# Patient Record
Sex: Female | Born: 1935 | Race: White | Hispanic: No | Marital: Married | Smoking: Never smoker
Health system: Southern US, Community
[De-identification: ages and names within clinical notes are randomized; demographics above are authoritative.]

## PROBLEM LIST (undated history)

## (undated) DIAGNOSIS — I10 Essential (primary) hypertension: Secondary | ICD-10-CM

## (undated) DIAGNOSIS — Z974 Presence of external hearing-aid: Secondary | ICD-10-CM

## (undated) DIAGNOSIS — K219 Gastro-esophageal reflux disease without esophagitis: Secondary | ICD-10-CM

## (undated) DIAGNOSIS — Z9989 Dependence on other enabling machines and devices: Secondary | ICD-10-CM

## (undated) DIAGNOSIS — Z972 Presence of dental prosthetic device (complete) (partial): Secondary | ICD-10-CM

## (undated) DIAGNOSIS — E7800 Pure hypercholesterolemia, unspecified: Secondary | ICD-10-CM

## (undated) DIAGNOSIS — T84498A Other mechanical complication of other internal orthopedic devices, implants and grafts, initial encounter: Secondary | ICD-10-CM

## (undated) DIAGNOSIS — K209 Esophagitis, unspecified without bleeding: Secondary | ICD-10-CM

## (undated) DIAGNOSIS — R42 Dizziness and giddiness: Secondary | ICD-10-CM

## (undated) DIAGNOSIS — R29898 Other symptoms and signs involving the musculoskeletal system: Secondary | ICD-10-CM

## (undated) HISTORY — PX: TOE AMPUTATION: SHX809

## (undated) HISTORY — PX: TONSILLECTOMY: SUR1361

## (undated) HISTORY — DX: Esophagitis, unspecified without bleeding: K20.90

## (undated) HISTORY — PX: BACK SURGERY: SHX140

## (undated) HISTORY — PX: ABDOMINAL SURGERY: SHX537

## (undated) HISTORY — PX: JOINT REPLACEMENT: SHX530

## (undated) HISTORY — PX: CHOLECYSTECTOMY: SHX55

## (undated) HISTORY — PX: REPLACEMENT TOTAL KNEE: SUR1224

## (undated) SURGERY — ESOPHAGOGASTRODUODENOSCOPY (EGD) WITH PROPOFOL
Anesthesia: Choice

---

## 2002-12-25 ENCOUNTER — Other Ambulatory Visit: Payer: Self-pay

## 2004-03-31 ENCOUNTER — Ambulatory Visit: Payer: Self-pay | Admitting: Internal Medicine

## 2004-04-02 ENCOUNTER — Ambulatory Visit (HOSPITAL_COMMUNITY): Admission: RE | Admit: 2004-04-02 | Discharge: 2004-04-02 | Payer: Self-pay | Admitting: Neurosurgery

## 2004-06-26 ENCOUNTER — Emergency Department: Payer: Self-pay | Admitting: Emergency Medicine

## 2004-07-25 ENCOUNTER — Inpatient Hospital Stay (HOSPITAL_COMMUNITY): Admission: RE | Admit: 2004-07-25 | Discharge: 2004-07-29 | Payer: Self-pay | Admitting: Orthopedic Surgery

## 2004-07-29 ENCOUNTER — Ambulatory Visit: Payer: Self-pay | Admitting: Internal Medicine

## 2004-08-17 ENCOUNTER — Encounter (HOSPITAL_COMMUNITY): Admission: RE | Admit: 2004-08-17 | Discharge: 2004-09-16 | Payer: Self-pay | Admitting: Orthopedic Surgery

## 2004-09-16 ENCOUNTER — Encounter (HOSPITAL_COMMUNITY): Admission: RE | Admit: 2004-09-16 | Discharge: 2004-10-16 | Payer: Self-pay | Admitting: Orthopedic Surgery

## 2005-04-12 ENCOUNTER — Emergency Department: Payer: Self-pay | Admitting: Emergency Medicine

## 2005-04-19 ENCOUNTER — Ambulatory Visit: Payer: Self-pay | Admitting: Internal Medicine

## 2005-04-26 ENCOUNTER — Ambulatory Visit: Payer: Self-pay | Admitting: Internal Medicine

## 2005-05-02 ENCOUNTER — Ambulatory Visit: Payer: Self-pay | Admitting: Internal Medicine

## 2005-05-09 ENCOUNTER — Ambulatory Visit: Payer: Self-pay | Admitting: Gastroenterology

## 2006-05-01 ENCOUNTER — Ambulatory Visit: Payer: Self-pay | Admitting: Internal Medicine

## 2006-05-17 ENCOUNTER — Ambulatory Visit: Payer: Self-pay | Admitting: Urology

## 2006-08-10 ENCOUNTER — Inpatient Hospital Stay (HOSPITAL_COMMUNITY): Admission: RE | Admit: 2006-08-10 | Discharge: 2006-08-16 | Payer: Self-pay | Admitting: Neurosurgery

## 2006-08-15 ENCOUNTER — Ambulatory Visit: Payer: Self-pay | Admitting: Physical Medicine & Rehabilitation

## 2006-08-16 ENCOUNTER — Inpatient Hospital Stay (HOSPITAL_COMMUNITY)
Admission: RE | Admit: 2006-08-16 | Discharge: 2006-08-23 | Payer: Self-pay | Admitting: Physical Medicine & Rehabilitation

## 2007-06-06 ENCOUNTER — Encounter: Payer: Self-pay | Admitting: Internal Medicine

## 2007-06-06 ENCOUNTER — Ambulatory Visit: Payer: Self-pay | Admitting: Internal Medicine

## 2007-06-21 ENCOUNTER — Encounter: Payer: Self-pay | Admitting: Internal Medicine

## 2007-07-22 ENCOUNTER — Encounter: Payer: Self-pay | Admitting: Internal Medicine

## 2007-08-06 ENCOUNTER — Encounter: Admission: RE | Admit: 2007-08-06 | Discharge: 2007-08-06 | Payer: Self-pay | Admitting: Neurosurgery

## 2007-08-06 ENCOUNTER — Ambulatory Visit: Payer: Self-pay | Admitting: Podiatry

## 2007-08-09 ENCOUNTER — Inpatient Hospital Stay: Payer: Self-pay | Admitting: Internal Medicine

## 2007-08-17 ENCOUNTER — Encounter: Payer: Self-pay | Admitting: Internal Medicine

## 2007-08-21 ENCOUNTER — Encounter: Payer: Self-pay | Admitting: Internal Medicine

## 2007-09-21 ENCOUNTER — Encounter: Payer: Self-pay | Admitting: Internal Medicine

## 2007-10-09 ENCOUNTER — Encounter: Admission: RE | Admit: 2007-10-09 | Discharge: 2007-10-09 | Payer: Self-pay | Admitting: Neurosurgery

## 2008-11-17 ENCOUNTER — Ambulatory Visit: Payer: Self-pay | Admitting: Internal Medicine

## 2009-11-08 ENCOUNTER — Ambulatory Visit: Payer: Self-pay | Admitting: Gastroenterology

## 2009-11-09 ENCOUNTER — Ambulatory Visit: Payer: Self-pay | Admitting: Gastroenterology

## 2009-11-09 LAB — PATHOLOGY REPORT

## 2009-11-20 ENCOUNTER — Ambulatory Visit: Payer: Self-pay | Admitting: Oncology

## 2009-12-10 ENCOUNTER — Ambulatory Visit: Payer: Self-pay | Admitting: General Surgery

## 2009-12-15 ENCOUNTER — Inpatient Hospital Stay: Payer: Self-pay | Admitting: General Surgery

## 2010-01-03 ENCOUNTER — Ambulatory Visit: Payer: Self-pay | Admitting: Oncology

## 2010-01-20 ENCOUNTER — Ambulatory Visit: Payer: Self-pay | Admitting: Oncology

## 2010-02-09 ENCOUNTER — Ambulatory Visit: Payer: Self-pay | Admitting: General Surgery

## 2010-03-18 ENCOUNTER — Ambulatory Visit: Payer: Self-pay | Admitting: General Surgery

## 2010-03-29 ENCOUNTER — Ambulatory Visit: Payer: Self-pay | Admitting: General Surgery

## 2010-06-02 ENCOUNTER — Ambulatory Visit: Payer: Self-pay | Admitting: Internal Medicine

## 2010-07-05 NOTE — Op Note (Signed)
[name], [name]              ACCOUNT NO.:  [id number]   MEDICAL RECORD NO.:  [id number]          PATIENT TYPE:  INP   LOCATION:  3172                         FACILITY:  [facility]   PHYSICIAN:  Payton Doughty, M.D.      DATE OF BIRTH:  Jan 04, 1933   DATE OF PROCEDURE:  08/10/2006  DATE OF DISCHARGE:                               OPERATIVE REPORT   PREOPERATIVE DIAGNOSIS:  Lumbar spondylosis   POSTOPERATIVE DIAGNOSIS:  Lumbar spondylosis.   OPERATIVE PROCEDURE:  L2-S1 total laminectomy; left-sided facetectomy at  L2-3, L3-4; segmental pedicle screw fixation from L2-S1; posterolateral  arthrodesis, L2-S1.   ANESTHESIA:  General endotracheal   PREP:  Betadine prep with alcohol wipe.   COMPLICATIONS:  Were dural lacerations which were repaired.   ASSISTANT:  Nurse assistant Basilia Jumbo, doctor assistant Venetia Maxon.   BODY OF TEXT:  This is a 77 year old layer lady with severe lumbar  spondylosis.  The planned procedure was decompression and interbody  fixation from L2-S1 followed by T9-S1 pedicle screw fixation.   The patient was taken to operative room, smoothly anesthetized,  intubated, and placed prone on the operating table.  Following shave,  prep and drape in the usual sterile fashion, skin was infiltrated with  1% lidocaine, and skin was incised from the bottom of L1 to S1, and the  lamina and transverse processes of L2, L3, L4, L5, S1 and the sacral ala  were exposed bilaterally in subperiosteal plane.  Intraoperative x-ray  confirmed correct level.  Starting at L2, the pars interarticularis  lamina and inferior facets of L2 and L3 were removed initially and  dissection was carried out.  It became obvious that adequate  decompression was not going to be obtained via this approach.  Therefore, total laminectomy of L2, L3, L4, and L5 was carried out with  dissection of the redundant ligamentum flavum.  Removal of the large  facet joints that were compressing the nerve roots was  accomplished with  a high-speed drill.  At L3-4 in the midline, there was a dural opening  when removing ligamentum flavum.  This was repaired primarily with 6-0  Prolene.  On the right side at L4-5, there was another dural laceration  that was quite large and quite lateral.  It was reapproximated with 6-0  Prolene, covered with Duragen and as well as Tisseel.  To Valsalva,  there was no leak.  Following complete decompression, it was obvious  that interbody devices could not be placed because of the ankylosis  across the disk spaces and because of inability to access the lateral  portion of the disk, because of the very thin nature of the patient's  dura.  It was therefore elected to complete the decompression and place  pedicle screws from L2 to S1.  The pedicle screws were placed without  difficulty.  Intraoperative x-ray showed good placement of screws.  They  were attached to the rods and capped.  The portion of the case that was  extended up to T9 was not carried out because of the significant  ankylosis and auto fusion of the facette joints  rendered further fusion  superfluous.  The wound was therefore irrigated.  The transverse process  and sacral ala decorticated with a high-  speed drill and packed with BMP on the extender matrix and the patient's  own bone.  Successive layers of 0 Vicryl, 2-0 Vicryl, 3-0 nylon were  used to close, and Betadine Telfa dressing was applied.  The patient  returned to the recovery room in good condition.           ______________________________  Payton Doughty, M.D.     MWR/MEDQ  D:  08/10/2006  T:  08/11/2006  Job:  [id number]

## 2010-07-05 NOTE — H&P (Signed)
NAMEKATLIN, [name]              ACCOUNT NO.:  [id number]   MEDICAL RECORD NO.:  [id number]          PATIENT TYPE:  INP   LOCATION:  3172                         FACILITY:  [facility]   PHYSICIAN:  Payton Doughty, M.D.      DATE OF BIRTH:  [birth year]   DATE OF ADMISSION:  08/10/2006  DATE OF DISCHARGE:                              HISTORY & PHYSICAL   ADMISSION DIAGNOSIS:  Lumbar spondylosis with significant spinal cord  compression.  She has scoliosis at 2-3, 3-4 and 4-5.   HISTORY OF PRESENT ILLNESS:  This is a very nice, 77 year old, right-  handed, white lady I visited with several years ago.  She has severe  spinal stenosis.  She has gotten her knee replaced and reported back  with severe complaints in the lower extremities.  She cannot walk  without a cane.  She cannot get around well with one.  Her symptoms are  those of neurogenic claudication.   PAST MEDICAL HISTORY:  Remarkable for hypertension.   MEDICATIONS:  Cozaar, diclofenac, Lipitor, gemfibrozil, aspirin and  hydrocodone.   ALLERGIES:  TETRACYCLINE.   PHYSICAL EXAMINATION:  HEENT:  Exam within normal limits.  NECK:  She has good range of motion in her neck.  CHEST:  Clear.  CARDIAC:  Exam is regular rate and rhythm.  ABDOMEN:  Slightly large but nontender with no hepatosplenomegaly.  EXTREMITIES:  Without clubbing or cyanosis.  GU:  Exam deferred.  PULSES:  Peripheral pulses are good.  NEUROLOGIC:  She is awake, alert and oriented.  Cranial nerves are  intact.  Motor exam shows 5/5 strength in the lower extremities on  confrontational testing.  Sensory dysesthesias described in an L3, L4  and L5 distribution.  Reflexes are absent in the lower extremities.   She comes in accompanied with an MRI which demonstrates severe spinal  stenosis at L1-2, L2-3, L3-4 and L5-S1, slightly tight at 5-1.  There is  scoliosis at 2-3, 3-4 and 4-5.   CLINICAL IMPRESSION:  Lumbar spondylosis which is severe with neurogenic  claudication.   PLAN:  The plan is to fuse from L2-3 to L5-S1 with pedicle screws and  cages, and then from T9 down to L2 also with pedicle screws.  The risks  and benefits of this approach have been discussed with her and she  wished to proceed.           ______________________________  Payton Doughty, M.D.     MWR/MEDQ  D:  08/10/2006  T:  08/10/2006  Job:  [id number]

## 2010-07-05 NOTE — Discharge Summary (Signed)
NAMEKIMILA, PAPALEO              ACCOUNT NO.:  [id number]   MEDICAL RECORD NO.:  [id number]          PATIENT TYPE:  IPS   LOCATION:  4025                         FACILITY:  [facility]   PHYSICIAN:  Ranelle Oyster, M.D.DATE OF BIRTH:  17-Mar-1932   DATE OF ADMISSION:  08/16/2006  DATE OF DISCHARGE:  08/23/2006                               DISCHARGE SUMMARY   DISCHARGE DIAGNOSES:  1. Lumbar spondylosis status post lumbar L2-S1 laminectomy August 10, 2006, pain management.  2. Non-insulin dependent diabetes mellitus.  3. Postoperative anemia.  4. Hyponatremia.  5. Hyperlipidemia.  6. Hypertension.  7. History of a right total knee replacement 2006.   HISTORY OF PRESENT ILLNESS:  This is a 77 year old right-handed white  female admitted June 20, admitted with progressive low back pain  radiating to the lower extremities.  X-rays and imaging showed advanced  lumbar spondylosis lumbar L2-S1.  Underwent lumbar L2-S1 total  laminectomy left-sided fasciotomy at lumbar L2-3 L3-4, August 10, 2006 per  Dr. Channing Mutters.  Back brace when out of bed.  Postoperative anemia 6.1,  transfused hemoglobin improved to 10.6. Cipro added June 26, for wound  coverage.   PAST MEDICAL HISTORY:  See discharge diagnoses.  No alcohol or tobacco.   ALLERGIES:  TETRACYCLINE.   SOCIAL HISTORY:  Lives with husband in one level home, two steps to  entry. Husband with prostate cancer but can assist.   MEDICATIONS PRIOR TO ADMISSION:  1. Diclofenac 75 mg twice daily.  2. Hydrocodone as needed.  3. Lipitor 80 mg daily.  4. Lopid 600 mg twice daily.  5. Hyoscyamine 0.375 mg twice daily as needed.  6. Actos 45 mg daily.  7. Cozaar 100 mg daily.   REHABILITATION HOSPITAL COURSE:  The patient was admitted to inpatient  rehab services with therapies initiated on a 3-hour daily basis  consisting of physical therapy, occupational therapy and rehabilitation  nursing. The following issues were addressed during the  patient's  rehabilitation stay.  Pertaining to Mrs. Royer lumbar spondylosis with  lumbar L2-S1 laminectomy August 10, 2006, surgical site healing nicely.  She was on Cipro through August 23, 2006, for wound coverage.  She was to  be wearing a back brace when out of bed.  Functionally she was  ambulating household distances with an assistive device.  Pain control  ongoing with the use of oxycodone. She had been on Celebrex but this was  held.  Blood sugars with fair control on Actos 45 mg daily.  Postoperative anemia.  Latest hemoglobin of 10, hematocrit 30.  She  remained on iron supplement.  Mild hyponatremia 128, 132, close  monitoring of intake and output.  She had some mild constipation that  was resolved with laxative assistance.  Blood pressures monitored with  Cozaar 100 mg daily.  She will continue on her Lipitor and Lopid for  hyperlipidemia.   Latest labs showed a sodium 134, potassium 3.4, BUN 9, creatinine 0.7.  Latest hemoglobin of 10, hematocrit 30.4.   DISCHARGE MEDICATIONS:  At time of dictation included  1. Lipitor 80 mg daily.  2.  Lopid 600 mg twice daily.  3. Cozaar 100 mg daily.  4. Actos 45 mg daily.  5. Trinsicon 1 capsule twice daily.  6. MiraLax 17 grams daily with 8 ounces of water.  7. Oxycodone 5 mg one or two tablets every 4 hours as needed pain      dispense of 90 tablets.   DIET:  Was diabetic diet.   WOUND CARE:  Cleanse incision daily with soap and water, monitor for any  increased redness, drainage or fever. Follow up with Dr. Trey Sailors a [phone], neurosurgery, Dr. Judithann Sheen medical management.   SPECIAL INSTRUCTIONS:  Back brace when out of bed.      Mariam Dollar, P.A.      Ranelle Oyster, M.D.  Electronically Signed    DA/MEDQ  D:  08/22/2006  T:  08/23/2006  Job:  [id number]   cc:   Payton Doughty, M.D.  Aram Beecham, M.D.

## 2010-07-05 NOTE — H&P (Signed)
NAMEDOROTEA, HAND              ACCOUNT NO.:  [id number]   MEDICAL RECORD NO.:  [id number]          PATIENT TYPE:  IPS   LOCATION:  4025                         FACILITY:  [facility]   PHYSICIAN:  Ellwood Dense, M.D.   DATE OF BIRTH:  06-Sep-1932   DATE OF ADMISSION:  08/16/2006  DATE OF DISCHARGE:                              HISTORY & PHYSICAL   HISTORY OF PRESENT ILLNESS:  The patient is a 77 year old, right-handed,  Caucasian female admitted 08/10/2006, with history of right total knee  replacement in 2006. She was admitted for progressive low back pain with  radiating lower-extremity symptoms. X-rays and imaging had shown  advanced lumbar spondylosis at L2-S1. The patient underwent L2-S1 total  laminectomy along with left-sided facetectomy at L2-3 and L3-4 performed  08/10/06, by Dr. Channing Mutters. The patient was placed in a back brace when out of  bed. She was allowed to don and doff the brace at the edge of the bed.  Postoperative anemia developed with hemoglobin of 6.1. She was  transfused 2 units of packed red blood cells with improvement to 10.6.  Ciprofloxacin was added 08/16/06, for wound coverage. She continues to  have a Foley in place.   The patient was evaluated by the rehabilitation physicians and felt to  be an appropriate candidate for inpatient rehabilitation.   REVIEW OF SYSTEMS:  Positive for anxiety and lumbago.   PAST MEDICAL HISTORY:  1. Hypertension.  2. Noninsulin dependent diabetes mellitus/  3. Dyslipidemia.  4. Anxiety.  5. Right total knee replacement, 2006.  6. Prior cholecystectomy.   FAMILY HISTORY:  Positive for coronary artery disease.   SOCIAL HISTORY:  The patient lives with her husband who can assist as  needed. Her husband has been treated for prostate cancer but is  functionally able to help her. They lives in a 1-level home with 2 steps  to enter. The patient does not use alcohol or tobacco.   FUNCTIONAL HISTORY PRIOR TO ADMISSION:   Independent.   ALLERGIES:  TETRACYCLINE.   MEDICATIONS:  Prior to admission  1. Diclofenac 75 mg b.i.d.  2. Hydrocodone p.r.n.  3. Lipitor 40 mg 2 tablets daily.  4. Lopid 600 mg b.i.d.  5. Hyoscine 0.375 mg b.i.d. p.r.n.  6. Actos 45 mg.  7. Cozaar 100 mg daily.   LABORATORY DATA:  Recent hemoglobin was 10.6 with hematocrit of 31.6,  platelet count of 96,000 and white count of 8.6. Recent sodium was 136,  potassium 3.8, chloride 108, CO2 24, BUN 11 and creatinine 0.8.   PHYSICAL EXAMINATION:  GENERAL APPEARANCE: Reasonably well-appearing,  elderly, large adult female lying in bed in mild to no acute discomfort.  VITAL SIGNS: Blood pressure 120/70 with a pulse of  95, respiratory rate  18 and temperature 99.0.  HEENT: Normocephalic, atraumatic.  CARDIOVASCULAR: Regular rate and rhythm. S1 and S2 without murmurs.  ABDOMEN: The abdomen was soft, obese, nontender with positive bowel  sounds.  LUNGS: The lungs were clear to auscultation bilaterally.  NEUROLOGIC: Alert and oriented x3. Cranial nerves II through XII are  intact.  EXTREMITIES: Bilateral upper-extremity exam showed 4+/5  strength  throughout. Bulk and tone were normal and reflexes were 2+ and  symmetrical. Sensation was intact to light touch to the bilateral upper  extremities. Lower-extremity exam showed hip flexion, extension, ankle  dorsiflexion at 3-/5. Bulk and tone were normal. Reflexes were decreased  in the bilateral lower extremities. Sensation was intact.  BACK: Examination of her lumbar area showed well-healing wound with dry  dressing.   IMPRESSION:  1. Status post lumbar laminectomy/facetectomy for lumbar spondylosis      with myelopathy L2-S1 performed 08/10/06, by Dr. Channing Mutters.  2. Postop pain control using oxycodone immediate release and Celebrex      200 mg b.i.d.  3. Noninsulin dependent diabetes mellitus on Actos 45 mg daily and      high-carbohydrate modified diet.  4. Dyslipidemia on Lipitor and  Lopid daily.  5. Hypertension on Cozaar 100 mg orally daily.   Presently, the patient has deficits in ADLs, transfers and ambulation  related to the above-noted lumbar laminectomy and facetectomy.   PLAN:  1. Admit to the rehabilitation unit for daily physical therapy for      range of motion, strengthening, bed mobility, transfers, pre-gait      training, gait training and equipment eval.  2. Occupational therapy for range of motion, strengthening, ADLs,      cognitive/perceptual training, splinting and equipment eval.  3. Rehab nursing for skin care, wound care and bowel and bladder      training as necessary.  4. Case management to assess home environment, assist with discharge      planning, arrange for appropriate follow-up care.  5. Social worker to assess family and social support and assist in      discharge planning.  6. Continue high-carbohydrate modified diet with CPGs a.c. and at      bedtime, Actos 45 mg orally daily.  7. Lipitor 80 mg orally daily with Lopid 600 mg orally b.i.d.  8. Check admission lab including CBC and __________  Friday, August 17, 2006.  9. Binder and Hypafix tape b.i.d. to the back incision, change twice a      day.  10.DC Foley in a.m. 08/17/06.  11.Continue ciprofloxacin 500 mg orally q. 12 h x 7 days.  12.Routine turning to prevent skin breakdown.  13.Levsinex 0.375 mg orally b.i.d. p.r.n.  14.Senokot S 2 tablets orally at bedtime.  15.Monitor hypertension on Cozaar 100 mg orally daily.  16.DC IV fluids if not already done.  17.The patient can don and doff back brace at the edge of the bed and      the patient to have the back brace on when out of bed.  18.Hand-held nebulizer with albuterol 2.5 mg q.i.d. p.r.n.  19.Oxycodone 5 mg 1 to 2 tablets orally q. 4 h p.r.n. for pain in      addition to above-noted Celebrex.   PROGNOSIS:  Good.   ESTIMATED LENGTH OF STAY:  Seven to twelve days.  GOALS:  Modified independent, ADLs, transfers and  ambulation household  distances.           ______________________________  Ellwood Dense, M.D.     DC/MEDQ  D:  08/16/2006  T:  08/16/2006  Job:  [id number]

## 2010-07-08 NOTE — Op Note (Signed)
NAMEROSINA, [name]              ACCOUNT NO.:  [id number]   MEDICAL RECORD NO.:  [id number]          PATIENT TYPE:  INP   LOCATION:  1517                         FACILITY:  [facility]   PHYSICIAN:  [name], M.D.    DATE OF BIRTH:  08-21-1932   DATE OF PROCEDURE:  07/25/2004  DATE OF DISCHARGE:                                 OPERATIVE REPORT   PREOPERATIVE DIAGNOSIS:  Osteoarthritis, right knee.   POSTOPERATIVE DIAGNOSIS:  Osteoarthritis, right knee.   PROCEDURE:  Right total knee arthroplasty.   SURGEON:  [name], M.D.   ASSISTANT:  Alexzandrew L. Julien Girt, P.A.   ANESTHESIA:  General with postop Marcaine pain pump.   ESTIMATED BLOOD LOSS:  Minimal.   DRAINS:  Hemovac x1.   TOURNIQUET TIME:  44 minutes at 300 mmHg.   COMPLICATIONS:  None.   CONDITION:  Stable to recovery.   CLINICAL NOTE:  [name] is a 77 year old female with end-stage  osteoarthritis of both knees, right more symptomatic than the left. She has  failed nonoperative management and presents now for total knee arthroplasty.   PROCEDURE IN DETAIL:  After successful administration of general anesthetic,  a tourniquet was placed high on the right thigh, right lower extremity  prepped and draped in the usual sterile fashion. Extremities wrapped in  Esmarch, knee flexed and tourniquet inflated at 300 mmHg. Standard midline  incision made with a 10 blade through subcutaneous tissue to the level of  the extensor mechanism. Fresh blade is used to make a medial parapatellar  arthrotomy and the soft tissue of the proximal medial tibia is  subperiosteally elevated to the joint line with the knife and into the  semimembranosus bursa with a Cobb elevator. Soft tissue of the proximal  lateral tibia is also elevated with attention being paid to avoid the  patellar tendon on tibial tubercle. Patella is everted, knee flexed 90  degrees. ACL and PCL removed. Drill was used to create a starting hole in  the  distal femur and the canal was irrigated. Five degrees right valgus  alignment guide was placed and, referencing off the posterior condyles,  rotations marked and the block pinned to remove 10 mm of the distal femur.  Distal femoral resection is made with an oscillating saw. Sizing blocks  placed and a size 3 is most appropriate. Rotations marked off the  epicondylar axis. Size 3 cutting block is placed and then the anterior,  posterior and chamfer cuts were made.   Tibia was then subluxed forward and the menisci removed. Extramedullary  tibial alignment guides placed referencing proximally at the medial aspect  of the tibial tubercle and distally along the second metatarsal axis and  tibial crest. The block is pinned to remove 10 mm off the proximal tibia. We  used the lateral side as our reference. The tibial resection is made with an  oscillating saw. A 2.5 is the most appropriate size for the tibial component  and the proximal tibia was prepared with the modular drill and keel punch  for size 2.5. Femoral preparation was completed with the intercondylar cut  for the size 3.   The size 2.5 mobile bearing tibial trial, size 3 posterior stabilized  femoral trial, and a  10 mm posterior stabilized rotating platform insert  trial are placed. With the 10, full extension is achieved with excellent  varus and valgus balance throughout full range of motion. The patella was  then everted and thickness measured to be 22 mm. Freehand resection is taken  to 13 mm, 35 template is placed, lug holes were drilled, trial patella was  placed and it tracks normally. The osteophytes were then removed off the  posterior femur with the trial in place. All trials were removed and the cut  bone surfaces are prepared with pulsatile lavage. Cement is mixed and once  ready for implantation, a size 2.5 mobile bearing tibial tray, size 3  posterior stabilized femur, and 35 patella are cemented into place.  Patella  was held the clamp. Trial 10 mm inserts placed and knee held in full  extension and all extruded cement removed. Once the cement fully hardened,  then a permanent 10 mm posterior stabilized rotating platform insert is  placed with the tibial tray. The wound was copiously irrigated with saline  solution. The extensor mechanism closed over Hemovac drain with interrupted  #1 PDS. Flexion against gravity is 130 degrees. Tourniquets is released  after a total time of 44 minutes. Subcu is then closed with interrupted 2-0  Vicryl. Catheter for Marcaine pain pump was placed and the pump initiated.  Subcuticular was closed with running 4-0 Monocryl and the incisions cleaned  and dried and Steri-Strips and bulky sterile dressing applied. Drains hooked  to suction. She is placed into a knee immobilizer, awakened and transported  to recovery in stable condition.       FA/MEDQ  D:  07/25/2004  T:  07/25/2004  Job:  [id number]

## 2010-07-08 NOTE — H&P (Signed)
[name], [name]              ACCOUNT NO.:  [id number]   MEDICAL RECORD NO.:  [id number]          PATIENT TYPE:  INP   LOCATION:  NA                           FACILITY:  [facility]   PHYSICIAN:  Ollen Gross, M.D.    DATE OF BIRTH:  Dec 05, 1932   DATE OF ADMISSION:  07/25/2004  DATE OF DISCHARGE:                                HISTORY & PHYSICAL   CHIEF COMPLAINT:  Right knee pain.   HISTORY OF PRESENT ILLNESS:  The patient is a 77 year old female referred  over by Dr. Trey Sailors for bilateral knee pain. Ms. Hooper is a 77 year old  female who has had bilateral knee pain, right more symptomatic than left.  She has been followed by Dr. Channing Mutters for a lumbar stenosis but has also had knee  problems for some time now. She feels like her knees are the main thing  preventing her from doing what she would like to do. She has lost some  mobility and unable to walk distances, both because of the spinal stenosis  and her knee problems. The right is more symptomatic. She is seen in the  office by Dr. Lequita Halt and found to have severe end stage arthritic changes  with tri-compartmental changes in the right knee with bone-on-bone. It has  been progressive in nature and it is felt that she could benefit from  undergoing knee replacement. Risks and benefits have been discussed. The  patient is subsequently admitted to the hospital.   ALLERGIES:  TETRACYCLINE.   CURRENT MEDICATIONS:  Aceon 8 mg 1 and 1/2 tablet daily, Lipitor 40 mg 2  tablets, Diclofenac twice a day and stop prior to surgery. Gemfibrozil 600  mg twice a day.   PAST MEDICAL HISTORY:  Episodic anxiety, hypertension, skin cancers, spinal  stenosis, post menopausal and recent left ankle sprain. Borderline diabetes.   PAST SURGICAL HISTORY:  Sinus surgery and gallbladder surgery.   FAMILY HISTORY:  Heart disease and diabetes.   SOCIAL HISTORY:  Married, retired, non-smoker. No alcohol. One child.  Husband will be assisting with  care.   REVIEW OF SYSTEMS:  GENERAL:  No fever, chills, night sweats. NEUROLOGIC:  No seizures, syncope, paralysis. RESPIRATORY:  No shortness of breath,  productive cough, or hemoptysis. CARDIOVASCULAR:  No chest pain, angina,  orthopnea. GASTROINTESTINAL:  No nausea and vomiting. No diarrhea or  constipation. GENITOURINARY:  No dysuria, hematuria, or discharge.  MUSCULOSKELETAL:  Right knee.   PHYSICAL EXAMINATION:  VITAL SIGNS:  Pulse 76, respiratory rate 14, blood  pressure 158/78.  GENERAL:  A 77 year old white female, well developed, well nourished, short  stature, and in no acute distress. She is alert, oriented and cooperative.  HEENT:  Normocephalic and atraumatic. Pupils are equal, round, and reactive.  Oropharynx clear. Extraocular muscles intact.  NECK:  Supple.  CHEST:  Clear anterior posterior chest wall.  LUNGS:  No rhonchi, rales, or wheezes.  HEART:  Regular rate and rhythm. No murmurs.  ABDOMEN:  Soft, nontender. Bowel sounds present. Slightly round.  RECTAL/BREAST/GENITALIA:  Not done. Not pertinent to present illness.  EXTREMITIES:  Right knee shows range of  motion  only of about 10 to about 95  degrees. There is a varus deformity, malalignment, with marked crepitus on  passive range of motion, greater tender more so medially than laterally.   IMPRESSION:  1.  Osteoarthritis, right knee.  2.  Episodic anxiety.  3.  Hypertension.  4.  Borderline diabetes.  5.  History of skin cancers.  6.  Spinal stenosis.  7.  Post menopausal.  8.  Recent left ankle sprain.   PLAN:  The patient admitted to [facility] to undergo  right total knee arthroplasty. The patient has been seen preoperatively by  Dr. Judithann Sheen, her medical physician, and is felt stable and clear for further.  The patient is subsequently admitted to the hospital.      ALP/MEDQ  D:  07/24/2004  T:  07/24/2004  Job:  [id number]   cc:   Ollen Gross, M.D.  [facility]  [address]  [address]  [place]  [place] [address]  Fax: [phone]   Aram Beecham, M.D.

## 2010-07-08 NOTE — Discharge Summary (Signed)
[name], [name]              ACCOUNT NO.:  [id number]   MEDICAL RECORD NO.:  [id number]          PATIENT TYPE:  INP   LOCATION:  1517                         FACILITY:  [facility]   PHYSICIAN:  Ollen Gross, M.D.    DATE OF BIRTH:  22-Apr-1932   DATE OF ADMISSION:  07/25/2004  DATE OF DISCHARGE:                                 DISCHARGE SUMMARY   ADMITTING DIAGNOSES:  1.  Osteoarthritis right knee.  2.  Episodic anxiety.  3.  Hypertension.  4.  Borderline diabetes.  5.  History of skin cancers.  6.  Spinal stenosis.  7.  Postmenopausal.  8.  Recent left ankle sprain.   DISCHARGE DIAGNOSES:  1.  Osteoarthritis right knee status post right total knee arthroplasty.  2.  Postoperative blood loss anemia, did not require transfusion.  3.  Postoperative hyponatremia, improved.  4.  Episodic anxiety.  5.  Hypertension.  6.  Borderline diabetes.  7.  History of skin cancers.  8.  Spinal stenosis.  9.  Postmenopausal.  10. Recent left ankle sprain.   PROCEDURE:  July 25, 2004 - right total knee arthroplasty. Surgeon:  Dr.  Homero Fellers Aluisio. Assistant:  Avel Peace, P.A.-C. Anesthesia:  General,  postoperative Marcaine. Minimal blood loss. Hemovac drain x1. Tourniquet  time 44 minutes at 300 mmHg.   CONSULTS:  None.   BRIEF HISTORY:  [name] is a 77 year old female with end-stage arthritis  of both knees, right more symptomatic than left. Failed nonoperative  management and now presents for total knee arthroplasty.   LABORATORY DATA:  CBC on admission:  Hemoglobin of 13.6, hematocrit of 40.3,  white cell count 4.2, differential all within normal limits. Postoperative  hemoglobin 9.4. She was 8.4 and then rechecked and it was last noted at 8.1.  She was asymptomatic. PT/PTT on admission 12.6 and 42 respectively with INR  of 1.0. Serial protimes followed. Last noted PT/INR 23.4 and 2.9. Chem panel  on admission:  Elevated glucose of 183, remaining Chem panel within normal  limits. Serial BMETs were followed. Sodium did drop from 140 to 127 back up  to 136. Glucose dropped from 183 to 142. Calcium dropped from 9.3 to 8.1  back up to 8.4. Urinalysis:  Moderate hemoglobin, moderate leukocyte  esterase, many epithelials, white cells 11-20, red cells 3-6 (treated  preoperatively). Blood group/type A negative.   Chest x-ray on Jul 15, 2004:  No abnormalities, mild bronchitic changes. EKG  dated Jul 14, 2004:  Normal sinus rhythm, possible left atrial enlargement,  unconfirmed.   HOSPITAL COURSE:  Admitted to [facility], taken to the OR,  underwent above-stated procedure without complication. The patient tolerated  the procedure well, later to the recovery room and then the orthopedic  floor. The patient started on PCA and p.o. analgesics. By day #1 had weaned  her to p.o. medications, was doing fairly well, had a little bit of  hyponatremia, fluids were made KVO and changed to normal saline. Hemovac  drain pulled day #2, she was doing a little bit better. Sodium was back up  to 133. Dressing was changed,  incision looked good, pain pump was removed.  She started ambulating with physical therapy. She did have a little bit of  hypokalemia mild lower-normal region. She was placed on some K-Dur and it  came back up to a higher level. The patient wanted to look into [place]  for her therapy. Discharge planning was consulted to assist with placement.  By day #3 she was doing a little bit better, had a little bit of  lightheadedness on the afternoon before but she was doing well by day #3.  Dressing looked good. On day #4 she was doing well and seen in rounds by Dr.  Lequita Halt. Arrangements were trying to be made for continued rehab at [facility] facility. Disposition is pending at this time. Arrangements were being  made and discharge summary dictated.   DISCHARGE PLAN:  1.  The patient will be tentatively transferred to [facility] facility for       continued care on July 29, 2004.  2.  Discharge diagnoses:  Please see above.  3.  Discharge medications:  Current medications at time of transfer include:      1.  Lopid 600 mg p.o. b.i.d.      2.  Lipitor 80 mg p.o. daily.      3.  Mavik 6 mg p.o. daily.      4.  Colace 100 mg p.o. b.i.d.      5.  Coumadin protocol. She needs to be on Coumadin for a total 3 weeks,          initiated on July 26, 2004. Titrate the INR between 2.0 and 3.0.      6.  Trinsicon one p.o. b.i.d.      7.  Senokot-S p.o. p.r.n.      8.  Percocet one or two q.4-6h. as needed for pain.          1.  Tylenol one or two q.4-6h. as needed for mild pain, temperature,              or headache.      9.  Robaxin 500 mg p.o. q.6h. p.r.n. spasm.      10. Benadryl 25-50 mg p.o. q.h.s. p.r.n. sleep.      11. Reglan 10 mg p.o. q.8h. p.r.n. nausea.  4.  Diet:  Low sodium diet, modified carbohydrate diet, diabetic diet.  5.  Activity:  She is full weightbearing to the right lower extremity.      Continue gait training ambulation and ADLs as per PT and OT at [facility]      facility. She needs to be out of bed minimum b.i.d., ambulating b.i.d.      Daily dressing changes. May start showering by total knee protocol.  6.  Follow up 2 weeks from surgery. Call the office for an appointment at      [phone] to arrange and appointment time and transfer the patient.   DISPOSITION:  Pending. Tentative plan is go to [facility] facility.   CONDITION UPON DISCHARGE:  Improving.       ALP/MEDQ  D:  07/29/2004  T:  07/29/2004  Job:  [id number]   cc:   To [facility] with the patient   Aram Beecham, M.D.

## 2010-07-08 NOTE — Discharge Summary (Signed)
[name], [name]              ACCOUNT NO.:  [id number]   MEDICAL RECORD NO.:  [id number]          PATIENT TYPE:  INP   LOCATION:  3111                         FACILITY:  [facility]   PHYSICIAN:  Payton Doughty, M.D.      DATE OF BIRTH:  1933/01/15   DATE OF ADMISSION:  08/10/2006  DATE OF DISCHARGE:  08/16/2006                               DISCHARGE SUMMARY   Transferred to the rehab service August 16, 2006.   ADMITTING DIAGNOSIS:  Spondylosis L2-S1.   DISCHARGE DIET:  Spondylosis L2-S1.   OPERATIVE PROCEDURES:  L2-S1 segmental pedicle screw fixation and L2-S1  posterolateral arthrodesis and L2-S1 laminectomy.   SERVICE:  Neurosurgery.   COMPLICATIONS:  CSF leak.   DISCHARGE STATUS:  Alive and well.   This is a 77 year old lady whose History and Physical is recounted on  the chart.  She basically had severe spondylosis and neurogenic  claudication that was limiting her ability to ambulate for more than a  few feet, and she could not walk at all without a cane.   PAST MEDICAL HISTORY:  Remarkable for hypertension.   MEDICATIONS:  Cozaar, diclofenac, Lipitor, gemfibrozil, aspirin,  hydrocodone.   ALLERGIES:  TETRACYCLINE.   PHYSICAL EXAMINATION:  GENERAL:  Intact.  NEUROLOGIC:  Exam was intact while she was stationary.  Once she stood  up, she developed weakness in both lower extremities.   She was admitted after ascertaining normal laboratory values and taken  to operating room and underwent L2-S1 decompression.  There were several  CSF leaks encountered. These were repaired primarily.  She also had a L2-  S1 fusion and posterolateral arthrodesis.   Postoperatively, she had dorsiflexion weakness on the left side.  She  was kept flat in bed for 3 days and when mobilized had no CSF leak, no  headache.  June 24, she was sitting up, doing well in physical therapy.  On June 25,  PT recommended she pursue inpatient rehab.  She was visited  by the rehab service who felt she was  appropriate candidate and was  transferred to the rehab service August 16, 2006.   Her followup will be in [facility] offices for suture removal.    .           ______________________________  Payton Doughty, M.D.     MWR/MEDQ  D:  10/09/2006  T:  10/09/2006  Job:  [id number]

## 2010-12-06 LAB — BASIC METABOLIC PANEL
BUN: 6
BUN: 9
CO2: 27
CO2: 27
Calcium: 8.4
Calcium: 8.5
Chloride: 101
Chloride: 101
Creatinine, Ser: 0.69
Creatinine, Ser: 0.77
GFR calc Af Amer: >60
GFR calc Af Amer: >60
GFR calc non Af Amer: >60
GFR calc non Af Amer: >60
Glucose, Bld: 124 — ABNORMAL HIGH
Glucose, Bld: 128 — ABNORMAL HIGH
Potassium: 3.4 — ABNORMAL LOW
Potassium: 3.7
Sodium: 134 — ABNORMAL LOW
Sodium: 134 — ABNORMAL LOW

## 2010-12-07 LAB — BASIC METABOLIC PANEL
BUN: 11
BUN: 12
BUN: 16
CO2: 24
CO2: 27
CO2: 28
Calcium: 7.7 — ABNORMAL LOW
Calcium: 8.3 — ABNORMAL LOW
Calcium: 8.4
Chloride: 108
Chloride: 95 — ABNORMAL LOW
Chloride: 98
Creatinine, Ser: 0.76
Creatinine, Ser: 0.78
Creatinine, Ser: 0.82
GFR calc Af Amer: >60
GFR calc Af Amer: >60
GFR calc Af Amer: >60
GFR calc non Af Amer: >60
GFR calc non Af Amer: >60
GFR calc non Af Amer: >60
Glucose, Bld: 121 — ABNORMAL HIGH
Glucose, Bld: 124 — ABNORMAL HIGH
Glucose, Bld: 179 — ABNORMAL HIGH
Potassium: 3.2 — ABNORMAL LOW
Potassium: 3.2 — ABNORMAL LOW
Potassium: 3.8
Sodium: 128 — ABNORMAL LOW
Sodium: 132 — ABNORMAL LOW
Sodium: 136

## 2010-12-07 LAB — POCT I-STAT GLUCOSE
Glucose, Bld: 115 — ABNORMAL HIGH
Operator id: 146111

## 2010-12-07 LAB — COMPREHENSIVE METABOLIC PANEL
ALT: 14
AST: 16
Albumin: 1.7 — ABNORMAL LOW
Alkaline Phosphatase: 69
BUN: 17
CO2: 27
Calcium: 8.5
Chloride: 96
Creatinine, Ser: 0.85
GFR calc Af Amer: >60
GFR calc non Af Amer: >60
Glucose, Bld: 137 — ABNORMAL HIGH
Potassium: 4.6
Sodium: 128 — ABNORMAL LOW
Total Bilirubin: 0.6
Total Protein: 4.3 — ABNORMAL LOW

## 2010-12-07 LAB — POCT I-STAT 4, (NA,K, GLUC, HGB,HCT)
Glucose, Bld: 112 — ABNORMAL HIGH
Glucose, Bld: 125 — ABNORMAL HIGH
Glucose, Bld: 131 — ABNORMAL HIGH
Glucose, Bld: 139 — ABNORMAL HIGH
HCT: 18 — ABNORMAL LOW
HCT: 25 — ABNORMAL LOW
HCT: 27 — ABNORMAL LOW
HCT: 29 — ABNORMAL LOW
Hemoglobin: 6.1 — CL
Hemoglobin: 8.5 — ABNORMAL LOW
Hemoglobin: 9.2 — ABNORMAL LOW
Hemoglobin: 9.9 — ABNORMAL LOW
Operator id: 146111
Operator id: 146111
Operator id: 146111
Operator id: 151301
Potassium: 4
Potassium: 4
Potassium: 4.3
Potassium: 4.4
Sodium: 138
Sodium: 138
Sodium: 138
Sodium: 139

## 2010-12-07 LAB — DIFFERENTIAL
Basophils Absolute: 0
Basophils Relative: 0
Eosinophils Absolute: 0.2
Eosinophils Relative: 5
Lymphocytes Relative: 14
Lymphs Abs: 0.6 — ABNORMAL LOW
Monocytes Absolute: 0.7
Monocytes Relative: 15 — ABNORMAL HIGH
Neutro Abs: 2.9
Neutrophils Relative %: 65

## 2010-12-07 LAB — CBC
HCT: 27.1 — ABNORMAL LOW
HCT: 30 — ABNORMAL LOW
HCT: 30 — ABNORMAL LOW
HCT: 31.6 — ABNORMAL LOW
Hemoglobin: 10 — ABNORMAL LOW
Hemoglobin: 10.1 — ABNORMAL LOW
Hemoglobin: 10.6 — ABNORMAL LOW
Hemoglobin: 9.1 — ABNORMAL LOW
MCHC: 33.4
MCHC: 33.5
MCHC: 33.6
MCHC: 33.7
MCV: 85.6
MCV: 86.2
MCV: 87.5
MCV: 87.6
Platelets: 176
Platelets: 273
Platelets: 96 — ABNORMAL LOW
Platelets: 98 — ABNORMAL LOW
RBC: 3.14 — ABNORMAL LOW
RBC: 3.42 — ABNORMAL LOW
RBC: 3.51 — ABNORMAL LOW
RBC: 3.61 — ABNORMAL LOW
RDW: 15.6 — ABNORMAL HIGH
RDW: 15.6 — ABNORMAL HIGH
RDW: 15.8 — ABNORMAL HIGH
RDW: 15.8 — ABNORMAL HIGH
WBC: 4.4
WBC: 7.5
WBC: 8
WBC: 8.6

## 2010-12-07 LAB — POCT I-STAT 7, (LYTES, BLD GAS, ICA,H+H)
Acid-Base Excess: 1
Acid-base deficit: 2
Bicarbonate: 22.9
Bicarbonate: 26.2 — ABNORMAL HIGH
Calcium, Ion: 1.23
Calcium, Ion: 1.24
HCT: 18 — ABNORMAL LOW
HCT: 27 — ABNORMAL LOW
Hemoglobin: 6.1 — CL
Hemoglobin: 9.2 — ABNORMAL LOW
O2 Saturation: 100
O2 Saturation: 100
Operator id: 146111
Operator id: 146111
Patient temperature: 36.1
Patient temperature: 36.1
Potassium: 4.1
Potassium: 4.2
Sodium: 138
Sodium: 139
TCO2: 24
TCO2: 27
pCO2 arterial: 39.5
pCO2 arterial: 41.9
pH, Arterial: 7.367
pH, Arterial: 7.4
pO2, Arterial: 165 — ABNORMAL HIGH
pO2, Arterial: 252 — ABNORMAL HIGH

## 2010-12-07 LAB — HEPATIC FUNCTION PANEL
ALT: 15
AST: 24
Albumin: 2.2 — ABNORMAL LOW
Alkaline Phosphatase: 81
Bilirubin, Direct: 0.1
Indirect Bilirubin: 0.5
Total Bilirubin: 0.6
Total Protein: 5.4 — ABNORMAL LOW

## 2010-12-08 LAB — COMPREHENSIVE METABOLIC PANEL
ALT: 20
AST: 25
Albumin: 3.7
Alkaline Phosphatase: 120 — ABNORMAL HIGH
BUN: 13
CO2: 24
Calcium: 9.7
Chloride: 104
Creatinine, Ser: 0.94
GFR calc Af Amer: >60
GFR calc non Af Amer: 59 — ABNORMAL LOW
Glucose, Bld: 135 — ABNORMAL HIGH
Potassium: 3.9
Sodium: 139
Total Bilirubin: 0.5
Total Protein: 6.9

## 2010-12-08 LAB — TYPE AND SCREEN
ABO/RH(D): A NEG
Antibody Screen: NEGATIVE

## 2010-12-08 LAB — DIFFERENTIAL
Basophils Absolute: 0
Basophils Relative: 0
Eosinophils Absolute: 0.2
Eosinophils Relative: 3
Lymphocytes Relative: 25
Lymphs Abs: 1.6
Monocytes Absolute: 0.6
Monocytes Relative: 9
Neutro Abs: 3.9
Neutrophils Relative %: 63

## 2010-12-08 LAB — CBC
HCT: 39.7
Hemoglobin: 13.2
MCHC: 33.3
MCV: 86.1
Platelets: 195
RBC: 4.61
RDW: 16.1 — ABNORMAL HIGH
WBC: 6.3

## 2010-12-08 LAB — URINE MICROSCOPIC-ADD ON

## 2010-12-08 LAB — URINALYSIS, ROUTINE W REFLEX MICROSCOPIC
Bilirubin Urine: NEGATIVE
Glucose, UA: NEGATIVE
Ketones, ur: NEGATIVE
Nitrite: NEGATIVE
Protein, ur: NEGATIVE
Specific Gravity, Urine: 1.012 (ref 1.005–1.035)
Urobilinogen, UA: 0.2
pH: 6

## 2010-12-08 LAB — PROTIME-INR
INR: 1
Prothrombin Time: 13.8

## 2010-12-08 LAB — ABO/RH: ABO/RH(D): A NEG

## 2010-12-08 LAB — APTT: aPTT: 41 — ABNORMAL HIGH

## 2011-09-12 ENCOUNTER — Ambulatory Visit: Payer: Self-pay | Admitting: Ophthalmology

## 2013-07-21 DIAGNOSIS — G8929 Other chronic pain: Secondary | ICD-10-CM | POA: Insufficient documentation

## 2017-08-22 DIAGNOSIS — Z79891 Long term (current) use of opiate analgesic: Secondary | ICD-10-CM | POA: Insufficient documentation

## 2017-08-22 DIAGNOSIS — M4716 Other spondylosis with myelopathy, lumbar region: Secondary | ICD-10-CM | POA: Insufficient documentation

## 2017-08-22 HISTORY — DX: Long term (current) use of opiate analgesic: Z79.891

## 2018-05-29 ENCOUNTER — Other Ambulatory Visit: Payer: Self-pay

## 2018-05-29 ENCOUNTER — Other Ambulatory Visit: Payer: Self-pay | Admitting: Internal Medicine

## 2018-05-29 ENCOUNTER — Ambulatory Visit
Admission: RE | Admit: 2018-05-29 | Discharge: 2018-05-29 | Disposition: A | Discharge status: Home / Self Care | Payer: Medicare Other | Source: Ambulatory Visit | Attending: Internal Medicine | Admitting: Internal Medicine

## 2018-05-29 DIAGNOSIS — K56699 Other intestinal obstruction unspecified as to partial versus complete obstruction: Secondary | ICD-10-CM

## 2018-05-29 HISTORY — DX: Essential (primary) hypertension: I10

## 2018-05-29 MED ORDER — IOHEXOL 300 MG/ML  SOLN
75.0000 mL | Freq: Once | INTRAMUSCULAR | Status: AC | PRN
  Administered 2018-05-29: 75 mL via INTRAVENOUS

## 2018-06-02 ENCOUNTER — Emergency Department: Payer: Medicare Other

## 2018-06-02 ENCOUNTER — Encounter: Payer: Self-pay | Admitting: Emergency Medicine

## 2018-06-02 ENCOUNTER — Inpatient Hospital Stay
Admission: EM | Admit: 2018-06-02 | Discharge: 2018-06-08 | DRG: 381 | Disposition: A | Discharge status: Home Health | Payer: Medicare Other | Attending: Internal Medicine | Admitting: Internal Medicine

## 2018-06-02 ENCOUNTER — Other Ambulatory Visit: Payer: Self-pay

## 2018-06-02 DIAGNOSIS — K567 Ileus, unspecified: Secondary | ICD-10-CM | POA: Diagnosis not present

## 2018-06-02 DIAGNOSIS — N390 Urinary tract infection, site not specified: Secondary | ICD-10-CM | POA: Diagnosis present

## 2018-06-02 DIAGNOSIS — Z89412 Acquired absence of left great toe: Secondary | ICD-10-CM | POA: Diagnosis not present

## 2018-06-02 DIAGNOSIS — I10 Essential (primary) hypertension: Secondary | ICD-10-CM | POA: Diagnosis present

## 2018-06-02 DIAGNOSIS — Z96651 Presence of right artificial knee joint: Secondary | ICD-10-CM | POA: Diagnosis present

## 2018-06-02 DIAGNOSIS — K56699 Other intestinal obstruction unspecified as to partial versus complete obstruction: Secondary | ICD-10-CM | POA: Diagnosis not present

## 2018-06-02 DIAGNOSIS — K311 Adult hypertrophic pyloric stenosis: Secondary | ICD-10-CM | POA: Diagnosis present

## 2018-06-02 DIAGNOSIS — Z79891 Long term (current) use of opiate analgesic: Secondary | ICD-10-CM | POA: Diagnosis not present

## 2018-06-02 DIAGNOSIS — K9419 Other complications of enterostomy: Secondary | ICD-10-CM | POA: Diagnosis present

## 2018-06-02 DIAGNOSIS — Z888 Allergy status to other drugs, medicaments and biological substances status: Secondary | ICD-10-CM | POA: Diagnosis not present

## 2018-06-02 DIAGNOSIS — R112 Nausea with vomiting, unspecified: Secondary | ICD-10-CM

## 2018-06-02 DIAGNOSIS — Z980 Intestinal bypass and anastomosis status: Secondary | ICD-10-CM

## 2018-06-02 DIAGNOSIS — E878 Other disorders of electrolyte and fluid balance, not elsewhere classified: Secondary | ICD-10-CM | POA: Diagnosis present

## 2018-06-02 DIAGNOSIS — R739 Hyperglycemia, unspecified: Secondary | ICD-10-CM | POA: Diagnosis present

## 2018-06-02 DIAGNOSIS — K219 Gastro-esophageal reflux disease without esophagitis: Secondary | ICD-10-CM | POA: Diagnosis present

## 2018-06-02 DIAGNOSIS — E7800 Pure hypercholesterolemia, unspecified: Secondary | ICD-10-CM | POA: Diagnosis present

## 2018-06-02 DIAGNOSIS — Z9889 Other specified postprocedural states: Secondary | ICD-10-CM | POA: Diagnosis not present

## 2018-06-02 DIAGNOSIS — K209 Esophagitis, unspecified: Secondary | ICD-10-CM | POA: Diagnosis present

## 2018-06-02 DIAGNOSIS — K317 Polyp of stomach and duodenum: Secondary | ICD-10-CM | POA: Diagnosis present

## 2018-06-02 DIAGNOSIS — Z66 Do not resuscitate: Secondary | ICD-10-CM | POA: Diagnosis present

## 2018-06-02 DIAGNOSIS — K913 Postprocedural intestinal obstruction, unspecified as to partial versus complete: Secondary | ICD-10-CM | POA: Diagnosis present

## 2018-06-02 DIAGNOSIS — Z79899 Other long term (current) drug therapy: Secondary | ICD-10-CM

## 2018-06-02 DIAGNOSIS — K3189 Other diseases of stomach and duodenum: Secondary | ICD-10-CM

## 2018-06-02 DIAGNOSIS — G959 Disease of spinal cord, unspecified: Secondary | ICD-10-CM | POA: Diagnosis present

## 2018-06-02 DIAGNOSIS — F0390 Unspecified dementia without behavioral disturbance: Secondary | ICD-10-CM | POA: Diagnosis present

## 2018-06-02 DIAGNOSIS — E876 Hypokalemia: Secondary | ICD-10-CM | POA: Diagnosis present

## 2018-06-02 DIAGNOSIS — R933 Abnormal findings on diagnostic imaging of other parts of digestive tract: Secondary | ICD-10-CM | POA: Diagnosis not present

## 2018-06-02 HISTORY — DX: Ileus, unspecified: K56.7

## 2018-06-02 HISTORY — DX: Pure hypercholesterolemia, unspecified: E78.00

## 2018-06-02 HISTORY — DX: Gastro-esophageal reflux disease without esophagitis: K21.9

## 2018-06-02 LAB — LIPASE, BLOOD: Lipase: 25 U/L (ref 11–51)

## 2018-06-02 LAB — URINALYSIS, COMPLETE (UACMP) WITH MICROSCOPIC
Bacteria, UA: NONE SEEN
Bilirubin Urine: NEGATIVE
Glucose, UA: NEGATIVE mg/dL
Ketones, ur: 5 mg/dL — AB
Leukocytes,Ua: NEGATIVE
Nitrite: NEGATIVE
Protein, ur: 30 mg/dL — AB
RBC / HPF: >50 RBC/hpf — ABNORMAL HIGH (ref 0–5)
Specific Gravity, Urine: 1.042 — ABNORMAL HIGH (ref 1.005–1.030)
pH: 7 (ref 5.0–8.0)

## 2018-06-02 LAB — COMPREHENSIVE METABOLIC PANEL
ALT: 18 U/L (ref 0–44)
AST: 20 U/L (ref 15–41)
Albumin: 3.9 g/dL (ref 3.5–5.0)
Alkaline Phosphatase: 65 U/L (ref 38–126)
Anion gap: 11 (ref 5–15)
BUN: 11 mg/dL (ref 8–23)
CO2: 36 mmol/L — ABNORMAL HIGH (ref 22–32)
Calcium: 9.6 mg/dL (ref 8.9–10.3)
Chloride: 89 mmol/L — ABNORMAL LOW (ref 98–111)
Creatinine, Ser: 0.75 mg/dL (ref 0.44–1.00)
GFR calc Af Amer: >60 mL/min (ref >60)
GFR calc non Af Amer: >60 mL/min (ref >60)
Glucose, Bld: 219 mg/dL — ABNORMAL HIGH (ref 70–99)
Potassium: 3 mmol/L — ABNORMAL LOW (ref 3.5–5.1)
Sodium: 136 mmol/L (ref 135–145)
Total Bilirubin: 0.9 mg/dL (ref 0.3–1.2)
Total Protein: 7.1 g/dL (ref 6.5–8.1)

## 2018-06-02 LAB — CBC
HCT: 48.2 % — ABNORMAL HIGH (ref 36.0–46.0)
Hemoglobin: 16.2 g/dL — ABNORMAL HIGH (ref 12.0–15.0)
MCH: 29.9 pg (ref 26.0–34.0)
MCHC: 33.6 g/dL (ref 30.0–36.0)
MCV: 89.1 fL (ref 80.0–100.0)
Platelets: 176 10*3/uL (ref 150–400)
RBC: 5.41 MIL/uL — ABNORMAL HIGH (ref 3.87–5.11)
RDW: 12.9 % (ref 11.5–15.5)
WBC: 16.2 10*3/uL — ABNORMAL HIGH (ref 4.0–10.5)
nRBC: 0 % (ref 0.0–0.2)

## 2018-06-02 MED ORDER — IOHEXOL 300 MG/ML  SOLN
60.0000 mL | Freq: Once | INTRAMUSCULAR | Status: AC | PRN
  Administered 2018-06-02: 23:00:00 60 mL via INTRAVENOUS

## 2018-06-02 MED ORDER — SODIUM CHLORIDE 0.9% FLUSH: 3.0000 mL | Freq: Once | INTRAVENOUS | Status: DC

## 2018-06-02 MED ORDER — SODIUM CHLORIDE 0.9 % IV BOLUS
250.0000 mL | Freq: Once | INTRAVENOUS | Status: AC
  Administered 2018-06-02: 250 mL via INTRAVENOUS

## 2018-06-02 MED ORDER — ONDANSETRON HCL 4 MG/2ML IJ SOLN
4.0000 mg | Freq: Once | INTRAMUSCULAR | Status: AC | PRN
  Administered 2018-06-02: 4 mg via INTRAVENOUS
  Filled 2018-06-02: qty 2

## 2018-06-02 NOTE — ED Triage Notes (Addendum)
Pt here for c/o generalized aching abd pain x 2 weeks; pt has seen Dr Judithann Sheen 3 times since this started; pt currently taking Cipro for UTI; pt on Fentanyl patch and oxycodone for back pain related to surgery years ago; pt says she had a good bowel movement this evening but does have intermittent constipation; denies urinary s/s; sister reports pt did vomit a lot of liquid brown on the way to the ED; sister says pt has been weak and that she is probably dehydrated; decreased oral intake;

## 2018-06-02 NOTE — ED Notes (Signed)
Patient was found sitting on end of the stretcher. Patient states she thought no one was coming back. Patient was redirected to place after being repositioned on stretcher. Dr. Roxan Hockey aware.

## 2018-06-02 NOTE — ED Provider Notes (Signed)
[facility] Emergency Department Provider Note    First MD Initiated Contact with Patient 06/02/18 2134     (approximate)  I have reviewed the triage vital signs and the nursing notes.   HISTORY  Chief Complaint Abdominal Pain    HPI [name] is a 85 y.o. female presents the ER for evaluation of 2 weeks of persistent nausea vomiting and inability to keep anything down.  Patient has been evaluated by PCP with reassuring work-up did have antibiotic prescribed for possible UTI and subsequently had CT abdomen showing evidence of probable ileus.  Over the past 24 hours patient states that she is been unable to keep anything down.  Having worsening epigastric discomfort.  Did have a normal bowel movement.    Past Medical History:  Diagnosis Date  . GERD (gastroesophageal reflux disease)   . High cholesterol   . Hypertension    History reviewed. No pertinent family history. Past Surgical History:  Procedure Laterality Date  . ABDOMINAL SURGERY    . BACK SURGERY    . CHOLECYSTECTOMY    . REPLACEMENT TOTAL KNEE Right   . TOE AMPUTATION Left    great toe  . TONSILLECTOMY     There are no active problems to display for this patient.     Prior to Admission medications   Not on File    Allergies Colesevelam; Lovastatin; and Tetracyclines & related    Social History Social History   Tobacco Use  . Smoking status: Never Smoker  . Smokeless tobacco: Never Used  Substance Use Topics  . Alcohol use: Never    Frequency: Never  . Drug use: Never    Review of Systems Patient denies headaches, rhinorrhea, blurry vision, numbness, shortness of breath, chest pain, edema, cough, abdominal pain, nausea, vomiting, diarrhea, dysuria, fevers, rashes or hallucinations unless otherwise stated above in HPI. ____________________________________________   PHYSICAL EXAM:  VITAL SIGNS: Vitals:   06/02/18 2125  BP: (!) 134/55  Pulse: 92  Resp:  16  Temp: 98.6 F (37 C)  SpO2: 95%    Constitutional: Alert and oriented. Frail appearing Eyes: Conjunctivae are normal.  Head: Atraumatic. Nose: No congestion/rhinnorhea. Mouth/Throat: Mucous membranes are dry  Neck: No stridor. Painless ROM.  Cardiovascular: Normal rate, regular rhythm. Grossly normal heart sounds.  Good peripheral circulation. Respiratory: Normal respiratory effort.  No retractions. Lungs CTAB. Gastrointestinal: Soft and nontender. No distention. No abdominal bruits. No CVA tenderness. Genitourinary: deferred Musculoskeletal: No lower extremity tenderness nor edema.  No joint effusions. Neurologic:  Normal speech and language. No gross focal neurologic deficits are appreciated. No facial droop Skin:  Skin is warm, dry and intact. No rash noted. Psychiatric: Mood and affect are normal. Speech and behavior are normal.  ____________________________________________   LABS (all labs ordered are listed, but only abnormal results are displayed)  Results for orders placed or performed during the hospital encounter of 06/02/18 (from the past 24 hour(s))  Lipase, blood     Status: None   Collection Time: 06/02/18  9:46 PM  Result Value Ref Range   Lipase 25 11 - 51 U/L  Comprehensive metabolic panel     Status: Abnormal   Collection Time: 06/02/18  9:46 PM  Result Value Ref Range   Sodium 136 135 - 145 mmol/L   Potassium 3.0 (L) 3.5 - 5.1 mmol/L   Chloride 89 (L) 98 - 111 mmol/L   CO2 36 (H) 22 - 32 mmol/L   Glucose, Bld 219 (H)  70 - 99 mg/dL   BUN 11 8 - 23 mg/dL   Creatinine, Ser 1.610.75 0.44 - 1.00 mg/dL   Calcium 9.6 8.9 - 09.610.3 mg/dL   Total Protein 7.1 6.5 - 8.1 g/dL   Albumin 3.9 3.5 - 5.0 g/dL   AST 20 15 - 41 U/L   ALT 18 0 - 44 U/L   Alkaline Phosphatase 65 38 - 126 U/L   Total Bilirubin 0.9 0.3 - 1.2 mg/dL   GFR calc non Af Amer >60 >60 mL/min   GFR calc Af Amer >60 >60 mL/min   Anion gap 11 5 - 15  CBC     Status: Abnormal   Collection Time:  06/02/18  9:46 PM  Result Value Ref Range   WBC 16.2 (H) 4.0 - 10.5 K/uL   RBC 5.41 (H) 3.87 - 5.11 MIL/uL   Hemoglobin 16.2 (H) 12.0 - 15.0 g/dL   HCT 04.548.2 (H) 40.936.0 - 81.146.0 %   MCV 89.1 80.0 - 100.0 fL   MCH 29.9 26.0 - 34.0 pg   MCHC 33.6 30.0 - 36.0 g/dL   RDW 91.412.9 78.211.5 - 95.615.5 %   Platelets 176 150 - 400 K/uL   nRBC 0.0 0.0 - 0.2 %   ____________________________________________  EKG My review and personal interpretation at Time: 22:17   Indication: N/V  Rate: 70  Rhythm: sinus Axis: normal Other: normal intervals, no stemi ____________________________________________  RADIOLOGY  I personally reviewed all radiographic images ordered to evaluate for the above acute complaints and reviewed radiology reports and findings.  These findings were personally discussed with the patient.  Please see medical record for radiology report.  ____________________________________________   PROCEDURES  Procedure(s) performed:  Procedures    Critical Care performed: no ____________________________________________   INITIAL IMPRESSION / ASSESSMENT AND PLAN / ED COURSE  Pertinent labs & imaging results that were available during my care of the patient were reviewed by me and considered in my medical decision making (see chart for details).   DDX: SBO, ileus, colitis, pancreatitis, enteritis, UTI, dehydration, electrolyte abnormality  Brittany Rexene EdisonH Sharol HarnessSimmons is a 85 y.o. who presents to the ED with several days of progressively worsening nausea and vomiting.  Did have recent CT imaging which showed ileus the patient has progressively worsened.  Appears dehydrated.  Will give IV fluids as well as IV antibiotics.  Blood work checked which shows hemoconcentration with worsening leukocytosis as well as hypokalemia.  Will order CT imaging to reevaluate given concern for the by differential.  Clinical Course as of Jun 02 2330  Sun Jun 02, 2018  2332 CT imaging results discussed with family.  Will  place NG tube due to gastric distention nausea and vomiting.  Discussed case with hospitalist for further medical management.   [PR]    Clinical Course User Index [PR] [name], Brittany Moncus, MD    The patient was evaluated in Emergency Department today for the symptoms described in the history of present illness. He/she was evaluated in the context of the global COVID-19 pandemic, which necessitated consideration that the patient might be at risk for infection with the SARS-CoV-2 virus that causes COVID-19. Institutional protocols and algorithms that pertain to the evaluation of patients at risk for COVID-19 are in a state of rapid change based on information released by regulatory bodies including the CDC and federal and state organizations. These policies and algorithms were followed during the patient's care in the ED.  As part of my medical decision making, I reviewed the  following data within the electronic MEDICAL RECORD NUMBER Nursing notes reviewed and incorporated, Labs reviewed, notes from prior ED visits.   ____________________________________________   FINAL CLINICAL IMPRESSION(S) / ED DIAGNOSES  Final diagnoses:  Intractable vomiting with nausea, unspecified vomiting type  Gastric distention      NEW MEDICATIONS STARTED DURING THIS VISIT:  New Prescriptions   No medications on file     Note:  This document was prepared using Dragon voice recognition software and may include unintentional dictation errors.    Brittany Eddy, MD 06/02/18 [phone]

## 2018-06-02 NOTE — ED Notes (Signed)
Report given to [facility]. Patient's family member is coming to sit with the patient to redirect her stay on the stretcher.

## 2018-06-02 NOTE — ED Notes (Signed)
Patient taken to CT scan. Attempted to contact patient's daughter, but was unable to get through.

## 2018-06-02 NOTE — ED Notes (Signed)
Took pt to room 24 and hooked pt up to monitor.

## 2018-06-03 ENCOUNTER — Encounter: Payer: Self-pay | Admitting: *Deleted

## 2018-06-03 ENCOUNTER — Inpatient Hospital Stay: Payer: Medicare Other

## 2018-06-03 DIAGNOSIS — K567 Ileus, unspecified: Secondary | ICD-10-CM

## 2018-06-03 LAB — CBC
HCT: 42.6 % (ref 36.0–46.0)
Hemoglobin: 14.4 g/dL (ref 12.0–15.0)
MCH: 30.6 pg (ref 26.0–34.0)
MCHC: 33.8 g/dL (ref 30.0–36.0)
MCV: 90.4 fL (ref 80.0–100.0)
Platelets: 145 10*3/uL — ABNORMAL LOW (ref 150–400)
RBC: 4.71 MIL/uL (ref 3.87–5.11)
RDW: 13.1 % (ref 11.5–15.5)
WBC: 9.1 10*3/uL (ref 4.0–10.5)
nRBC: 0 % (ref 0.0–0.2)

## 2018-06-03 LAB — BASIC METABOLIC PANEL
Anion gap: 8 (ref 5–15)
BUN: 10 mg/dL (ref 8–23)
CO2: 35 mmol/L — ABNORMAL HIGH (ref 22–32)
Calcium: 8.8 mg/dL — ABNORMAL LOW (ref 8.9–10.3)
Chloride: 95 mmol/L — ABNORMAL LOW (ref 98–111)
Creatinine, Ser: 0.7 mg/dL (ref 0.44–1.00)
GFR calc Af Amer: >60 mL/min (ref >60)
GFR calc non Af Amer: >60 mL/min (ref >60)
Glucose, Bld: 128 mg/dL — ABNORMAL HIGH (ref 70–99)
Potassium: 3.1 mmol/L — ABNORMAL LOW (ref 3.5–5.1)
Sodium: 138 mmol/L (ref 135–145)

## 2018-06-03 LAB — MAGNESIUM: Magnesium: 1.8 mg/dL (ref 1.7–2.4)

## 2018-06-03 LAB — PHOSPHORUS: Phosphorus: 3.4 mg/dL (ref 2.5–4.6)

## 2018-06-03 MED ORDER — POTASSIUM CHLORIDE IN NACL 40-0.9 MEQ/L-% IV SOLN
INTRAVENOUS | Status: DC
  Administered 2018-06-03 (×3): 100 mL/h via INTRAVENOUS
  Filled 2018-06-03 (×5): qty 1000

## 2018-06-03 MED ORDER — ACETAMINOPHEN 650 MG RE SUPP: 650.0000 mg | Freq: Four times a day (QID) | RECTAL | Status: DC | PRN

## 2018-06-03 MED ORDER — POTASSIUM CHLORIDE 10 MEQ/100ML IV SOLN
10.0000 meq | INTRAVENOUS | Status: AC
  Administered 2018-06-03 (×4): 10 meq via INTRAVENOUS
  Filled 2018-06-03 (×3): qty 100

## 2018-06-03 MED ORDER — ACETAMINOPHEN 325 MG PO TABS
650.0000 mg | ORAL_TABLET | Freq: Four times a day (QID) | ORAL | Status: DC | PRN
  Administered 2018-06-06 – 2018-06-08 (×5): 650 mg via ORAL
  Filled 2018-06-03 (×5): qty 2

## 2018-06-03 MED ORDER — PANTOPRAZOLE SODIUM 40 MG IV SOLR
40.0000 mg | INTRAVENOUS | Status: DC
  Administered 2018-06-03: 40 mg via INTRAVENOUS
  Filled 2018-06-03: qty 40

## 2018-06-03 MED ORDER — MAGNESIUM SULFATE 2 GM/50ML IV SOLN
2.0000 g | Freq: Once | INTRAVENOUS | Status: AC
  Administered 2018-06-03: 11:00:00 2 g via INTRAVENOUS
  Filled 2018-06-03: qty 50

## 2018-06-03 MED ORDER — FENTANYL 100 MCG/HR TD PT72
1.0000 | MEDICATED_PATCH | TRANSDERMAL | Status: DC
  Administered 2018-06-03: 1 via TRANSDERMAL
  Filled 2018-06-03: qty 1

## 2018-06-03 MED ORDER — ONDANSETRON HCL 4 MG/2ML IJ SOLN: 4.0000 mg | Freq: Four times a day (QID) | INTRAMUSCULAR | Status: DC | PRN

## 2018-06-03 MED ORDER — ONDANSETRON HCL 4 MG PO TABS: 4.0000 mg | ORAL_TABLET | Freq: Four times a day (QID) | ORAL | Status: DC | PRN

## 2018-06-03 MED ORDER — ENOXAPARIN SODIUM 40 MG/0.4ML ~~LOC~~ SOLN
40.0000 mg | SUBCUTANEOUS | Status: DC
  Administered 2018-06-03 – 2018-06-08 (×6): 40 mg via SUBCUTANEOUS
  Filled 2018-06-03 (×6): qty 0.4

## 2018-06-03 NOTE — Progress Notes (Signed)
Family Meeting Note  Advance Directive:pt not sure Today a meeting took place with the pt and her Sister was present in ER patient is being admitted with abdominal distention nausea and intractable vomiting. She likely has gastric outlet obstruction/ileus. She is clinically dehydrated with electrolyte abnormality. Discuss code status with the patient and she said she does not want to be resuscitated. She is a DNR. Sister was present in the emergency room. Time spent during discussion:16 mins Enedina Finner, MD

## 2018-06-03 NOTE — TOC Initial Note (Signed)
Transition of Care [facility]) - Initial/Assessment Note    Patient Details  Name: [name] MRN: [id number] Date of Birth: 18-Oct-1932  Transition of Care [facility]) CM/SW Contact:    Chapman Fitch, RN Phone Number: 06/03/2018, 2:40 PM  Clinical Narrative:                 Patient admitted for nausea and vomiting. Patient states that she lives at home with her husband.  Her husband has mestatic cancer.  Their daughter is currently staying to take care of her husband.  Daughter lives locally for support.   PCP Dr Judithann Sheen.  Pharmacy [facility].  Patient denies issues with obtaining medications.  Patient states that her daughter and sister provide transportation when needed.  Patient states that she has a WC, rollator, and 4 prong cane in the home.  Patient states that she uses the rollator in the home and cane when she goes out to placed like church.   No current home health services.  States that she has had [facility] in the past.   RNCM following for discharge planning.   PT eval pending.   Expected Discharge Plan: Home w Home Health Services Barriers to Discharge: Continued Medical Work up   Patient Goals and CMS Choice Patient states their goals for this hospitalization and ongoing recovery are:: "im ready to get this tube out"      Expected Discharge Plan and Services Expected Discharge Plan: Home w Home Health Services       Living arrangements for the past 2 months: Single Family Home                          Prior Living Arrangements/Services Living arrangements for the past 2 months: Single Family Home Lives with:: Spouse   Do you feel safe going back to the place where you live?: Yes      Need for Family Participation in Patient Care: Yes (Comment) Care giver support system in place?: Yes (comment)   Criminal Activity/Legal Involvement Pertinent to Current Situation/Hospitalization: No - Comment as needed  Activities of Daily Living Home  Assistive Devices/Equipment: Dan Humphreys (specify type) ADL Screening (condition at time of admission) Patient's cognitive ability adequate to safely complete daily activities?: Yes Is the patient deaf or have difficulty hearing?: Yes Does the patient have difficulty seeing, even when wearing glasses/contacts?: No Does the patient have difficulty concentrating, remembering, or making decisions?: No Patient able to express need for assistance with ADLs?: Yes Does the patient have difficulty dressing or bathing?: No Independently performs ADLs?: Yes (appropriate for developmental age) Does the patient have difficulty walking or climbing stairs?: Yes Weakness of Legs: Both Weakness of Arms/Hands: None  Permission Sought/Granted                  Emotional Assessment Appearance:: Appears stated age Attitude/Demeanor/Rapport: Gracious Affect (typically observed): Accepting Orientation: : Oriented to Self, Oriented to Situation   Psych Involvement: No (comment)  Admission diagnosis:  Gastric distention [K31.89] Intractable vomiting with nausea, unspecified vomiting type [R11.2] Patient Active Problem List   Diagnosis Date Noted  . Ileus (HCC) 06/02/2018   PCP:  Default, Provider, MD Pharmacy:   [facility] [place] - [place], [place] - [address] [place] [address] Phone: [phone] Fax: [phone]     Social Determinants of Health (SDOH) Interventions    Readmission Risk Interventions No flowsheet data found.

## 2018-06-03 NOTE — Progress Notes (Addendum)
Initial Nutrition Assessment  DOCUMENTATION CODES:   Not applicable  INTERVENTION:   RD will monitor for diet advancement vs the need for nutrition support  Pt at high refeeding risk  NUTRITION DIAGNOSIS:   Inadequate oral intake related to acute illness as evidenced by NPO status  GOAL:   Patient will meet greater than or equal to 90% of their needs  MONITOR:   Diet advancement, Labs, Weight trends, I & O's, Skin  REASON FOR ASSESSMENT:   Malnutrition Screening Tool    ASSESSMENT:   85 y.o. female with a known history of mild dementia, Gerd, hypertension, high cholesterol, GOO s/p gastrojejunostomy in 2011 admitted with 2 week h/o nausea and vomiting. Pt noted to have adynamic ileus   RD working remotely.  Per chart review, pt with poor appetite and oral intake for two weeks pta r/t nausea and vomiting. Pt remains NPO today with NGT in place and output. Per chart, pt with 15lb(12%) weight loss in <2 months; RD unsure how much of this weight loss is attributable to dehydration but likely it is still significant. RD will monitor for the need for nutrition support. Pt likely at high refeed risk.    Pt likely at high risk for malnutrition but unable to diagnose at this time  Medications reviewed and include: lovenox, fentanyl, protonix, NaCl @100ml /hr, Mg sulfate, KCl  Labs reviewed: K 3.1(L), Cl 95(L), Mg 1.8 wnl  Unable to complete Nutrition-Focused physical exam at this time.   Diet Order:   Diet Order            Diet NPO time specified  Diet effective now             EDUCATION NEEDS:   Not appropriate for education at this time  Skin:  Skin Assessment: Reviewed RN Assessment  Last BM:  4/11  Height:   Ht Readings from Last 1 Encounters:  06/03/18 5\' 2"  (1.575 m)    Weight:   Wt Readings from Last 1 Encounters:  06/03/18 48.4 kg    Ideal Body Weight:  50 kg  BMI:  Body mass index is 19.52 kg/m.  Estimated Nutritional Needs:    Kcal:  1200-1400kcal/day   Protein:  63-72g/day   Fluid:  >1.2L/day   Betsey Holiday MS, RD, LDN Pager #- [phone] Office#- [phone] After Hours Pager: [phone]

## 2018-06-03 NOTE — Progress Notes (Signed)
[facility] [place] [facility]   PATIENT NAME: [name]    MR#:  [id number]  DATE OF BIRTH:  1932-05-30  SUBJECTIVE:  CHIEF COMPLAINT: Patient is feeling better with NG tube upper GI series done  REVIEW OF SYSTEMS:  CONSTITUTIONAL: No fever, fatigue or weakness.  EYES: No blurred or double vision.  EARS, NOSE, AND THROAT: No tinnitus or ear pain.  RESPIRATORY: No cough, shortness of breath, wheezing or hemoptysis.  CARDIOVASCULAR: No chest pain, orthopnea, edema.  GASTROINTESTINAL: No nausea, vomiting, diarrhea or abdominal pain.  NG tube intact GENITOURINARY: No dysuria, hematuria.  ENDOCRINE: No polyuria, nocturia,  HEMATOLOGY: No anemia, easy bruising or bleeding SKIN: No rash or lesion. MUSCULOSKELETAL: No joint pain or arthritis.   NEUROLOGIC: No tingling, numbness, weakness.  PSYCHIATRY: No anxiety or depression.   DRUG ALLERGIES:   Allergies  Allergen Reactions  . Colesevelam Other (See Comments)  . Lovastatin Other (See Comments)  . Tetracyclines & Related Rash    VITALS:  Blood pressure (!) 135/57, pulse 74, temperature 98.1 F (36.7 C), temperature source Oral, resp. rate 17, height 5\' 2"  (1.575 m), weight 48.4 kg, SpO2 98 %.  PHYSICAL EXAMINATION:  GENERAL:  85 y.o.-year-old patient lying in the bed with no acute distress.  EYES: Pupils equal, round, reactive to light and accommodation. No scleral icterus. Extraocular muscles intact.  HEENT: Head atraumatic, normocephalic. Oropharynx and nasopharynx clear.  NECK:  Supple, no jugular venous distention. No thyroid enlargement, no tenderness.  LUNGS: Normal breath sounds bilaterally, no wheezing, rales,rhonchi or crepitation. No use of accessory muscles of respiration.  CARDIOVASCULAR: S1, S2 normal. No murmurs, rubs, or gallops.  ABDOMEN: Soft, nontender, nondistended.  No bowel sounds positive NG tube eXTREMITIES: No pedal edema, cyanosis, or clubbing.  NEUROLOGIC: Awake,  alert and oriented x3 sensation intact. Gait not checked.  PSYCHIATRIC: The patient is alert and oriented x 3.  SKIN: No obvious rash, lesion, or ulcer.    LABORATORY PANEL:   CBC Recent Labs  Lab 06/03/18 0841  WBC 9.1  HGB 14.4  HCT 42.6  PLT 145*   ------------------------------------------------------------------------------------------------------------------  Chemistries  Recent Labs  Lab 06/02/18 2146 06/03/18 0841  NA 136 138  K 3.0* 3.1*  CL 89* 95*  CO2 36* 35*  GLUCOSE 219* 128*  BUN 11 10  CREATININE 0.75 0.70  CALCIUM 9.6 8.8*  MG  --  1.8  AST 20  --   ALT 18  --   ALKPHOS 65  --   BILITOT 0.9  --    ------------------------------------------------------------------------------------------------------------------  Cardiac Enzymes No results for input(s): TROPONINI in the last 168 hours. ------------------------------------------------------------------------------------------------------------------  RADIOLOGY:  Dg Abdomen 1 View  Result Date: 06/03/2018 CLINICAL DATA:  85 y/o  F; NG tube placement. EXAM: ABDOMEN - 1 VIEW COMPARISON:  06/02/2018 CT abdomen and pelvis. FINDINGS: Enteric tube tip projects over the proximal stomach. Right upper quadrant cholecystectomy clips. Lumbar fusion hardware noted. Bones are demineralized. There is contrast accumulation within the bladder. Nonobstructive bowel gas pattern. IMPRESSION: Enteric tube tip projects over proximal stomach. Electronically Signed   By: Mitzi HansenLance  Furusawa-Stratton M.D.   On: 06/03/2018 00:33   Ct Abdomen Pelvis W Contrast  Result Date: 06/02/2018 CLINICAL DATA:  Acute generalized abdominal pain. EXAM: CT ABDOMEN AND PELVIS WITH CONTRAST TECHNIQUE: Multidetector CT imaging of the abdomen and pelvis was performed using the standard protocol following bolus administration of intravenous contrast. CONTRAST:  60mL OMNIPAQUE IOHEXOL 300 MG/ML  SOLN COMPARISON:  CT  scan of May 29, 2018. FINDINGS:  Lower chest: No acute abnormality. Hepatobiliary: No focal liver abnormality is seen. Status post cholecystectomy. No biliary dilatation. Pancreas: Unremarkable. No pancreatic ductal dilatation or surrounding inflammatory changes. Spleen: Normal in size without focal abnormality. Adrenals/Urinary Tract: Adrenal glands appear normal. Left renal cyst is noted. No hydronephrosis or renal obstruction is noted. No renal or ureteral calculi are noted. Urinary bladder is unremarkable. Stomach/Bowel: Mild gastric distention is noted. No definite evidence of large or small bowel obstruction or inflammation is noted. The appendix is not visualized. Vascular/Lymphatic: Aortic atherosclerosis. No enlarged abdominal or pelvic lymph nodes. Reproductive: Uterus and bilateral adnexa are unremarkable. Other: No abdominal wall hernia or abnormality. No abdominopelvic ascites. Musculoskeletal: No acute or significant osseous findings. IMPRESSION: Mild gastric distention is noted. No definite evidence of large or small bowel obstruction or inflammation. Aortic Atherosclerosis (ICD10-I70.0). Electronically Signed   By: Lupita Raider, M.D.   On: 06/02/2018 23:23   Dg Kayleen Memos W Single Cm (sol Or Thin Ba)  Result Date: 06/03/2018 CLINICAL DATA:  History of gastric outlet obstruction with prior gastrojejunostomy in 2011. EXAM: UPPER GI SERIES WITHOUT KUB TECHNIQUE: Routine upper GI series was performed with thin barium. FLUOROSCOPY TIME:  Fluoroscopy Time:  2 minutes and 12 seconds. COMPARISON:  Abdominal film on 06/03/2018, CT of the abdomen on 06/02/2018 and prior upper GI on 02/09/2010 FINDINGS: Thin barium was administered through a pre-existing nasogastric tube which was positioned in the proximal stomach. Multiple fluoroscopic spot images were obtained in different positions. The stomach was initially decompressed by the pre-existing nasogastric tube. After initial partial distention of the stomach with barium, there was no  visualized outflow into the jejunum. After the patient was rolled on her side, the jejunum was visualized. Different projections do not show obvious anastomotic ulceration, mass or leak. Anastomotic stricture may be present and is not able to be fully evaluated on the current study. However, the presence of stricture may be implicated by the relatively slow visualization of outflow into the jejunum. IMPRESSION: Initial lack of visualization of outflow of barium from the stomach into the jejunum. Eventually, the jejunum was visualized with slow flow of barium into the jejunum. No obvious anastomotic ulcer or mass. No evidence of leak. Although anastomotic stricture is not able to be fully visualized, the presence of stricture may be implicated by the relatively slow visualization of outflow into the jejunum. Electronically Signed   By: Irish Lack M.D.   On: 06/03/2018 12:45    EKG:   Orders placed or performed during the hospital encounter of 06/02/18  . ED EKG  . ED EKG  . EKG 12-Lead  . EKG 12-Lead    ASSESSMENT AND PLAN:   [name]  is a 85 y.o. female with a known history of mild dementia, Gerd, hypertension, high cholesterol comes to the emergency room accompanied by patient's sister with above chief complaint. Patient has been managed as outpatient since April 1 with nausea vomiting and dry heaving by Dr. Judithann Sheen.   1.   Ileus  rule out small bowel obstruction -NG tube placed -to suction once x-ray confirmed -surgical consultation with Dr. Assunta Gambles tube placed for decompression -NPO -IV fluids -according to the sister patient had it seems like gastric outlet obstruction surgery in 2012 by Dr. Lemar Livings. Details not available. -Upper GI series with no ulcer but could not rule out GJ anastomosis stenosis -GI consulted Dr. Wyn Quaker planning to do EGD in a.m.  2.   Leukocytosis-probably  reactive  3. Hypokalemia/hypochloremia poor PO intake -iV fluids to replete  potassium  4. Hyperglycemia without diagnosis of diabetes -since hemoglobin A1c is 6.2 in February 2020 -I will continue to monitor. Since patient is not taking anything orally will avoid sliding scale insulin  5. DVT prophylaxis subcu Lovenox     All the records are reviewed and case discussed with Care Management/Social Workerr. Management plans discussed with the patient, tried to call patient's daughter at [phone] unable to get connected   CODE STATUS:   TOTAL TIME TAKING CARE OF THIS PATIENT: 36 minutes.   POSSIBLE D/C IN 2-3 DAYS, DEPENDING ON CLINICAL CONDITION.  Note: This dictation was prepared with Dragon dictation along with smaller phrase technology. Any transcriptional errors that result from this process are unintentional.   Ramonita Lab M.D on 06/03/2018 at 4:38 PM  Between 7am to 6pm - Pager - [phone] After 6pm go to [email or web address] - password [email or web address] [facility]  [facility]  Office  [phone]  CC: Primary care physician; Default, Provider, MD

## 2018-06-03 NOTE — Consult Note (Signed)
PHARMACY CONSULT NOTE - FOLLOW UP  Pharmacy Consult for Electrolyte Monitoring and Replacement   Recent Labs: Potassium (mmol/L)  Date Value  06/03/2018 3.1 (L)   Magnesium (mg/dL)  Date Value  34/74/2595 1.8   Calcium (mg/dL)  Date Value  63/87/5643 8.8 (L)   Albumin (g/dL)  Date Value  32/95/1884 3.9   Phosphorus (mg/dL)  Date Value  16/60/6301 3.4   Sodium (mmol/L)  Date Value  06/03/2018 138     Assessment: Pharmacy has been consulted to monitor electrolytes for this 85yo female with possible ileus and a 10-12 day hx of n/v  Goal of Therapy:  Electrolytes wnl's  Plan:  K =  3.1, Mg = 1.8  Patient is on continuous NS with KCl @ 159ml/hr.  This will provide 96 meq KCl in 24 hours - patient is also receiving IV KCl x 4 runs.  No further replenishment warranted at this time.  Will recheck potassium and magnesium with am labs  Albina Billet, PharmD, BCPS Clinical Pharmacist 06/03/2018 2:54 PM

## 2018-06-03 NOTE — Procedures (Addendum)
Radiology Procedure Note  Procedure: UGI via NG tube  Complications: None  Findings: Initial lack of visualization of outflow of barium from the stomach into the jejunum. Eventually, the jejunum was visualized with slow flow of barium into the jejunum. No obvious anastomotic ulcer or mass. No evidence of leak. Although anastomotic stricture is not able to be fully visualized, the presence of stricture may be implicated by the relatively slow visualization of outflow into the jejunum.   Jodi Marble. Fredia Sorrow, M.D Pager:  [phone]

## 2018-06-03 NOTE — Progress Notes (Signed)
   06/03/18 1300  Clinical Encounter Type  Visited With Family;Other (Comment)  Visit Type Follow-up  Referral From Nurse  Consult/Referral To Chaplain  Spiritual Encounters  Spiritual Needs Prayer;Emotional  Chaplain visit patient and she was lying in bed, nurse tec was near bedside window. Chaplain introduce herself to patient and she ask patient how she was feeling and would see like prayer. Patient said "Yes sure". Chaplain prayed and gave blessing to patient and nurse tec. Nurse tec gave Chaplain blessings as well.

## 2018-06-03 NOTE — Progress Notes (Signed)
Pt takes fentanyl patch 100 mcg/hr and this has not been ordered and is due to be changed today 06-03-18. thanks

## 2018-06-03 NOTE — Progress Notes (Signed)
   06/03/18 0900  Clinical Encounter Type  Visited With Patient not available;Other (Comment)  Visit Type Initial  Referral From Nurse  Consult/Referral To Chaplain  Chaplain received an OR for prayer for patient. Chaplain visit patient and she was asleep. Chaplain told nurse tec that she or another Chaplain will return later.

## 2018-06-03 NOTE — Consult Note (Addendum)
[name] SURGICAL CONSULTATION NOTE (initial) - cpt: 36144   HISTORY OF PRESENT ILLNESS (HPI):  85 y.o. female presented to [facility] ED on 04/12 for evaluation of nausea/vomiting. Patient reports about a 10 day history of intermittent nausea and emesis for which she was seeing her PCP. She did have a CT on 4/08 which showed moderate distension of the stomach, normal caliber small bowel, and distension of the right/transverse colon with air fluid levels. She denied any fever, chills, cough, CP, SOB, abdominal pain, or bladder changes. She was passing flatus yesterday before coming to the ED but that has ceased. She tried to manage these symptoms at home with antiemetics but was unsuccessful. No history of similar. Abdominal surgeries history is positive for cholecystectomy and gastrojejunostomy with Dr Lemar Livings in 2011 for gastric outlet obstruction. Work up in the ED was concerning for intractable nausea and emesis likely secondary to ileus.     This morning, she is feeling a little better since NGT placement. NGT with 950 ccs out since placement.   Surgery is consulted by hospitalist physician Dr. Enedina Finner, MD in this context for evaluation and management of nausea/emesis in the setting of possible ileus.   PAST MEDICAL HISTORY (PMH):  Past Medical History:  Diagnosis Date  . GERD (gastroesophageal reflux disease)   . High cholesterol   . Hypertension      PAST SURGICAL HISTORY (PSH):  Past Surgical History:  Procedure Laterality Date  . ABDOMINAL SURGERY    . BACK SURGERY    . CHOLECYSTECTOMY    . REPLACEMENT TOTAL KNEE Right   . TOE AMPUTATION Left    great toe  . TONSILLECTOMY       MEDICATIONS:  Prior to Admission medications   Medication Sig Start Date End Date Taking? Authorizing Provider  amLODipine (NORVASC) 5 MG tablet Take 1 tablet by mouth daily. 01/05/16  Yes [provider]  atorvastatin (LIPITOR) 20 MG tablet Take 1 tablet by mouth at  bedtime. 09/12/16  Yes [provider]  ciprofloxacin (CIPRO) 500 MG tablet Take 1 tablet by mouth 2 (two) times daily. 05/27/18 06/03/18 Yes [provider]  fentaNYL (DURAGESIC) 100 MCG/HR Place 1 patch onto the skin every 3 (three) days. 03/12/18  Yes [provider]  folic acid (FOLVITE) 1 MG tablet Take 1 tablet by mouth daily. 04/10/18  Yes [provider]  omeprazole (PRILOSEC) 20 MG capsule Take by mouth. 07/05/15  Yes [provider]  oxyCODONE-acetaminophen (PERCOCET) 10-325 MG tablet Take 1 tablet by mouth every 8 (eight) hours as needed for pain.  03/12/18  Yes [provider]  vitamin B-12 (CYANOCOBALAMIN) 1000 MCG tablet Take 1 tablet by mouth daily.   Yes [provider]  dicyclomine (BENTYL) 10 MG capsule Take 10 mg by mouth 3 (three) times daily before meals.  05/22/18 06/01/18  [provider]     ALLERGIES:  Allergies  Allergen Reactions  . Colesevelam Other (See Comments)  . Lovastatin Other (See Comments)  . Tetracyclines & Related Rash     SOCIAL HISTORY:  Social History   Socioeconomic History  . Marital status: Married    Spouse name: Not on file  . Number of children: Not on file  . Years of education: Not on file  . Highest education level: Not on file  Occupational History  . Not on file  Social Needs  . Financial resource strain: Not on file  . Food insecurity:    Worry: Not on  file    Inability: Not on file  . Transportation needs:    Medical: Not on file    Non-medical: Not on file  Tobacco Use  . Smoking status: Never Smoker  . Smokeless tobacco: Never Used  Substance and Sexual Activity  . Alcohol use: Never    Frequency: Never  . Drug use: Never  . Sexual activity: Not on file  Lifestyle  . Physical activity:    Days per week: Not on file    Minutes per session: Not on file  . Stress: Not on file  Relationships  . Social connections:    Talks on phone: Not on file    Gets  together: Not on file    Attends religious service: Not on file    Active member of club or organization: Not on file    Attends meetings of clubs or organizations: Not on file    Relationship status: Not on file  . Intimate partner violence:    Fear of current or ex partner: Not on file    Emotionally abused: Not on file    Physically abused: Not on file    Forced sexual activity: Not on file  Other Topics Concern  . Not on file  Social History Narrative  . Not on file     FAMILY HISTORY:  History reviewed. No pertinent family history.    REVIEW OF SYSTEMS:  Review of Systems  Constitutional: Negative for chills and fever.  HENT: Negative for congestion and sinus pain.   Respiratory: Negative for cough and shortness of breath.   Cardiovascular: Negative for chest pain and leg swelling.  Gastrointestinal: Positive for nausea and vomiting. Negative for abdominal pain, blood in stool, constipation and diarrhea.  Genitourinary: Negative for dysuria and urgency.  All other systems reviewed and are negative.   VITAL SIGNS:  Temp:  [98.6 F (37 C)-98.7 F (37.1 C)] 98.7 F (37.1 C) (04/13 0056) Pulse Rate:  [72-138] 72 (04/13 0056) Resp:  [13-26] 20 (04/13 0056) BP: (122-142)/(55-76) 142/76 (04/13 0056) SpO2:  [91 %-99 %] 91 % (04/13 0056) Weight:  [48.4 kg-49.9 kg] 48.4 kg (04/13 0035)     Height:  (157.5 cm) Weight: 48.4 kg BMI (Calculated): 19.51   INTAKE/OUTPUT:  This shift: No intake/output data recorded.   PHYSICAL EXAM:  Physical Exam Vitals signs and nursing note reviewed. Exam conducted with a chaperone present.  Constitutional:      General: She is not in acute distress.    Appearance: She is well-developed and normal weight. She is not ill-appearing.  HENT:     Head: Normocephalic and atraumatic.     Comments: NGT in place Eyes:     General: No scleral icterus.    Extraocular Movements: Extraocular movements intact.  Cardiovascular:     Rate and  Rhythm: Normal rate and regular rhythm.     Heart sounds: Normal heart sounds. No murmur. No friction rub. No gallop.   Pulmonary:     Effort: Pulmonary effort is normal. No respiratory distress.     Breath sounds: Normal breath sounds. No wheezing.  Abdominal:     General: Abdomen is flat. A surgical scar is present.     Palpations: Abdomen is soft.     Tenderness: There is no abdominal tenderness. There is no guarding or rebound.     Comments: Well healed upper midline incision  Genitourinary:    Comments: Deferred Skin:    General: Skin is warm and dry.  Coloration: Skin is not jaundiced or pale.  Neurological:     General: No focal deficit present.     Mental Status: She is alert.       Labs:  CBC Latest Ref Rng & Units 06/02/2018 08/19/2006 08/17/2006  WBC 4.0 - 10.5 K/uL 16.2(H) 7.5 4.4  Hemoglobin 12.0 - 15.0 g/dL 16.2(H) 10.0(L) 9.1(L)  Hematocrit 36.0 - 46.0 % 48.2(H) 30.0(L) 27.1(L)  Platelets 150 - 400 K/uL 176 273 176   CMP Latest Ref Rng & Units 06/02/2018 08/23/2006 08/22/2006  Glucose 70 - 99 mg/dL 413(K219(H) 440(N128(H) 027(O124(H)  BUN 8 - 23 mg/dL 11 6 9   Creatinine 0.44 - 1.00 mg/dL 5.360.75 6.440.77 0.340.69  Sodium 135 - 145 mmol/L 136 134(L) 134(L)  Potassium 3.5 - 5.1 mmol/L 3.0(L) 3.7 3.4(L)  Chloride 98 - 111 mmol/L 89(L) 101 101  CO2 22 - 32 mmol/L 36(H) 27 27  Calcium 8.9 - 10.3 mg/dL 9.6 8.5 8.4  Total Protein 6.5 - 8.1 g/dL 7.1 - -  Total Bilirubin 0.3 - 1.2 mg/dL 0.9 - -  Alkaline Phos 38 - 126 U/L 65 - -  AST 15 - 41 U/L 20 - -  ALT 0 - 44 U/L 18 - -     Imaging studies:   Attempted to review further prior imaging through [facility]. Unable to view images or reports.  CT Abdomen/Pelvis (05/29/2018) personally reviewed and radiologist report reviewed:  IMPRESSION: 1) Distended right and transverse colon without wall thickening or inflammation and without convincing bowel obstruction. Small bowel is normal in caliber with no wall thickening or inflammation. Moderate  distention of the stomach with no evidence of stomach inflammation. There are colonic and small bowel air-fluid levels. This suggests an adynamic ileus. 2) There are chronic findings including changes from a cholecystectomy and aortic atherosclerosis and changes from lumbar spine fusion surgery.  CT Abdomen/Pelvis (06/02/2018) personally reviewed and radiologist report reviewed:  IMPRESSION: 1) Mild gastric distention is noted. 2) No definite evidence of large or small bowel obstruction or inflammation. 3) Aortic Atherosclerosis (ICD10-I70.0).  KUB (06/03/2018) personally reviewed and radiologist report reviewed:  IMPRESSION: Enteric tube tip projects over proximal stomach.   Assessment/Plan: (ICD-10's: 21K56.7) 85 y.o. female with 2+ weeks of intractable nausea and emesis that failed outpatient management which has improved with NGT placement most likely attributable to ileus possibly secondary to UTI -vs- possible stenosis of gastrojejunostomy anastomosis -vs- a combination of both, complicated by pertinent comorbidities including HTN, HLD, mild dementia, and advanced age.   - Continue NPO + IVF  - NGT for decompression  - antiemetics prn  - Monitor abdominal examination; NGT output; ongoing bowel function   - Recommend GI consultation for possible EGD to evaluate gastrojejunostomy anastomosis   - No indication for emergent surgical intervention  - medical management per primary team otherwise  All of the above findings and recommendations were discussed with the patient and the medical team, and all of patient's questions were answered to her expressed satisfaction.  Thank you for the opportunity to participate in this patient's care.   -- Lynden OxfordZachary , PA-C [employer] [name] 06/03/2018, 8:41 AM [id number] M-F: 7am - 4pm

## 2018-06-03 NOTE — H&P (Signed)
[facility] [place] at [facility]   PATIENT NAME: [name]    MR#:  [id number]  DATE OF BIRTH:  1932/12/28  DATE OF ADMISSION:  06/02/2018  PRIMARY CARE PHYSICIAN: Dr Judithann Sheen REQUESTING/REFERRING PHYSICIAN: Dr Roxan Hockey  CHIEF COMPLAINT:  dry heaves nausea and vomiting on and off for 10 to 12 days.  HISTORY OF PRESENT ILLNESS:  [name]  is a 85 y.o. female with a known history of mild dementia, [name], hypertension, high cholesterol comes to the emergency room accompanied by patient's sister with above chief complaint. Patient has been managed as outpatient since April 1 with nausea vomiting and dry heaving by Dr. Judithann Sheen. CT scan of the abdomen was done on eighth of April that showed distention in her margin to [place]. Patient was tried to manage at home however symptoms gotten worse came to the emergency room with intractable nausea and unable to keep anything orally. CT scan of the abdomen was done today which shows gastric distention. NG tube is been placed.  Patient is being admitted with nausea vomiting dehydration and possible ileus.  Patient sister tells me Dr. Lemar Livings had done a major surgery in 2012 where she had seems like gastric outlet obstruction. Unable to obtain all records through epic.  Surgical consultation has been placed for Dr. Wonda Horner  PAST MEDICAL HISTORY:   Past Medical History:  Diagnosis Date  . GERD (gastroesophageal reflux disease)   . High cholesterol   . Hypertension     PAST SURGICAL HISTOIRY:   Past Surgical History:  Procedure Laterality Date  . ABDOMINAL SURGERY    . BACK SURGERY    . CHOLECYSTECTOMY    . REPLACEMENT TOTAL KNEE Right   . TOE AMPUTATION Left    great toe  . TONSILLECTOMY      SOCIAL HISTORY:   Social History   Tobacco Use  . Smoking status: Never Smoker  . Smokeless tobacco: Never Used  Substance Use Topics  . Alcohol use: Never    Frequency: Never    FAMILY HISTORY:   History reviewed. No pertinent family history.  DRUG ALLERGIES:   Allergies  Allergen Reactions  . Colesevelam Other (See Comments)  . Lovastatin Other (See Comments)  . Tetracyclines & Related Rash    REVIEW OF SYSTEMS:  Review of Systems  Constitutional: Negative for chills, fever and weight loss.  HENT: Negative for ear discharge, ear pain and nosebleeds.   Eyes: Negative for blurred vision, pain and discharge.  Respiratory: Negative for sputum production, shortness of breath, wheezing and stridor.   Cardiovascular: Negative for chest pain, palpitations, orthopnea and PND.  Gastrointestinal: Positive for abdominal pain, nausea and vomiting. Negative for diarrhea.  Genitourinary: Negative for frequency and urgency.  Musculoskeletal: Negative for back pain and joint pain.  Neurological: Positive for weakness. Negative for sensory change, speech change and focal weakness.  Psychiatric/Behavioral: Negative for depression and hallucinations. The patient is not nervous/anxious.      MEDICATIONS AT HOME:   Prior to Admission medications   Medication Sig Start Date End Date Taking? Authorizing Provider  amLODipine (NORVASC) 5 MG tablet Take 1 tablet by mouth daily. 01/05/16  Yes [provider]  atorvastatin (LIPITOR) 20 MG tablet Take 1 tablet by mouth at bedtime. 09/12/16  Yes [provider]  ciprofloxacin (CIPRO) 500 MG tablet Take 1 tablet by mouth 2 (two) times daily. 05/27/18 06/03/18 Yes [provider]  fentaNYL (DURAGESIC) 100 MCG/HR Place 1 patch onto the skin every 3 (  three) days. 03/12/18  Yes [provider]  folic acid (FOLVITE) 1 MG tablet Take 1 tablet by mouth daily. 04/10/18  Yes [provider]  omeprazole (PRILOSEC) 20 MG capsule Take by mouth. 07/05/15  Yes [provider]  oxyCODONE-acetaminophen (PERCOCET) 10-325 MG tablet Take 1 tablet by mouth every 8 (eight) hours as needed for pain.  03/12/18  Yes [provider]  vitamin B-12 (CYANOCOBALAMIN) 1000 MCG tablet Take 1 tablet by mouth daily.   Yes [provider]  dicyclomine (BENTYL) 10 MG capsule Take 10 mg by mouth 3 (three) times daily before meals.  05/22/18 06/01/18  [provider]      VITAL SIGNS:  Blood pressure 122/60, pulse 79, temperature 98.6 F (37 C), temperature source Oral, resp. rate 13, height  (1.6 m), weight 49.9 kg, SpO2 99 %.  PHYSICAL EXAMINATION:  GENERAL:  85 y.o.-year-old patient lying in the bed with no acute distress. thin EYES: Pupils equal, round, reactive to light and accommodation. No scleral icterus. Extraocular muscles intact.  HEENT: Head atraumatic, normocephalic. Oropharynx and nasopharynx clear. NG+ NECK:  Supple, no jugular venous distention. No thyroid enlargement, no tenderness.  LUNGS: Normal breath sounds bilaterally, no wheezing, rales,rhonchi or crepitation. No use of accessory muscles of respiration.  CARDIOVASCULAR: S1, S2 normal. No murmurs, rubs, or gallops.  ABDOMEN: Soft, nontender, + distended. Very few Bowel sounds present. No organomegaly or mass.  EXTREMITIES: No pedal edema, cyanosis, or clubbing.  NEUROLOGIC: overall nonfocal. Patient is uncomfortable with the NG tube and intractable nausea and not able to do detail exam. She moves all her extremities very well PSYCHIATRIC: The patient is alert and awake SKIN: No obvious rash, lesion, or ulcer.   LABORATORY PANEL:   CBC Recent Labs  Lab 06/02/18 2146  WBC 16.2*  HGB 16.2*  HCT 48.2*  PLT 176   ------------------------------------------------------------------------------------------------------------------  Chemistries  Recent Labs  Lab 06/02/18 2146  NA 136  K 3.0*  CL 89*  CO2 36*  GLUCOSE 219*  BUN 11  CREATININE 0.75  CALCIUM 9.6  AST 20  ALT 18  ALKPHOS 65  BILITOT 0.9    ------------------------------------------------------------------------------------------------------------------  Cardiac Enzymes No results for input(s): TROPONINI in the last 168 hours. ------------------------------------------------------------------------------------------------------------------  RADIOLOGY:  Dg Abdomen 1 View  Result Date: 06/03/2018 CLINICAL DATA:  85 y/o  F; NG tube placement. EXAM: ABDOMEN - 1 VIEW COMPARISON:  06/02/2018 CT abdomen and pelvis. FINDINGS: Enteric tube tip projects over the proximal stomach. Right upper quadrant cholecystectomy clips. Lumbar fusion hardware noted. Bones are demineralized. There is contrast accumulation within the bladder. Nonobstructive bowel gas pattern. IMPRESSION: Enteric tube tip projects over proximal stomach. Electronically Signed   By: Mitzi Hansen M.D.   On: 06/03/2018 00:33   Ct Abdomen Pelvis W Contrast  Result Date: 06/02/2018 CLINICAL DATA:  Acute generalized abdominal pain. EXAM: CT ABDOMEN AND PELVIS WITH CONTRAST TECHNIQUE: Multidetector CT imaging of the abdomen and pelvis was performed using the standard protocol following bolus administration of intravenous contrast. CONTRAST:  60mL OMNIPAQUE IOHEXOL 300 MG/ML  SOLN COMPARISON:  CT scan of May 29, 2018. FINDINGS: Lower chest: No acute abnormality. Hepatobiliary: No focal liver abnormality is seen. Status post cholecystectomy. No biliary dilatation. Pancreas: Unremarkable. No pancreatic ductal dilatation or surrounding inflammatory changes. Spleen: Normal in size without focal abnormality. Adrenals/Urinary Tract: Adrenal glands appear normal. Left renal cyst is noted. No hydronephrosis or renal obstruction is noted. No renal or ureteral calculi are noted. Urinary bladder is  unremarkable. Stomach/Bowel: Mild gastric distention is noted. No definite evidence of large or small bowel obstruction or inflammation is noted. The appendix is not visualized.  Vascular/Lymphatic: Aortic atherosclerosis. No enlarged abdominal or pelvic lymph nodes. Reproductive: Uterus and bilateral adnexa are unremarkable. Other: No abdominal wall hernia or abnormality. No abdominopelvic ascites. Musculoskeletal: No acute or significant osseous findings. IMPRESSION: Mild gastric distention is noted. No definite evidence of large or small bowel obstruction or inflammation. Aortic Atherosclerosis (ICD10-I70.0). Electronically Signed   By: Lupita RaiderJames  Green Jr, M.D.   On: 06/02/2018 23:23    EKG:    IMPRESSION AND PLAN:  [name]  is a 85 y.o. female with a known history of mild dementia, Brittany RifeGerd, hypertension, high cholesterol comes to the emergency room accompanied by patient's sister with above chief complaint. Patient has been managed as outpatient since April 1 with nausea vomiting and dry heaving by Dr. Judithann SheenSparks.   1. 12 nausea vomiting abdominal distention and very poor PO intake with dehydration suspected due to ileus rule out small bowel obstruction -NG tube placed -to suction once x-ray confirmed -surgical consultation with Dr. Elmon ElsePavone/Piscoya--- haiku message sent -NPO -IV fluids -according to the sister patient had it seems like gastric outlet obstruction surgery in 2012 by Dr. Lemar LivingsByrnett. Details not available.  2. Leukocytosis likely reactive  3. Hypokalemia/hypochloremia poor PO intake -iV fluids to replete potassium  4. Hyperglycemia without diagnosis of diabetes -since hemoglobin A1c is 6.2 in February 2020 -I will continue to monitor. Since patient is not taking anything orally will avoid sliding scale insulin  5. DVT prophylaxis subcu Lovenox  Discussed with sister in the ER All the records are reviewed and case discussed with ED provider.   CODE STATUS DNR TOTAL TIME TAKING CARE OF THIS PATIENT: 50 minutes.    Enedina FinnerSona Eowyn Tabone M.D on 06/03/2018 at 12:38 AM  Between 7am to 6pm - Pager - [phone]  After 6pm go to [email or web address] - password [email or web address]  [facility]  [facility]  Office  [phone]  CC: Primary care physician; Default, Provider, MD

## 2018-06-03 NOTE — ED Notes (Addendum)
ED TO INPATIENT HANDOFF REPORT  ED Nurse Name and Phone #: Dewayne Hatch [phone]  S Name/Age/Gender [name] 85 y.o. female Room/Bed: ED24A/ED24A  Code Status   Code Status: Not on file  Home/SNF/Other Home Patient oriented to: self Is this baseline? Yes   Triage Complete: Triage complete  Chief Complaint Abdominal Pain; diarrhea  Triage Note Pt here for c/o generalized aching abd pain x 2 weeks; pt has seen Dr Judithann Sheen 3 times since this started; pt currently taking Cipro for UTI; pt on Fentanyl patch and oxycodone for back pain related to surgery years ago; pt says she had a good bowel movement this evening but does have intermittent constipation; denies urinary s/s; sister reports pt did vomit a lot of liquid brown on the way to the ED; sister says pt has been weak and that she is probably dehydrated; decreased oral intake;    Allergies Allergies  Allergen Reactions  . Colesevelam Other (See Comments)  . Lovastatin Other (See Comments)  . Tetracyclines & Related Rash    Level of Care/Admitting Diagnosis ED Disposition    ED Disposition Condition Comment   Admit  Hospital Area: [facility] [[id number]]  Level of Care: Med-Surg [16]  Diagnosis: Ileus [facility]) [[id number]]  Admitting Physician: Joselyn Glassman  Attending Physician: Joselyn Glassman  Estimated length of stay: past midnight tomorrow  Certification:: I certify this patient will need inpatient services for at least 2 midnights  PT Class (Do Not Modify): Inpatient [101]  PT Acc Code (Do Not Modify): Private [1]       B Medical/Surgery History Past Medical History:  Diagnosis Date  . GERD (gastroesophageal reflux disease)   . High cholesterol   . Hypertension    Past Surgical History:  Procedure Laterality Date  . ABDOMINAL SURGERY    . BACK SURGERY    . CHOLECYSTECTOMY    . REPLACEMENT TOTAL KNEE Right   . TOE AMPUTATION Left    great toe  . TONSILLECTOMY       A IV  Location/Drains/Wounds Patient Lines/Drains/Airways Status   Active Line/Drains/Airways    Name:   Placement date:   Placement time:   Site:   Days:   Peripheral IV 06/02/18 Left Arm   06/02/18    2213    Arm   1   NG/OG Tube Nasogastric 18 Fr. Right nare Aucultation Documented cm marking at nare/ corner of mouth 61 cm   06/03/18    0010    Right nare   less than 1          Intake/Output Last 24 hours  Intake/Output Summary (Last 24 hours) at 06/03/2018 0017 Last data filed at 06/02/2018 2342 Gross per 24 hour  Intake 251.01 ml  Output -  Net 251.01 ml    Labs/Imaging Results for orders placed or performed during the hospital encounter of 06/02/18 (from the past 48 hour(s))  Lipase, blood     Status: None   Collection Time: 06/02/18  9:46 PM  Result Value Ref Range   Lipase 25 11 - 51 U/L    Comment: Performed at [facility], [address], [place], [place] [address]  Comprehensive metabolic panel     Status: Abnormal   Collection Time: 06/02/18  9:46 PM  Result Value Ref Range   Sodium 136 135 - 145 mmol/L   Potassium 3.0 (L) 3.5 - 5.1 mmol/L   Chloride 89 (L) 98 - 111 mmol/L   CO2 36 (  H) 22 - 32 mmol/L   Glucose, Bld 219 (H) 70 - 99 mg/dL   BUN 11 8 - 23 mg/dL   Creatinine, Ser 2.950.75 0.44 - 1.00 mg/dL   Calcium 9.6 8.9 - 62.110.3 mg/dL   Total Protein 7.1 6.5 - 8.1 g/dL   Albumin 3.9 3.5 - 5.0 g/dL   AST 20 15 - 41 U/L   ALT 18 0 - 44 U/L   Alkaline Phosphatase 65 38 - 126 U/L   Total Bilirubin 0.9 0.3 - 1.2 mg/dL   GFR calc non Af Amer >60 >60 mL/min   GFR calc Af Amer >60 >60 mL/min   Anion gap 11 5 - 15    Comment: Performed at [facility], [address] [place], [place] [address]  CBC     Status: Abnormal   Collection Time: 06/02/18  9:46 PM  Result Value Ref Range   WBC 16.2 (H) 4.0 - 10.5 K/uL   RBC 5.41 (H) 3.87 - 5.11 MIL/uL   Hemoglobin 16.2 (H) 12.0 - 15.0 g/dL   HCT 78.448.2 (H) 69.636.0 - 29.546.0 %   MCV 89.1 80.0 - 100.0 fL   MCH 29.9  26.0 - 34.0 pg   MCHC 33.6 30.0 - 36.0 g/dL   RDW 28.412.9 13.211.5 - 44.015.5 %   Platelets 176 150 - 400 K/uL   nRBC 0.0 0.0 - 0.2 %    Comment: Performed at [facility], [address] [place], [place] [address]  Urinalysis, Complete w Microscopic     Status: Abnormal   Collection Time: 06/02/18 11:44 PM  Result Value Ref Range   Color, Urine YELLOW (A) YELLOW   APPearance HAZY (A) CLEAR   Specific Gravity, Urine 1.042 (H) 1.005 - 1.030   pH 7.0 5.0 - 8.0   Glucose, UA NEGATIVE NEGATIVE mg/dL   Hgb urine dipstick MODERATE (A) NEGATIVE   Bilirubin Urine NEGATIVE NEGATIVE   Ketones, ur 5 (A) NEGATIVE mg/dL   Protein, ur 30 (A) NEGATIVE mg/dL   Nitrite NEGATIVE NEGATIVE   Leukocytes,Ua NEGATIVE NEGATIVE   RBC / HPF >50 (H) 0 - 5 RBC/hpf   WBC, UA 0-5 0 - 5 WBC/hpf   Bacteria, UA NONE SEEN NONE SEEN   Squamous Epithelial / LPF 0-5 0 - 5   Mucus PRESENT    Hyaline Casts, UA PRESENT     Comment: Performed at [facility], [address] [place], [place] [address]   Ct Abdomen Pelvis W Contrast  Result Date: 06/02/2018 CLINICAL DATA:  Acute generalized abdominal pain. EXAM: CT ABDOMEN AND PELVIS WITH CONTRAST TECHNIQUE: Multidetector CT imaging of the abdomen and pelvis was performed using the standard protocol following bolus administration of intravenous contrast. CONTRAST:  60mL OMNIPAQUE IOHEXOL 300 MG/ML  SOLN COMPARISON:  CT scan of May 29, 2018. FINDINGS: Lower chest: No acute abnormality. Hepatobiliary: No focal liver abnormality is seen. Status post cholecystectomy. No biliary dilatation. Pancreas: Unremarkable. No pancreatic ductal dilatation or surrounding inflammatory changes. Spleen: Normal in size without focal abnormality. Adrenals/Urinary Tract: Adrenal glands appear normal. Left renal cyst is noted. No hydronephrosis or renal obstruction is noted. No renal or ureteral calculi are noted. Urinary bladder is unremarkable. Stomach/Bowel: Mild gastric distention is  noted. No definite evidence of large or small bowel obstruction or inflammation is noted. The appendix is not visualized. Vascular/Lymphatic: Aortic atherosclerosis. No enlarged abdominal or pelvic lymph nodes. Reproductive: Uterus and bilateral adnexa are unremarkable. Other: No abdominal wall hernia or abnormality. No  abdominopelvic ascites. Musculoskeletal: No acute or significant osseous findings. IMPRESSION: Mild gastric distention is noted. No definite evidence of large or small bowel obstruction or inflammation. Aortic Atherosclerosis (ICD10-I70.0). Electronically Signed   By: Lupita Raider, M.D.   On: 06/02/2018 23:23    Pending Labs Unresulted Labs (From admission, onward)    Start     Ordered   Signed and Held  CBC  (enoxaparin (LOVENOX)    CrCl >/= 30 ml/min)  Once,   R    Comments:  Baseline for enoxaparin therapy IF NOT ALREADY DRAWN.  Notify MD if PLT < 100 K.    Signed and Held   Signed and Held  Creatinine, serum  (enoxaparin (LOVENOX)    CrCl >/= 30 ml/min)  Once,   R    Comments:  Baseline for enoxaparin therapy IF NOT ALREADY DRAWN.    Signed and Held   Signed and Held  Creatinine, serum  (enoxaparin (LOVENOX)    CrCl >/= 30 ml/min)  Weekly,   R    Comments:  while on enoxaparin therapy    Signed and Held          Vitals/Pain Today's Vitals   06/02/18 2125 06/02/18 2126 06/02/18 2249  BP: (!) 134/55    Pulse: 92    Resp: 16    Temp: 98.6 F (37 C)    TempSrc: Oral    SpO2: 95%    Weight:  49.9 kg   Height:  5\' 3"  (1.6 m)   PainSc: 6   7     Isolation Precautions No active isolations  Medications Medications  sodium chloride flush (NS) 0.9 % injection 3 mL (3 mLs Intravenous Not Given 06/02/18 2155)  ondansetron (ZOFRAN) injection 4 mg (4 mg Intravenous Given 06/02/18 2248)  sodium chloride 0.9 % bolus 250 mL (0 mLs Intravenous Stopped 06/02/18 2342)  iohexol (OMNIPAQUE) 300 MG/ML solution 60 mL (60 mLs Intravenous Contrast Given 06/02/18 2257)     Mobility non-ambulatory High fall risk   Focused Assessments na   R Recommendations: See Admitting Provider Note  Report given to: Mindi Junker, RN  Additional Notes: na

## 2018-06-03 NOTE — Progress Notes (Signed)
   06/03/18 1100  Clinical Encounter Type  Visited With Patient not available  Visit Type Follow-up  Referral From Nurse  Consult/Referral To Chaplain  Chaplain attempted to complete OR, however, patient was on the way to X-Ray.

## 2018-06-03 NOTE — Consult Note (Signed)
Brittany Bouillon, MD [address], [address], [place], [place], [address] [address], [place], [place], [address] Phone: [phone]  Fax: [phone]  Consultation  Referring Provider:     Dr. Amado Coe Primary Care Physician:  Default, Provider, MD Reason for Consultation:     Ileus, nausea vomiting  Date of Admission:  06/02/2018 Date of Consultation:  06/03/2018         HPI:   [name] is a 85 y.o. female with 3-week history of nausea vomiting, with GI consult requested by Dr. Aleen Campi of surgery due to concern for possible anastomotic stricture.  Patient recently had a UTI and was started on Cipro as an outpatient.  She has had abdominal imaging since then that reports ileus.  Patient presented to the ED due to inability to tolerate p.o. intake.  An NG tube has been placed and patient feels better but no further nausea vomiting since NG tube placement.  Patient has history of partial gastric outlet obstruction, status post gastrojejunostomy with Dr. Lemar Livings in 2011.  CT abdomen reported mild gastric distention, with no definite evidence of large small bowel obstruction.  This was done yesterday.  Follow-up upper GI study today, reports initial lack of visualization of output of barium from the stomach into the jejunum.  Eventually the jejunum was visualized with slow flow of barium into the jejunum.  No obvious anastomotic ulcer or mass was seen, "although anastomotic stricture is not able to be fully visualized, the presence of stricture may be implicated by the relatively slow visualization of outflow into the jejunum."  Past Medical History:  Diagnosis Date  . GERD (gastroesophageal reflux disease)   . High cholesterol   . Hypertension     Past Surgical History:  Procedure Laterality Date  . ABDOMINAL SURGERY    . BACK SURGERY    . CHOLECYSTECTOMY    . REPLACEMENT TOTAL KNEE Right   . TOE AMPUTATION Left    great toe  . TONSILLECTOMY      Prior to  Admission medications   Medication Sig Start Date End Date Taking? Authorizing Provider  amLODipine (NORVASC) 5 MG tablet Take 1 tablet by mouth daily. 01/05/16  Yes [provider]  atorvastatin (LIPITOR) 20 MG tablet Take 1 tablet by mouth at bedtime. 09/12/16  Yes [provider]  ciprofloxacin (CIPRO) 500 MG tablet Take 1 tablet by mouth 2 (two) times daily. 05/27/18 06/03/18 Yes [provider]  fentaNYL (DURAGESIC) 100 MCG/HR Place 1 patch onto the skin every 3 (three) days. 03/12/18  Yes [provider]  folic acid (FOLVITE) 1 MG tablet Take 1 tablet by mouth daily. 04/10/18  Yes [provider]  omeprazole (PRILOSEC) 20 MG capsule Take by mouth. 07/05/15  Yes [provider]  oxyCODONE-acetaminophen (PERCOCET) 10-325 MG tablet Take 1 tablet by mouth every 8 (eight) hours as needed for pain.  03/12/18  Yes [provider]  vitamin B-12 (CYANOCOBALAMIN) 1000 MCG tablet Take 1 tablet by mouth daily.   Yes [provider]  dicyclomine (BENTYL) 10 MG capsule Take 10 mg by mouth 3 (three) times daily before meals.  05/22/18 06/01/18  [provider]    History reviewed. No pertinent family history.   Social History   Tobacco Use  . Smoking status: Never Smoker  . Smokeless tobacco: Never Used  Substance Use Topics  . Alcohol use: Never    Frequency: Never  . Drug use: Never    Allergies as  of 06/02/2018 - Review Complete 06/02/2018  Allergen Reaction Noted  . Colesevelam Other (See Comments) 06/02/2018  . Lovastatin Other (See Comments) 06/02/2018  . Tetracyclines & related Rash 06/02/2018    Review of Systems:    All systems reviewed and negative except where noted in HPI.   Physical Exam:  Vital signs in last 24 hours: Vitals:   06/03/18 0030 06/03/18 0035 06/03/18 0056 06/03/18 1221  BP: 122/60  (!) 142/76 (!) 135/57  Pulse:   72 74  Resp: 13  20 17   Temp:   98.7 F (37.1 C) 98.1 F (36.7 C)   TempSrc:   Oral Oral  SpO2:  99% 91% 98%  Weight:  48.4 kg    Height:  5\' 2"  (1.575 m)     Last BM Date: 06/01/18 General:   Pleasant, cooperative in NAD Head:  Normocephalic and atraumatic. Eyes:   No icterus.   Conjunctiva pink. PERRLA. Ears:  Normal auditory acuity. Neck:  Supple; no masses or thyroidomegaly Lungs: Respirations even and unlabored. Lungs clear to auscultation bilaterally.   No wheezes, crackles, or rhonchi.  Abdomen:  Soft, nondistended, nontender. Normal bowel sounds. No appreciable masses or hepatomegaly.  No rebound or guarding.  Neurologic:  Alert and oriented x3;  grossly normal neurologically. Skin:  Intact without significant lesions or rashes. Cervical Nodes:  No significant cervical adenopathy. Psych:  Alert and cooperative. Normal afAmadLyn RecordsAlmas: Recent Labs    04[facility] No acute abnormality. Hepatobiliary: No focal liver abnormality is seen. Status post cholecystectomy. No biliary dilatation. Pancreas: Unremarkable. No pancreatic ductal dilatation or surrounding inflammatory changes. Spleen: Normal in size without focal abnormality. Adrenals/Urinary Tract: Adrenal glands appear normal. Left renal cyst is noted. No hydronephrosis or renal obstruction is noted. No renal or ureteral calculi are noted. Urinary bladder is unremarkable. Stomach/Bowel: Mild gastric distention is noted. No definite evidence of large or small bowel obstruction or inflammation is noted. The appendix is not visualized. Vascular/Lymphatic: Aortic atherosclerosis. No enlarged  abdominal or pelvic lymph nodes. Reproductive: Uterus and bilateral adnexa are unremarkable. Other: No abdominal wall hernia or abnormality. No abdominopelvic ascites. Musculoskeletal: No acute or significant osseous findings. IMPRESSION: Mild gastric distention is noted. No definite evidence of large or small bowel obstruction or inflammation. Aortic Atherosclerosis (ICD10-I70.0). Electronically Signed   By: Lupita Raider, M.D.   On: 06/02/2018 23:23   Dg [name] W Single Cm (sol Or Thin Ba)  Result Date: 06/03/2018 CLINICAL DATA:  History of gastric outlet obstruction with prior gastrojejunostomy in 2011. EXAM: UPPER GI SERIES WITHOUT KUB TECHNIQUE: Routine upper GI series was performed with thin barium. FLUOROSCOPY TIME:  Fluoroscopy Time:  2 minutes and 12 seconds. COMPARISON:  Abdominal film on 06/03/2018, CT of the abdomen on 06/02/2018 and prior upper GI on 02/09/2010 FINDINGS: Thin barium was administered  through a pre-existing nasogastric tube which was positioned in the proximal stomach. Multiple fluoroscopic spot images were obtained in different positions. The stomach was initially decompressed by the pre-existing nasogastric tube. After initial partial distention of the stomach with barium, there was no visualized outflow into the jejunum. After the patient was rolled on her side, the jejunum was visualized. Different projections do not show obvious anastomotic ulceration, mass or leak. Anastomotic stricture may be present and is not able to be fully evaluated on the current study. However, the presence of stricture may be implicated by the relatively slow visualization of outflow into the jejunum. IMPRESSION: Initial lack of visualization of outflow of barium from the stomach into the jejunum. Eventually, the jejunum was visualized with slow flow of barium into the jejunum. No obvious anastomotic ulcer or mass. No evidence of leak. Although anastomotic stricture is not able to be fully visualized, the presence of stricture may be implicated by the relatively slow visualization of outflow into the jejunum. Electronically Signed   By: Irish Lack M.D.   On: 06/03/2018 12:45      Impression / Plan:   [name] is a 85 y.o. y/o female with history of partial gastric outlet obstruction, and surgery by Dr. Doristine Counter in 2011, gastrojejunostomy, with recent UTI, admitted with ileus and nausea vomiting for 3 weeks, with GI consulted for evaluation of EGD to rule out anastomotic stricture or ulcer  The slow transit of barium on the upper GI series is concerning for possible underlying narrowing/stricture/ulceration of the anastomosis area  Therefore, endoscopic visualization is indicated, as this would help with further management  Replace electrolytes as needed  I have discussed alternative options, risks & benefits,  which include, but are not limited to, bleeding, infection,  perforation,respiratory complication & drug reaction.  The patient agrees with this plan & written consent will be obtained.     Continue n.p.o. past midnight    Thank you for involving me in the care of this patient.      LOS: 1 day   Pasty Spillers, MD  06/03/2018, 1:08 PM

## 2018-06-04 ENCOUNTER — Inpatient Hospital Stay
Admit: 2018-06-04 | Discharge: 2018-06-04 | Disposition: A | Discharge status: Home / Self Care | Payer: Medicare Other | Attending: Internal Medicine | Admitting: Internal Medicine

## 2018-06-04 ENCOUNTER — Encounter
Admission: EM | Disposition: A | Discharge status: Home Health | Payer: Self-pay | Source: Home / Self Care | Attending: Internal Medicine

## 2018-06-04 ENCOUNTER — Inpatient Hospital Stay: Payer: Medicare Other | Admitting: Anesthesiology

## 2018-06-04 ENCOUNTER — Encounter: Payer: Self-pay | Admitting: Anesthesiology

## 2018-06-04 ENCOUNTER — Inpatient Hospital Stay: Payer: Self-pay

## 2018-06-04 DIAGNOSIS — K311 Adult hypertrophic pyloric stenosis: Principal | ICD-10-CM

## 2018-06-04 DIAGNOSIS — K317 Polyp of stomach and duodenum: Secondary | ICD-10-CM

## 2018-06-04 DIAGNOSIS — R112 Nausea with vomiting, unspecified: Secondary | ICD-10-CM

## 2018-06-04 DIAGNOSIS — K3189 Other diseases of stomach and duodenum: Secondary | ICD-10-CM

## 2018-06-04 DIAGNOSIS — R933 Abnormal findings on diagnostic imaging of other parts of digestive tract: Secondary | ICD-10-CM

## 2018-06-04 DIAGNOSIS — Z9889 Other specified postprocedural states: Secondary | ICD-10-CM

## 2018-06-04 HISTORY — PX: ESOPHAGOGASTRODUODENOSCOPY: SHX5428

## 2018-06-04 LAB — CBC
HCT: 39.2 % (ref 36.0–46.0)
Hemoglobin: 12.8 g/dL (ref 12.0–15.0)
MCH: 30.6 pg (ref 26.0–34.0)
MCHC: 32.7 g/dL (ref 30.0–36.0)
MCV: 93.8 fL (ref 80.0–100.0)
Platelets: 121 10*3/uL — ABNORMAL LOW (ref 150–400)
RBC: 4.18 MIL/uL (ref 3.87–5.11)
RDW: 13.2 % (ref 11.5–15.5)
WBC: 7 10*3/uL (ref 4.0–10.5)
nRBC: 0 % (ref 0.0–0.2)

## 2018-06-04 LAB — GLUCOSE, CAPILLARY: Glucose-Capillary: 92 mg/dL (ref 70–99)

## 2018-06-04 LAB — ECHOCARDIOGRAM COMPLETE
Height: 62 in
Weight: 1707.24 oz

## 2018-06-04 LAB — BASIC METABOLIC PANEL
Anion gap: 7 (ref 5–15)
BUN: 9 mg/dL (ref 8–23)
CO2: 28 mmol/L (ref 22–32)
Calcium: 8.5 mg/dL — ABNORMAL LOW (ref 8.9–10.3)
Chloride: 104 mmol/L (ref 98–111)
Creatinine, Ser: 0.68 mg/dL (ref 0.44–1.00)
GFR calc Af Amer: >60 mL/min (ref >60)
GFR calc non Af Amer: >60 mL/min (ref >60)
Glucose, Bld: 94 mg/dL (ref 70–99)
Potassium: 4.6 mmol/L (ref 3.5–5.1)
Sodium: 139 mmol/L (ref 135–145)

## 2018-06-04 LAB — MAGNESIUM: Magnesium: 2.3 mg/dL (ref 1.7–2.4)

## 2018-06-04 SURGERY — EGD (ESOPHAGOGASTRODUODENOSCOPY)
Anesthesia: General

## 2018-06-04 MED ORDER — SODIUM CHLORIDE 0.9 % IV SOLN: INTRAVENOUS | Status: DC

## 2018-06-04 MED ORDER — PROPOFOL 500 MG/50ML IV EMUL
INTRAVENOUS | Status: AC
  Filled 2018-06-04: qty 50

## 2018-06-04 MED ORDER — SODIUM CHLORIDE 0.9 % IV SOLN
INTRAVENOUS | Status: AC
  Administered 2018-06-04: 17:00:00 via INTRAVENOUS

## 2018-06-04 MED ORDER — FAT EMULSION PLANT BASED 20 % IV EMUL
250.0000 mL | INTRAVENOUS | Status: AC
  Administered 2018-06-04: 17:00:00 250 mL via INTRAVENOUS
  Filled 2018-06-04: qty 250

## 2018-06-04 MED ORDER — PANTOPRAZOLE SODIUM 40 MG IV SOLR
40.0000 mg | Freq: Two times a day (BID) | INTRAVENOUS | Status: DC
  Administered 2018-06-04 – 2018-06-06 (×4): 40 mg via INTRAVENOUS
  Filled 2018-06-04 (×4): qty 40

## 2018-06-04 MED ORDER — SODIUM CHLORIDE 0.9% FLUSH: 10.0000 mL | INTRAVENOUS | Status: DC | PRN

## 2018-06-04 MED ORDER — PROPOFOL 10 MG/ML IV BOLUS
INTRAVENOUS | Status: DC | PRN
  Administered 2018-06-04: 50 mg via INTRAVENOUS
  Administered 2018-06-04: 20 mg via INTRAVENOUS

## 2018-06-04 MED ORDER — PROPOFOL 500 MG/50ML IV EMUL
INTRAVENOUS | Status: DC | PRN
  Administered 2018-06-04: 100 ug/kg/min via INTRAVENOUS

## 2018-06-04 MED ORDER — LIDOCAINE HCL (PF) 2 % IJ SOLN
INTRAMUSCULAR | Status: DC | PRN
  Administered 2018-06-04: 40 mg via INTRADERMAL

## 2018-06-04 MED ORDER — TRACE MINERALS CR-CU-MN-SE-ZN 10-1000-500-60 MCG/ML IV SOLN
INTRAVENOUS | Status: AC
  Administered 2018-06-04: 17:00:00 via INTRAVENOUS
  Filled 2018-06-04: qty 960

## 2018-06-04 MED ORDER — SODIUM CHLORIDE 0.9 % IV SOLN
INTRAVENOUS | Status: AC
  Administered 2018-06-04 (×2): via INTRAVENOUS

## 2018-06-04 MED ORDER — SODIUM CHLORIDE 0.9% FLUSH
10.0000 mL | Freq: Two times a day (BID) | INTRAVENOUS | Status: DC
  Administered 2018-06-04: 10 mL
  Administered 2018-06-04: 20 mL
  Administered 2018-06-05 – 2018-06-07 (×4): 10 mL

## 2018-06-04 NOTE — Op Note (Signed)
[facility] Gastroenterology Patient Name: [name] Procedure Date: 06/04/2018 9:26 AM MRN: [id number] Account #: [id number] Date of Birth: Oct 28, 1932 Admit Type: Inpatient Age: 85 Room: [facility] ENDO ROOM 4 Gender: Female Note Status: Finalized Procedure:            Upper GI endoscopy Indications:          Abnormal UGI series, Nausea with vomiting Providers:            Milanya Sunderland B. Maximino Greenland MD, MD Referring MD:         Duane Lope. Judithann Sheen, MD (Referring MD) Medicines:            Monitored Anesthesia Care Complications:        No immediate complications. Procedure:            Pre-Anesthesia Assessment:                       - The risks and benefits of the procedure and the                        sedation options and risks were discussed with the                        patient. All questions were answered and informed                        consent was obtained.                       - Patient identification and proposed procedure were                        verified prior to the procedure.                       - ASA Grade Assessment: III - A patient with severe                        systemic disease.                       After obtaining informed consent, the endoscope was                        passed under direct vision. Throughout the procedure,                        the patient's blood pressure, pulse, and oxygen                        saturations were monitored continuously. The Endoscope                        was introduced through the mouth, and advanced to the                        antrum of the stomach. The upper GI endoscopy was                        accomplished with ease. The patient tolerated the  procedure well. Findings:      LA Grade D (one or more mucosal breaks involving at least 75% of       esophageal circumference) esophagitis with no bleeding was found in the       distal esophagus.      A few 3 to 4 mm sessile  polyps with no bleeding and no stigmata of       recent bleeding were found in the stomach.      A deformity was found in the gastric antrum.      A severe stenosis was found in the stomach. This was non-traversed. The       area where the stomach connects to the small bowel appeared to have a       narrow opening that could not be traversed.      The NG tube was seen and left in place with the end in the stomach. Impression:           - LA Grade D reflux esophagitis.                       - A few gastric polyps.                       - Deformity in the gastric antrum.                       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                       - No specimens collected. Recommendation:       - Dr. Aleen Campi informed of findings. Follow up with                        surgery                       - Continue present medications.                       - The findings and recommendations were discussed with                        the patient.                       - Return patient to hospital ward for ongoing care.                       - Follow an antireflux regimen.                       - Use Protonix (pantoprazole) 40 mg IV daily. Procedure Code(s):    --- Professional ---                       [id number], 52, Esophagogastroduodenoscopy, flexible,                        transoral; diagnostic, including collection of                        specimen(s) by brushing or washing, when performed                        (  separate procedure) Diagnosis Code(s):    --- Professional ---                       K21.0, Gastro-esophageal reflux disease with esophagitis                       K31.7, Polyp of stomach and duodenum                       K31.89, Other diseases of stomach and duodenum                       R11.2, Nausea with vomiting, unspecified                       R93.3, Abnormal findings on diagnostic imaging of other                        parts of digestive tract CPT copyright 2019 American  Medical Association. All rights reserved. The codes documented in this report are preliminary and upon coder review may  be revised to meet current compliance requirements.  Melodie BouillonVarnita Ceferino Lang, MD Michel BickersVarnita B. Maximino Greenlandahiliani MD, MD 06/04/2018 10:09:07 AM This report has been signed electronically. Number of Addenda: 0 Note Initiated On: 06/04/2018 9:26 AM Estimated Blood Loss: Estimated blood loss: none.      [facility]

## 2018-06-04 NOTE — Progress Notes (Signed)
Advanced care plan. Purpose of the Encounter: CODE STATUS Parties in Attendance: Patient Patient's Decision Capacity: Good Subjective/Patient's story:  [name]  is a 85 y.o. female with a known history of hyperlipidemia, hypertension, GERD who recently had a endoscopy which showed a gastric stenosis.  Objective/Medical story Patient has nausea and vomiting.  Needs NG tube with suction.  Probably will need gastroenterology evaluation and dilatation of the stricture versus surgical exploration and a feeding tube. Goals of care determination:  Advance care directives goals of care treatment plan discussed.  Currently patient wants everything done which includes CPR, intubation and ventilator if the need arises CODE STATUS: Full code Time spent discussing advanced care planning: 16 minutes

## 2018-06-04 NOTE — Progress Notes (Signed)
Peripherally Inserted Central Catheter/Midline Placement  The IV Nurse has discussed with the patient and/or persons authorized to consent for the patient, the purpose of this procedure and the potential benefits and risks involved with this procedure.  The benefits include less needle sticks, lab draws from the catheter, and the patient may be discharged home with the catheter. Risks include, but not limited to, infection, bleeding, blood clot (thrombus formation), and puncture of an artery; nerve damage and irregular heartbeat and possibility to perform a PICC exchange if needed/ordered by physician.  Alternatives to this procedure were also discussed.  Bard Power PICC patient education guide, fact sheet on infection prevention and patient information card has been provided to patient /or left at bedside.    PICC/Midline Placement Documentation  PICC Double Lumen 06/04/18 PICC Right Brachial 32 cm 0 cm (Active)  Indication for Insertion or Continuance of Line Administration of hyperosmolar/irritating solutions (i.e. TPN, Vancomycin, etc.) 06/04/2018  2:59 PM  Exposed Catheter (cm) 0 cm 06/04/2018  2:59 PM  Site Assessment Clean;Dry;Intact 06/04/2018  2:59 PM  Lumen #1 Status Flushed;Saline locked;Blood return noted 06/04/2018  2:59 PM  Lumen #2 Status Flushed;Saline locked;Blood return noted 06/04/2018  2:59 PM  Dressing Type Transparent 06/04/2018  2:59 PM  Dressing Status Clean;Dry;Intact;Antimicrobial disc in place 06/04/2018  2:59 PM  Dressing Change Due 06/11/18 06/04/2018  2:59 PM       Ethelda Chick 06/04/2018, 3:00 PM

## 2018-06-04 NOTE — Transfer of Care (Signed)
Immediate Anesthesia Transfer of Care Note  Patient: [name]  Procedure(s) Performed: Procedure(s): ESOPHAGOGASTRODUODENOSCOPY (EGD) (N/A)  Patient Location: PACU and Endoscopy Unit  Anesthesia Type:General  Level of Consciousness: sedated  Airway & Oxygen Therapy: Patient Spontanous Breathing and Patient connected to nasal cannula oxygen  Post-op Assessment: Report given to RN and Post -op Vital signs reviewed and stable  Post vital signs: Reviewed and stable  Last Vitals:  Vitals:   06/04/18 0837 06/04/18 1003  BP: (!) 150/59 (!) 154/60  Pulse: (!) 58 (!) 58  Resp: 18   Temp: (!) 36.2 C (!) 36.2 C  SpO2: 96% 96%    Complications: No apparent anesthesia complications

## 2018-06-04 NOTE — Anesthesia Preprocedure Evaluation (Addendum)
Anesthesia Evaluation  Patient identified by MRN, date of birth, ID band Patient awake    Reviewed: Allergy & Precautions, H&P , NPO status , Patient's Chart, lab work & pertinent test results  History of Anesthesia Complications Negative for: history of anesthetic complications  Airway Mallampati: III  TM Distance: <3 FB Neck ROM: limited    Dental  (+) Poor Dentition, Missing, Edentulous Upper, Edentulous Lower   Pulmonary neg pulmonary ROS, neg shortness of breath,           Cardiovascular Exercise Tolerance: Good hypertension, (-) angina(-) Past MI and (-) DOE      Neuro/Psych negative neurological ROS  negative psych ROS   GI/Hepatic Neg liver ROS, GERD  Medicated and Controlled,  Endo/Other  negative endocrine ROS  Renal/GU negative Renal ROS  negative genitourinary   Musculoskeletal   Abdominal   Peds  Hematology negative hematology ROS (+)   Anesthesia Other Findings Patient is NPO appropriate and reports no nausea or vomiting today.  Past Medical History: No date: GERD (gastroesophageal reflux disease) No date: High cholesterol No date: Hypertension  Past Surgical History: No date: ABDOMINAL SURGERY No date: BACK SURGERY No date: CHOLECYSTECTOMY No date: REPLACEMENT TOTAL KNEE; Right No date: TOE AMPUTATION; Left     Comment:  great toe No date: TONSILLECTOMY  BMI    Body Mass Index:  19.52 kg/m      Reproductive/Obstetrics negative OB ROS                           Anesthesia Physical Anesthesia Plan  ASA: III  Anesthesia Plan: General   Post-op Pain Management:    Induction: Intravenous  PONV Risk Score and Plan: Propofol infusion and TIVA  Airway Management Planned: Natural Airway and Nasal Cannula  Additional Equipment:   Intra-op Plan:   Post-operative Plan:   Informed Consent: I have reviewed the patients History and Physical, chart, labs and  discussed the procedure including the risks, benefits and alternatives for the proposed anesthesia with the patient or authorized representative who has indicated his/her understanding and acceptance.     Dental Advisory Given  Plan Discussed with: Anesthesiologist, CRNA and Surgeon  Anesthesia Plan Comments: (Phone consent from daughter Lucila Maine at [phone], she said that the patients husband, her father is very hard of hearing and would have trouble comunicating over the phone      Daughter consented for risks of anesthesia including but not limited to:  - adverse reactions to medications - risk of intubation if required - damage to teeth, lips or other oral mucosa - sore throat or hoarseness - Damage to heart, brain, lungs or loss of life  She voiced understanding.)       Anesthesia Quick Evaluation

## 2018-06-04 NOTE — Consult Note (Signed)
PHARMACY CONSULT NOTE - FOLLOW UP  Pharmacy Consult for Electrolyte Monitoring and Replacement   Recent Labs: Potassium (mmol/L)  Date Value  06/04/2018 4.6   Magnesium (mg/dL)  Date Value  35/57/3220 2.3   Calcium (mg/dL)  Date Value  25/42/7062 8.5 (L)   Albumin (g/dL)  Date Value  37/62/8315 3.9   Phosphorus (mg/dL)  Date Value  17/61/6073 3.4   Sodium (mmol/L)  Date Value  06/04/2018 139     Assessment: Pharmacy has been consulted to monitor electrolytes for this 85yo female with possible ileus and a 10-12 day hx of n/v  Goal of Therapy:  Electrolytes wnl's  Plan:  K =  4.8, Mg = 2.3  No further replenishment warranted at this time.  Changed NS with KCl to straight NS.  Will recheck potassium with am labs and then sign off if value holds.  Albina Billet, PharmD, BCPS Clinical Pharmacist 06/04/2018 7:08 AM

## 2018-06-04 NOTE — Anesthesia Postprocedure Evaluation (Signed)
Anesthesia Post Note  Patient: [name]  Procedure(s) Performed: ESOPHAGOGASTRODUODENOSCOPY (EGD) (N/A )  Patient location during evaluation: Endoscopy Anesthesia Type: General Level of consciousness: awake and alert Pain management: pain level controlled Vital Signs Assessment: post-procedure vital signs reviewed and stable Respiratory status: spontaneous breathing, nonlabored ventilation, respiratory function stable and patient connected to nasal cannula oxygen Cardiovascular status: blood pressure returned to baseline and stable Postop Assessment: no apparent nausea or vomiting Anesthetic complications: no     Last Vitals:  Vitals:   06/04/18 1014 06/04/18 1024  BP: (!) 166/50 (!) 157/57  Pulse: (!) 50 (!) 43  Resp: 18 15  Temp:    SpO2: 99% 94%    Last Pain:  Vitals:   06/04/18 1024  TempSrc:   PainSc: 0-No pain                 Cleda Mccreedy Banesa Tristan

## 2018-06-04 NOTE — Progress Notes (Signed)
Patient's previous surgery report is not available.  Dr. Aleen Campi has been informed that the lumen past her stomach could not be traversed.  I discussed the above with Dr. Servando Snare as Dr. Aleen Campi has asked about dilation at the site of the anastomosis.  However, since I was not able to find the luminal opening itself, difficult to say if dilation would be successful under fluoroscopy for example.  Dr. Servando Snare will reattempt EGD tomorrow.  N.p.o. past midnight.  Above discussed with Dr. Juliene Pina and Dr. Aleen Campi.

## 2018-06-04 NOTE — Consult Note (Signed)
[facility] Medical Consultation  [name] MWN:[id number] DOB: 1932-07-06 DOA: 06/02/2018 PCP: Default, Provider, MD   Requesting physician: Denna Haggard MD Date of consultation: 06/04/2018 Reason for consultation: Preopclearance  CHIEF COMPLAINT:   Chief Complaint  Patient presents with  . Abdominal Pain    HISTORY OF PRESENT ILLNESS: [name]  is a 85 y.o. female with a known history of hyperlipidemia, hypertension, GERD who recently had a endoscopy which showed a gastric stenosis.  Patient currently under surgical service for nausea and vomiting.  Unable to eat and keep food down the stomach.  Patient currently has NG tube with suction.  Surgical service has consulted hospitalist service for evaluation.  Gastroenterology is on board.  Plan for endoscopy and dilatation of the stenosis tomorrow.  If that is not successful then other options for decompression and a feeding tube or open laparotomy with resection and anastomosis as per surgery.  Patient is elderly and frail.  No complaints of abdominal pain.  PAST MEDICAL HISTORY:   Past Medical History:  Diagnosis Date  . GERD (gastroesophageal reflux disease)   . High cholesterol   . Hypertension     PAST SURGICAL HISTORY:  Past Surgical History:  Procedure Laterality Date  . ABDOMINAL SURGERY    . BACK SURGERY    . CHOLECYSTECTOMY    . REPLACEMENT TOTAL KNEE Right   . TOE AMPUTATION Left    great toe  . TONSILLECTOMY      SOCIAL HISTORY:  Social History   Tobacco Use  . Smoking status: Never Smoker  . Smokeless tobacco: Never Used  Substance Use Topics  . Alcohol use: Never    Frequency: Never    FAMILY HISTORY: Mother and father deceased   DRUG ALLERGIES:  Allergies  Allergen Reactions  . Colesevelam Other (See Comments)  . Lovastatin Other (See Comments)  . Tetracyclines & Related Rash    REVIEW OF SYSTEMS:   CONSTITUTIONAL: No fever, fatigue or weakness.  EYES: No blurred or double  vision.  EARS, NOSE, AND THROAT: No tinnitus or ear pain.  RESPIRATORY: No cough, shortness of breath, wheezing or hemoptysis.  CARDIOVASCULAR: No chest pain, orthopnea, edema.  GASTROINTESTINAL: Has nausea, vomiting,  No diarrhea or abdominal pain.  GENITOURINARY: No dysuria, hematuria.  ENDOCRINE: No polyuria, nocturia,  HEMATOLOGY: No anemia, easy bruising or bleeding SKIN: No rash or lesion. MUSCULOSKELETAL: No joint pain or arthritis.   NEUROLOGIC: No tingling, numbness, weakness.  PSYCHIATRY: No anxiety or depression.   MEDICATIONS AT HOME:  Prior to Admission medications   Medication Sig Start Date End Date Taking? Authorizing Provider  amLODipine (NORVASC) 5 MG tablet Take 1 tablet by mouth daily. 01/05/16  Yes [provider]  atorvastatin (LIPITOR) 20 MG tablet Take 1 tablet by mouth at bedtime. 09/12/16  Yes [provider]  fentaNYL (DURAGESIC) 100 MCG/HR Place 1 patch onto the skin every 3 (three) days. 03/12/18  Yes [provider]  folic acid (FOLVITE) 1 MG tablet Take 1 tablet by mouth daily. 04/10/18  Yes [provider]  omeprazole (PRILOSEC) 20 MG capsule Take by mouth. 07/05/15  Yes [provider]  oxyCODONE-acetaminophen (PERCOCET) 10-325 MG tablet Take 1 tablet by mouth every 8 (eight) hours as needed for pain.  03/12/18  Yes [provider]  vitamin B-12 (CYANOCOBALAMIN) 1000 MCG tablet Take 1 tablet by mouth daily.   Yes [provider]  dicyclomine (BENTYL) 10 MG capsule Take 10 mg by mouth 3 (three) times daily before  meals.  05/22/18 06/01/18  [provider]      PHYSICAL EXAMINATION:   VITAL SIGNS: Blood pressure 136/69, pulse 61, temperature 98.4 F (36.9 C), temperature source Oral, resp. rate 15, height  (1.575 m), weight 48.4 kg, SpO2 100 %.  GENERAL:  85 y.o.-year-old patient lying in the bed with no acute distress.  EYES: Pupils equal, round, reactive to light and accommodation.  No scleral icterus. Extraocular muscles intact.  HEENT: Head atraumatic, normocephalic. Oropharynx and nasopharynx clear.  Has NGT tube to suction NECK:  Supple, no jugular venous distention. No thyroid enlargement, no tenderness.  LUNGS: Normal breath sounds bilaterally, no wheezing, rales,rhonchi or crepitation. No use of accessory muscles of respiration.  CARDIOVASCULAR: S1, S2 normal. No murmurs, rubs, or gallops.  ABDOMEN: Soft, nontender, nondistended. Bowel sounds present. No organomegaly or mass.  EXTREMITIES: No pedal edema, cyanosis, or clubbing.  NEUROLOGIC: Cranial nerves II through XII are intact. Muscle strength 5/5 in all extremities. Sensation intact. Gait not checked.  PSYCHIATRIC: The patient is alert and oriented x 3.  SKIN: No obvious rash, lesion, or ulcer.   LABORATORY PANEL:   CBC Recent Labs  Lab 06/02/18 2146 06/03/18 0841 06/04/18 0244  WBC 16.2* 9.1 7.0  HGB 16.2* 14.4 12.8  HCT 48.2* 42.6 39.2  PLT 176 145* 121*  MCV 89.1 90.4 93.8  MCH 29.9 30.6 30.6  MCHC 33.6 33.8 32.7  RDW 12.9 13.1 13.2   ------------------------------------------------------------------------------------------------------------------  Chemistries  Recent Labs  Lab 06/02/18 2146 06/03/18 0841 06/04/18 0244  NA 136 138 139  K 3.0* 3.1* 4.6  CL 89* 95* 104  CO2 36* 35* 28  GLUCOSE 219* 128* 94  BUN CREATININE 0.75 0.70 0.68  CALCIUM 9.6 8.8* 8.5*  MG  --  1.8 2.3  AST 20  --   --   ALT 18  --   --   ALKPHOS 65  --   --   BILITOT 0.9  --   --    ------------------------------------------------------------------------------------------------------------------ estimated creatinine clearance is 40.7 mL/min (by C-G formula based on SCr of 0.68 mg/dL). ------------------------------------------------------------------------------------------------------------------ No results for input(s): TSH, T4TOTAL, T3FREE, THYROIDAB in the last 72 hours.  Invalid input(s):  FREET3   Coagulation profile No results for input(s): INR, PROTIME in the last 168 hours. ------------------------------------------------------------------------------------------------------------------- No results for input(s): DDIMER in the last 72 hours. -------------------------------------------------------------------------------------------------------------------  Cardiac Enzymes No results for input(s): CKMB, TROPONINI, MYOGLOBIN in the last 168 hours.  Invalid input(s): CK ------------------------------------------------------------------------------------------------------------------ Invalid input(s): POCBNP  ---------------------------------------------------------------------------------------------------------------  Urinalysis    Component Value Date/Time   COLORURINE YELLOW (A) 06/02/2018 2344   APPEARANCEUR HAZY (A) 06/02/2018 2344   LABSPEC 1.042 (H) 06/02/2018 2344   PHURINE 7.0 06/02/2018 2344   GLUCOSEU NEGATIVE 06/02/2018 2344   HGBUR MODERATE (A) 06/02/2018 2344   BILIRUBINUR NEGATIVE 06/02/2018 2344   KETONESUR 5 (A) 06/02/2018 2344   PROTEINUR 30 (A) 06/02/2018 2344   UROBILINOGEN 0.2 08/03/2006 1158   NITRITE NEGATIVE 06/02/2018 2344   LEUKOCYTESUR NEGATIVE 06/02/2018 2344     RADIOLOGY: Dg Abdomen 1 View  Result Date: 06/03/2018 CLINICAL DATA:  85 y/o  F; NG tube placement. EXAM: ABDOMEN - 1 VIEW COMPARISON:  06/02/2018 CT abdomen and pelvis. FINDINGS: Enteric tube tip projects over the proximal stomach. Right upper quadrant cholecystectomy clips. Lumbar fusion hardware noted. Bones are demineralized. There is contrast accumulation within the bladder. Nonobstructive bowel gas pattern. IMPRESSION: Enteric tube tip projects over proximal stomach. Electronically Signed  By: Mitzi HansenLance  Furusawa-Stratton M.D.   On: 06/03/2018 00:33   Ct Abdomen Pelvis W Contrast  Result Date: 06/02/2018 CLINICAL DATA:  Acute generalized abdominal pain. EXAM: CT ABDOMEN  AND PELVIS WITH CONTRAST TECHNIQUE: Multidetector CT imaging of the abdomen and pelvis was performed using the standard protocol following bolus administration of intravenous contrast. CONTRAST:  60mL OMNIPAQUE IOHEXOL 300 MG/ML  SOLN COMPARISON:  CT scan of May 29, 2018. FINDINGS: Lower chest: No acute abnormality. Hepatobiliary: No focal liver abnormality is seen. Status post cholecystectomy. No biliary dilatation. Pancreas: Unremarkable. No pancreatic ductal dilatation or surrounding inflammatory changes. Spleen: Normal in size without focal abnormality. Adrenals/Urinary Tract: Adrenal glands appear normal. Left renal cyst is noted. No hydronephrosis or renal obstruction is noted. No renal or ureteral calculi are noted. Urinary bladder is unremarkable. Stomach/Bowel: Mild gastric distention is noted. No definite evidence of large or small bowel obstruction or inflammation is noted. The appendix is not visualized. Vascular/Lymphatic: Aortic atherosclerosis. No enlarged abdominal or pelvic lymph nodes. Reproductive: Uterus and bilateral adnexa are unremarkable. Other: No abdominal wall hernia or abnormality. No abdominopelvic ascites. Musculoskeletal: No acute or significant osseous findings. IMPRESSION: Mild gastric distention is noted. No definite evidence of large or small bowel obstruction or inflammation. Aortic Atherosclerosis (ICD10-I70.0). Electronically Signed   By: Lupita RaiderJames  Green Jr, M.D.   On: 06/02/2018 23:23   Dg Kayleen MemosUgi W Single Cm (sol Or Thin Ba)  Result Date: 06/03/2018 CLINICAL DATA:  History of gastric outlet obstruction with prior gastrojejunostomy in 2011. EXAM: UPPER GI SERIES WITHOUT KUB TECHNIQUE: Routine upper GI series was performed with thin barium. FLUOROSCOPY TIME:  Fluoroscopy Time:  2 minutes and 12 seconds. COMPARISON:  Abdominal film on 06/03/2018, CT of the abdomen on 06/02/2018 and prior upper GI on 02/09/2010 FINDINGS: Thin barium was administered through a pre-existing  nasogastric tube which was positioned in the proximal stomach. Multiple fluoroscopic spot images were obtained in different positions. The stomach was initially decompressed by the pre-existing nasogastric tube. After initial partial distention of the stomach with barium, there was no visualized outflow into the jejunum. After the patient was rolled on her side, the jejunum was visualized. Different projections do not show obvious anastomotic ulceration, mass or leak. Anastomotic stricture may be present and is not able to be fully evaluated on the current study. However, the presence of stricture may be implicated by the relatively slow visualization of outflow into the jejunum. IMPRESSION: Initial lack of visualization of outflow of barium from the stomach into the jejunum. Eventually, the jejunum was visualized with slow flow of barium into the jejunum. No obvious anastomotic ulcer or mass. No evidence of leak. Although anastomotic stricture is not able to be fully visualized, the presence of stricture may be implicated by the relatively slow visualization of outflow into the jejunum. Electronically Signed   By: Irish LackGlenn  Yamagata M.D.   On: 06/03/2018 12:45   [place] Ekg Site Rite  Result Date: 06/04/2018 If Site Rite image not attached, placement could not be confirmed due to current cardiac rhythm.   EKG: Orders placed or performed during the hospital encounter of 06/02/18  . ED EKG  . ED EKG  . EKG 12-Lead  . EKG 12-Lead    IMPRESSION AND PLAN: 85 yr old female patient with a known history of hyperlipidemia, hypertension, GERD who recently had a endoscopy which showed a gastric stenosis.   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 and NG  tube Might be a candidate for decompression and feeding tube if she is not successful with dilatation by gastroenterology Continue IV fluids  -GERD Continue proton pump inhibitor  -DVT prophylaxis with subcu Lovenox daily  -Intractable nausea and vomiting IV antiemetics  All the records are reviewed and case discussed with ED provider. Management plans discussed with the patient, family and they are in agreement.  CODE STATUS:Full code    Code Status Orders  (From admission, onward)         Start     Ordered   06/03/18 0049  Full code  Continuous     06/03/18 0048        Code Status History    This patient has a current code status but no historical code status.    Advance Directive Documentation     Most Recent Value  Type of Advance Directive  Healthcare Power of Attorney, Living will  Pre-existing out of facility DNR order (yellow form or pink MOST form)  -  "MOST" Form in Place?  -       TOTAL TIME TAKING CARE OF THIS PATIENT: 53 minutes.    Ihor Austin M.D on 06/04/2018 at 12:45 PM  Between 7am to 6pm - Pager - [phone]  After 6pm go to [email or web address] - password [email or web address]  [facility] Office  [phone]  CC: Primary care physician; Default, Provider, MD

## 2018-06-04 NOTE — Progress Notes (Signed)
*  PRELIMINARY RESULTS* Echocardiogram 2D Echocardiogram has been performed.  [name] 06/04/2018, 3:57 PM

## 2018-06-04 NOTE — Progress Notes (Addendum)
ADDENDUM 11:09 AM 06/04/18  - Reviewed patient's EGD with Dr Despina Hick, which was concerning for stenosis of previous gastrojejunostomy - Will discuss with GI about the possibility of dilation of the anastomosis - Will attempt to get operative reports from original gastrojejunostomy in 2011 and determine if the patient is a surgical candidate; consult medicine - In the interim will remain NPO, NGT decompression, consult for PICC and TPN for nutritional optimization, and PPI.     [facility] SURGICAL PROGRESS NOTE (cpt [phone])  Hospital Day(s): 2.   Post op day(s): Day of Surgery.   Interval History: Patient seen and examined, no acute events or new complaints overnight. Patient reports that she is feeling "fine" this morning. She denied any fever, chills, abdominal pain, nausea, or emesis. She has remained NPO. NGT output has slowed to only 350 ccs over last 24 hours. No reports of flatus but she did have a large BM yesterday. She is scheduled for EGD today with gastroenterology.    Review of Systems:  Constitutional: denies fever, chills  Respiratory: denies any shortness of breath  Cardiovascular: denies chest pain or palpitations  Gastrointestinal: denies abdominal pain, N/V, or diarrhea/and bowel function as per interval history Genitourinary: denies burning with urination or urinary frequency   Vital signs in last 24 hours: [min-max] current  Temp:  [98.1 F (36.7 C)-98.3 F (36.8 C)] 98.3 F (36.8 C) (04/14 0412) Pulse Rate:  [73-77] 77 (04/14 0412) Resp:  [17-19] 19 (04/14 0412) BP: (103-135)/(57-77) 105/77 (04/14 0412) SpO2:  [94 %-99 %] 99 % (04/14 0412)     Height: 5\' 2"  (157.5 cm) Weight: 48.4 kg BMI (Calculated): 19.51   Intake/Output this shift:  No intake/output data recorded.    Physical Exam:  Constitutional: alert, cooperative and no distress  HENT: normocephalic without obvious abnormality, NGT in place  Eyes: PERRL, EOM's grossly  intact and symmetric  Respiratory: breathing non-labored at rest  Gastrointestinal: soft, non-tender, and non-distended, no rebound/guarding.  Musculoskeletal: no edema or wounds, motor and sensation grossly intact, NT    Labs:  CBC Latest Ref Rng & Units 06/04/2018 06/03/2018 06/02/2018  WBC 4.0 - 10.5 K/uL 7.0 9.1 16.2(H)  Hemoglobin 12.0 - 15.0 g/dL 37.8 58.8 16.2(H)  Hematocrit 36.0 - 46.0 % 39.2 42.6 48.2(H)  Platelets 150 - 400 K/uL 121(L) 145(L) 176   CMP Latest Ref Rng & Units 06/04/2018 06/03/2018 06/02/2018  Glucose 70 - 99 mg/dL 94 502(D) 741(O)  BUN 8 - 23 mg/dL 9 10 11   Creatinine 0.44 - 1.00 mg/dL 8.78 6.76 7.20  Sodium 135 - 145 mmol/L 139 138 136  Potassium 3.5 - 5.1 mmol/L 4.6 3.1(L) 3.0(L)  Chloride 98 - 111 mmol/L 104 95(L) 89(L)  CO2 22 - 32 mmol/L 28 35(H) 36(H)  Calcium 8.9 - 10.3 mg/dL 9.4(B) 0.9(G) 9.6  Total Protein 6.5 - 8.1 g/dL - - 7.1  Total Bilirubin 0.3 - 1.2 mg/dL - - 0.9  Alkaline Phos 38 - 126 U/L - - 65  AST 15 - 41 U/L - - 20  ALT 0 - 44 U/L - - 18     Imaging studies:   UGI Series (06/03/2018) personally reviewed and radiologist report reviewed:  IMPRESSION: Initial lack of visualization of outflow of barium from the stomach into the jejunum. Eventually, the jejunum was visualized with slow flow of barium into the jejunum. No obvious anastomotic ulcer or mass. No evidence of leak. Although anastomotic stricture is not able to be fully visualized, the presence of  stricture may be implicated by the relatively slow visualization of outflow into the Jejunum.   Assessment/Plan: (ICD-10's: 33K56.7) 85 y.o. female with resolved hypokalemia and clinically appears improved with NGT placement for possible ileus vs possible stenosis of gastrojejunostomy anastomosis vs a combination of both, complicated by pertinent comorbidities including HTN, HLD, mild dementia, and advanced age.   - Continue NPO + IVF             - NGT for decompression              - antiemetics prn             - Monitor abdominal examination; NGT output; ongoing bowel function    - Appreciate GI consultation; plan for EGD today   - No indication for emergent surgical intervention             - medical management per primary team otherwise  All of the above findings and recommendations were discussed with the patient, and the medical team, and all of patient's questions were answered to her expressed satisfaction.   -- Lynden OxfordZachary Kiren Mcisaac, PA-C [facility] 06/04/2018, 8:18 AM [phone] M-F: 7am - 4pm

## 2018-06-04 NOTE — Anesthesia Procedure Notes (Signed)
Date/Time: 06/04/2018 9:33 AM Performed by: Stormy Fabian, CRNA Pre-anesthesia Checklist: Patient identified, Emergency Drugs available, Suction available and Patient being monitored Patient Re-evaluated:Patient Re-evaluated prior to induction Oxygen Delivery Method: Nasal cannula Induction Type: IV induction Dental Injury: Teeth and Oropharynx as per pre-operative assessment  Comments: Nasal cannula with etCO2 monitoring

## 2018-06-04 NOTE — Progress Notes (Addendum)
Nutrition Follow Up Note   DOCUMENTATION CODES:   Not applicable  INTERVENTION:    Initiate Clinimix 5/15 with electrolytes at 76ml/hr (Goal rate 60ml/hr once labs stable)  Initiate 20% lipids at 85ml/hr x 12 hours per day    Regimen @ goal rate will provide 1382kcal/day, 72g/day protein, volume    Add MVI daily and trace elements daily    Add IV thiamine 100mg  daily x 3 days   Pt at high refeeding risk; recommend check P, K, and Mg daily for 3 days after TPN iniatition.    Daily weights   Keep IVF + TPN rate @100ml /hr per MD  NUTRITION DIAGNOSIS:   Inadequate oral intake related to acute illness as evidenced by NPO status  GOAL:   Patient will meet greater than or equal to 90% of their needs  MONITOR:   Diet advancement, Labs, Weight trends, I & O's, Skin  REASON FOR ASSESSMENT:   Consult New TPN/TNA  ASSESSMENT:   85 y.o. female with a known history of mild dementia, Gerd, hypertension, high cholesterol, GOO s/p gastrojejunostomy in 2011 admitted with 2 week h/o nausea and vomiting. Pt noted to have adynamic ileus   RD working remotely.  Pt s/p EGD with concern for stenosis at previous gastrojejunostomy site. Plan for PICC line and TPN today. NGT in place with output. Plan for repeat EGD tomorrow. Daily weights.   Pt likely at high risk for malnutrition but unable to diagnose at this time  Medications reviewed and include: lovenox, fentanyl, protonix, NaCl @100ml /hr  Labs reviewed: K 4.6 wnl, P 3.4 wnl, Mg 2.3 wnl  Diet Order:   Diet Order            Diet NPO time specified Except for: Sips with Meds  Diet effective midnight        Diet NPO time specified  Diet effective now             EDUCATION NEEDS:   Not appropriate for education at this time  Skin:  Skin Assessment: Reviewed RN Assessment  Last BM:  4/11  Height:   Ht Readings from Last 1 Encounters:  06/03/18 5\' 2"  (1.575 m)    Weight:   Wt Readings from Last 1  Encounters:  06/03/18 48.4 kg    Ideal Body Weight:  50 kg  BMI:  Body mass index is 19.52 kg/m.  Estimated Nutritional Needs:   Kcal:  1200-1400kcal/day   Protein:  63-72g/day   Fluid:  >1.2L/day   Betsey Holiday MS, RD, LDN Pager #- [phone] Office#- [phone] After Hours Pager: [phone]

## 2018-06-04 NOTE — Progress Notes (Signed)
[employer] - [place] at [facility]   PATIENT NAME: [name]    MR#:  [id number]  DATE OF BIRTH:  [birth year]  SUBJECTIVE:   Patient underwent EGD this morning EGD concerning for stenosis of previous gastrojejunostomy NGT output decreased. Patient reports she feels fine.  No nausea or vomiting.  No abdominal pain.  REVIEW OF SYSTEMS:    Review of Systems  Constitutional: Negative for fever, chills weight loss HENT: Negative for ear pain, nosebleeds, congestion, facial swelling, rhinorrhea, neck pain, neck stiffness and ear discharge.   Respiratory: Negative for cough, shortness of breath, wheezing  Cardiovascular: Negative for chest pain, palpitations and leg swelling.  Gastrointestinal: Negative for heartburn, abdominal pain, vomiting, diarrhea or consitpation Genitourinary: Negative for dysuria, urgency, frequency, hematuria Musculoskeletal: Negative for back pain or joint pain Neurological: Negative for dizziness, seizures, syncope, focal weakness,  numbness and headaches.  Hematological: Does not bruise/bleed easily.  Psychiatric/Behavioral: Negative for hallucinations, confusion, dysphoric mood    Tolerating Diet:NPO      DRUG ALLERGIES:   Allergies  Allergen Reactions  . Colesevelam Other (See Comments)  . Lovastatin Other (See Comments)  . Tetracyclines & Related Rash    VITALS:  Blood pressure 136/69, pulse 61, temperature 98.4 F (36.9 C), temperature source Oral, resp. rate 15, height 5\' 2"  (1.575 m), weight 48.4 kg, SpO2 100 %.  PHYSICAL EXAMINATION:  Constitutional: Appears well-developed and well-nourished. No distress. HENT: Normocephalic. Marland Kitchen NGT Eyes: Conjunctivae and EOM are normal. PERRLA, no scleral icterus.  Neck: Normal ROM. Neck supple. No JVD. No tracheal deviation. CVS: RRR, S1/S2 +, no murmurs, no gallops, no carotid bruit.  Pulmonary: Effort and breath sounds normal, no stridor, rhonchi, wheezes, rales.  Abdominal:  Soft. BS +,  no distension, tenderness, rebound or guarding.  Musculoskeletal: Normal range of motion. No edema and no tenderness.  Neuro: Alert. CN 2-12 grossly intact. No focal deficits. Skin: Skin is warm and dry. No rash noted. Psychiatric: Normal mood and affect.      LABORATORY PANEL:   CBC Recent Labs  Lab 06/04/18 0244  WBC 7.0  HGB 12.8  HCT 39.2  PLT 121*   ------------------------------------------------------------------------------------------------------------------  Chemistries  Recent Labs  Lab 06/02/18 2146  06/04/18 0244  NA 136   < > 139  K 3.0*   < > 4.6  CL 89*   < > 104  CO2 36*   < > 28  GLUCOSE 219*   < > 94  BUN 11   < > 9  CREATININE 0.75   < > 0.68  CALCIUM 9.6   < > 8.5*  MG  --    < > 2.3  AST 20  --   --   ALT 18  --   --   ALKPHOS 65  --   --   BILITOT 0.9  --   --    < > = values in this interval not displayed.   ------------------------------------------------------------------------------------------------------------------  Cardiac Enzymes No results for input(s): TROPONINI in the last 168 hours. ------------------------------------------------------------------------------------------------------------------  RADIOLOGY:  Dg Abdomen 1 View  Result Date: 06/03/2018 CLINICAL DATA:  85 y/o  F; NG tube placement. EXAM: ABDOMEN - 1 VIEW COMPARISON:  06/02/2018 CT abdomen and pelvis. FINDINGS: Enteric tube tip projects over the proximal stomach. Right upper quadrant cholecystectomy clips. Lumbar fusion hardware noted. Bones are demineralized. There is contrast accumulation within the bladder. Nonobstructive bowel gas pattern. IMPRESSION: Enteric tube tip projects over proximal stomach. Electronically Signed   By:  Lance Mitzi HansenD.   On: 06/03/2018 00:33   Ct Abdomen Pelvis W Contrast  Result Date: 06/02/2018 CLINICAL DATA:  Acute generalized abdominal pain. EXAM: CT ABDOMEN AND PELVIS WITH CONTRAST TECHNIQUE:  Multidetector CT imaging of the abdomen and pelvis was performed using the standard protocol following bolus administration of intravenous contrast. CONTRAST:  60mL OMNIPAQUE IOHEXOL 300 MG/ML  SOLN COMPARISON:  CT scan of May 29, 2018. FINDINGS: Lower chest: No acute abnormality. Hepatobiliary: No focal liver abnormality is seen. Status post cholecystectomy. No biliary dilatation. Pancreas: Unremarkable. No pancreatic ductal dilatation or surrounding inflammatory changes. Spleen: Normal in size without focal abnormality. Adrenals/Urinary Tract: Adrenal glands appear normal. Left renal cyst is noted. No hydronephrosis or renal obstruction is noted. No renal or ureteral calculi are noted. Urinary bladder is unremarkable. Stomach/Bowel: Mild gastric distention is noted. No definite evidence of large or small bowel obstruction or inflammation is noted. The appendix is not visualized. Vascular/Lymphatic: Aortic atherosclerosis. No enlarged abdominal or pelvic lymph nodes. Reproductive: Uterus and bilateral adnexa are unremarkable. Other: No abdominal wall hernia or abnormality. No abdominopelvic ascites. Musculoskeletal: No acute or significant osseous findings. IMPRESSION: Mild gastric distention is noted. No definite evidence of large or small bowel obstruction or inflammation. Aortic Atherosclerosis (ICD10-I70.0). Electronically Signed   By: Lupita Raider, M.D.   On: 06/02/2018 23:23   Dg Kayleen Memos W Single Cm (sol Or Thin Ba)  Result Date: 06/03/2018 CLINICAL DATA:  History of gastric outlet obstruction with prior gastrojejunostomy in 2011. EXAM: UPPER GI SERIES WITHOUT KUB TECHNIQUE: Routine upper GI series was performed with thin barium. FLUOROSCOPY TIME:  Fluoroscopy Time:  2 minutes and 12 seconds. COMPARISON:  Abdominal film on 06/03/2018, CT of the abdomen on 06/02/2018 and prior upper GI on 02/09/2010 FINDINGS: Thin barium was administered through a pre-existing nasogastric tube which was positioned in the  proximal stomach. Multiple fluoroscopic spot images were obtained in different positions. The stomach was initially decompressed by the pre-existing nasogastric tube. After initial partial distention of the stomach with barium, there was no visualized outflow into the jejunum. After the patient was rolled on her side, the jejunum was visualized. Different projections do not show obvious anastomotic ulceration, mass or leak. Anastomotic stricture may be present and is not able to be fully evaluated on the current study. However, the presence of stricture may be implicated by the relatively slow visualization of outflow into the jejunum. IMPRESSION: Initial lack of visualization of outflow of barium from the stomach into the jejunum. Eventually, the jejunum was visualized with slow flow of barium into the jejunum. No obvious anastomotic ulcer or mass. No evidence of leak. Although anastomotic stricture is not able to be fully visualized, the presence of stricture may be implicated by the relatively slow visualization of outflow into the jejunum. Electronically Signed   By: Irish Lack M.D.   On: 06/03/2018 12:45   [place] Ekg Site Rite  Result Date: 06/04/2018 If Site Rite image not attached, placement could not be confirmed due to current cardiac rhythm.    ASSESSMENT AND PLAN:    85 year old female with history of dementia and hypertension who presented to the ER due to nausea and vomiting.  1.  Nausea and vomiting with abdominal distention: This is due to stenosis of previous gastrojejunostomy versus possible ulcer.  Ulcer was not seen in the stomach however on EGD today due to stricture unable to see lumen. Plan to repeat EGD tomorrow. Surgery will attempt to get operative reports from  original gastrojejunostomy. Surgery has recommended PICC line placement and dietary consultation which has been placed for TPN for nutritional optimization. Continue n.p.o. status with NG tube for decompression  2   Hypokalemia: This is resolved 3.  Leukocytosis which is reactive in nature  4.  Hyperglycemia with A1c in February of 6.2  Management plans discussed with the patient and she is in agreement.  CODE STATUS: full  TOTAL TIME TAKING CARE OF THIS PATIENT: 22 minutes.     POSSIBLE D/C ??, DEPENDING ON CLINICAL CONDITION.   Adrian SaranSital Kay Shippy M.D on 06/04/2018 at 11:55 AM  Between 7am to 6pm - Pager - [phone] After 6pm go to [email or web address] - password [email or web address]  [facility] [place] [facility]  Office  [phone]  CC: Primary care physician; Default, Provider, MD  Note: This dictation was prepared with Dragon dictation along with smaller phrase technology. Any transcriptional errors that result from this process are unintentional.

## 2018-06-04 NOTE — Anesthesia Post-op Follow-up Note (Signed)
Anesthesia QCDR form completed.        

## 2018-06-04 NOTE — Consult Note (Signed)
PHARMACY - ADULT TOTAL PARENTERAL NUTRITION CONSULT NOTE   Pharmacy Consult for TPN Indication: SBO  Patient Measurements: Height: 5\' 2"  (157.5 cm) Weight: 106 lb 11.2 oz (48.4 kg) IBW/kg (Calculated) : 50.1 TPN AdjBW (KG): 48.4 Body mass index is 19.52 kg/m. Usual Weight: ~55kg  Assessment:  85 y.o. female with a known history of mild dementia, Gerd, hypertension, high cholesterol, GOO s/p gastrojejunostomy in 2011 admitted with 2 week h/o nausea and vomiting. Pt noted to have adynamic ileus   Pharmacy has been consulted to start TPN by surgery per dietician consult.    TPN Access: PICC line (being placed today 4/14)  TPN start date: 4/14  Nutritional Goals (per RD recommendation on 4/14):  KCal: 540-0867 kcal/day Protein: 48-72 g/day Fluid: 960-1426ml/day (1140-1670ml/day with Fat emulsion)   Starting rate 10ml/hr (provides 993ml/day) Goal TPN rate is 60 ml/hr (provides 1454ml/day)  Current Nutrition: NPO   Today's Plan:  Clinimix 5/15 with electrolytes TPN at 40 mL/hr x 24 hours starting 4/14 @ 1800  Fat Emulsion 20% at 21ml/hr x 12 hours per day  Baseline Electrolytes  K=4.6, Phos=3.4, Mg=2.3  Electrolytes in TPN: None at this time  Add MVI, trace elements, and thiamine 100mg  (1st 3 days only) to TPN   NS IVMF (D5, NS, etc.) at 45 ml/hr (to reduce to 34ml/hr when TPN reaches target rate of 44ml/hour  Monitor TPN labs - K, Phos, Mg daily for 3 days (high re-feed risk), then per protocol   F/U with am labs  Albina Billet, PharmD, BCPS Clinical Pharmacist 06/04/2018 12:21 PM

## 2018-06-05 ENCOUNTER — Inpatient Hospital Stay: Payer: Medicare Other | Admitting: Anesthesiology

## 2018-06-05 ENCOUNTER — Encounter
Admission: EM | Disposition: A | Discharge status: Home Health | Payer: Self-pay | Source: Home / Self Care | Attending: Internal Medicine

## 2018-06-05 DIAGNOSIS — K311 Adult hypertrophic pyloric stenosis: Secondary | ICD-10-CM

## 2018-06-05 DIAGNOSIS — K56699 Other intestinal obstruction unspecified as to partial versus complete obstruction: Secondary | ICD-10-CM

## 2018-06-05 HISTORY — PX: ESOPHAGOGASTRODUODENOSCOPY: SHX5428

## 2018-06-05 LAB — BASIC METABOLIC PANEL
Anion gap: 7 (ref 5–15)
BUN: 12 mg/dL (ref 8–23)
CO2: 28 mmol/L (ref 22–32)
Calcium: 8.3 mg/dL — ABNORMAL LOW (ref 8.9–10.3)
Chloride: 102 mmol/L (ref 98–111)
Creatinine, Ser: 0.56 mg/dL (ref 0.44–1.00)
GFR calc Af Amer: >60 mL/min (ref >60)
GFR calc non Af Amer: >60 mL/min (ref >60)
Glucose, Bld: 151 mg/dL — ABNORMAL HIGH (ref 70–99)
Potassium: 3.4 mmol/L — ABNORMAL LOW (ref 3.5–5.1)
Sodium: 137 mmol/L (ref 135–145)

## 2018-06-05 LAB — GLUCOSE, CAPILLARY
Glucose-Capillary: 129 mg/dL — ABNORMAL HIGH (ref 70–99)
Glucose-Capillary: 132 mg/dL — ABNORMAL HIGH (ref 70–99)
Glucose-Capillary: 144 mg/dL — ABNORMAL HIGH (ref 70–99)
Glucose-Capillary: 91 mg/dL (ref 70–99)

## 2018-06-05 LAB — MAGNESIUM: Magnesium: 2 mg/dL (ref 1.7–2.4)

## 2018-06-05 LAB — PHOSPHORUS: Phosphorus: 2.8 mg/dL (ref 2.5–4.6)

## 2018-06-05 SURGERY — EGD (ESOPHAGOGASTRODUODENOSCOPY)
Anesthesia: General

## 2018-06-05 MED ORDER — POTASSIUM CHLORIDE 10 MEQ/100ML IV SOLN
10.0000 meq | INTRAVENOUS | Status: AC
  Administered 2018-06-05 (×3): 10 meq via INTRAVENOUS
  Filled 2018-06-05 (×4): qty 100

## 2018-06-05 MED ORDER — OXYCODONE-ACETAMINOPHEN 10-325 MG PO TABS: 1.0000 | ORAL_TABLET | Freq: Three times a day (TID) | ORAL | Status: DC | PRN

## 2018-06-05 MED ORDER — DICYCLOMINE HCL 10 MG PO CAPS
10.0000 mg | ORAL_CAPSULE | Freq: Three times a day (TID) | ORAL | Status: DC
  Administered 2018-06-05 – 2018-06-08 (×9): 10 mg via ORAL
  Filled 2018-06-05 (×10): qty 1

## 2018-06-05 MED ORDER — FOLIC ACID 1 MG PO TABS
1.0000 mg | ORAL_TABLET | Freq: Every day | ORAL | Status: DC
  Administered 2018-06-05 – 2018-06-08 (×4): 1 mg via ORAL
  Filled 2018-06-05 (×4): qty 1

## 2018-06-05 MED ORDER — SODIUM CHLORIDE 0.9 % IV SOLN
INTRAVENOUS | Status: DC
  Administered 2018-06-05: 09:00:00 via INTRAVENOUS

## 2018-06-05 MED ORDER — AMLODIPINE BESYLATE 5 MG PO TABS
5.0000 mg | ORAL_TABLET | Freq: Every day | ORAL | Status: DC
  Administered 2018-06-05 – 2018-06-08 (×4): 5 mg via ORAL
  Filled 2018-06-05 (×4): qty 1

## 2018-06-05 MED ORDER — ATORVASTATIN CALCIUM 20 MG PO TABS
40.0000 mg | ORAL_TABLET | Freq: Every day | ORAL | Status: DC
  Administered 2018-06-05 – 2018-06-07 (×3): 40 mg via ORAL
  Filled 2018-06-05 (×3): qty 2

## 2018-06-05 MED ORDER — PROPOFOL 500 MG/50ML IV EMUL
INTRAVENOUS | Status: DC | PRN
  Administered 2018-06-05: 100 ug/kg/min via INTRAVENOUS

## 2018-06-05 MED ORDER — LIDOCAINE HCL (PF) 2 % IJ SOLN
INTRAMUSCULAR | Status: AC
  Filled 2018-06-05: qty 10

## 2018-06-05 MED ORDER — POTASSIUM CHLORIDE 10 MEQ/100ML IV SOLN: 10.0000 meq | INTRAVENOUS | Status: DC

## 2018-06-05 MED ORDER — FAT EMULSION PLANT BASED 20 % IV EMUL
250.0000 mL | INTRAVENOUS | Status: AC
  Administered 2018-06-05: 17:00:00 250 mL via INTRAVENOUS
  Filled 2018-06-05: qty 250

## 2018-06-05 MED ORDER — SODIUM CHLORIDE 0.9 % IV SOLN
INTRAVENOUS | Status: DC
  Administered 2018-06-05 – 2018-06-07 (×2): via INTRAVENOUS

## 2018-06-05 MED ORDER — FENTANYL 100 MCG/HR TD PT72
1.0000 | MEDICATED_PATCH | TRANSDERMAL | Status: DC
  Administered 2018-06-05 – 2018-06-08 (×2): 1 via TRANSDERMAL
  Filled 2018-06-05 (×2): qty 1

## 2018-06-05 MED ORDER — VITAMIN B-12 1000 MCG PO TABS
1000.0000 ug | ORAL_TABLET | Freq: Every day | ORAL | Status: DC
  Administered 2018-06-05 – 2018-06-08 (×4): 1000 ug via ORAL
  Filled 2018-06-05 (×4): qty 1

## 2018-06-05 MED ORDER — PROPOFOL 10 MG/ML IV BOLUS
INTRAVENOUS | Status: DC | PRN
  Administered 2018-06-05: 40 mg via INTRAVENOUS

## 2018-06-05 MED ORDER — PROPOFOL 500 MG/50ML IV EMUL
INTRAVENOUS | Status: AC
  Filled 2018-06-05: qty 50

## 2018-06-05 MED ORDER — OXYCODONE-ACETAMINOPHEN 5-325 MG PO TABS
1.0000 | ORAL_TABLET | Freq: Three times a day (TID) | ORAL | Status: DC | PRN
  Administered 2018-06-05 (×2): 1 via ORAL
  Filled 2018-06-05 (×2): qty 1

## 2018-06-05 MED ORDER — TRACE MINERALS CR-CU-MN-SE-ZN 10-1000-500-60 MCG/ML IV SOLN
INTRAVENOUS | Status: AC
  Administered 2018-06-05: 17:00:00 via INTRAVENOUS
  Filled 2018-06-05: qty 1440

## 2018-06-05 MED ORDER — OXYCODONE HCL 5 MG PO TABS
5.0000 mg | ORAL_TABLET | Freq: Three times a day (TID) | ORAL | Status: DC | PRN
  Administered 2018-06-05 (×2): 5 mg via ORAL
  Filled 2018-06-05 (×2): qty 1

## 2018-06-05 NOTE — Anesthesia Postprocedure Evaluation (Signed)
Anesthesia Post Note  Patient: [name]  Procedure(s) Performed: ESOPHAGOGASTRODUODENOSCOPY (EGD) (N/A )  Patient location during evaluation: Endoscopy Anesthesia Type: General Level of consciousness: awake and alert Pain management: pain level controlled Vital Signs Assessment: post-procedure vital signs reviewed and stable Respiratory status: spontaneous breathing, nonlabored ventilation, respiratory function stable and patient connected to nasal cannula oxygen Cardiovascular status: blood pressure returned to baseline and stable Postop Assessment: no apparent nausea or vomiting Anesthetic complications: no     Last Vitals:  Vitals:   06/05/18 1102 06/05/18 1112  BP: (!) 189/72 (!) 195/70  Pulse: (!) 51 (!) 50  Resp: 10 (!) 7  Temp:    SpO2: 96% 96%    Last Pain:  Vitals:   06/05/18 1112  TempSrc:   PainSc: 0-No pain                 Cleda Mccreedy Piscitello

## 2018-06-05 NOTE — Anesthesia Post-op Follow-up Note (Signed)
Anesthesia QCDR form completed.        

## 2018-06-05 NOTE — Progress Notes (Signed)
[employer] - [place] at [facility]   PATIENT NAME: [name]    MR#:  [id number]  DATE OF BIRTH:  [birth year]  SUBJECTIVE:   Patient seen in GI suite.  She is status post dilation of stenosed Billroth II gastrojejunostomy.  REVIEW OF SYSTEMS:    Review of Systems  Constitutional: Negative for fever, chills weight loss HENT: Negative for ear pain, nosebleeds, congestion, facial swelling, rhinorrhea, neck pain, neck stiffness and ear discharge.   Respiratory: Negative for cough, shortness of breath, wheezing  Cardiovascular: Negative for chest pain, palpitations and leg swelling.  Gastrointestinal: Negative for heartburn, abdominal pain, vomiting, diarrhea or consitpation Genitourinary: Negative for dysuria, urgency, frequency, hematuria Musculoskeletal: Negative for back pain or joint pain Neurological: Negative for dizziness, seizures, syncope, focal weakness,  numbness and headaches.  Hematological: Does not bruise/bleed easily.  Psychiatric/Behavioral: Negative for hallucinations, confusion, dysphoric mood    Tolerating Diet:NPO      DRUG ALLERGIES:   Allergies  Allergen Reactions  . Colesevelam Other (See Comments)  . Lovastatin Other (See Comments)  . Tetracyclines & Related Rash    VITALS:  Blood pressure (!) 152/114, pulse 65, temperature 98.4 F (36.9 C), temperature source Oral, resp. rate 15, height 5\' 2"  (1.575 m), weight 48.4 kg, SpO2 97 %.  PHYSICAL EXAMINATION:  Constitutional: Appears thin and frail. No distress. HENT: Normocephalic. . Eyes: Conjunctivae and EOM are normal. PERRLA, no scleral icterus.  Neck: Normal ROM. Neck supple. No JVD. No tracheal deviation. CVS: RRR, S1/S2 +, no murmurs, no gallops, no carotid bruit.  Pulmonary: Effort and breath sounds normal, no stridor, rhonchi, wheezes, rales.  Abdominal: Soft. BS +,  no distension, tenderness, rebound or guarding.  Musculoskeletal: Normal range of motion. No edema and  no tenderness.  Neuro: Alert. CN 2-12 grossly intact. No focal deficits. Skin: Skin is warm and dry. No rash noted. Psychiatric: Normal mood and affect.      LABORATORY PANEL:   CBC Recent Labs  Lab 06/04/18 0244  WBC 7.0  HGB 12.8  HCT 39.2  PLT 121*   ------------------------------------------------------------------------------------------------------------------  Chemistries  Recent Labs  Lab 06/02/18 2146  06/05/18 0442  NA 136   < > 137  K 3.0*   < > 3.4*  CL 89*   < > 102  CO2 36*   < > 28  GLUCOSE 219*   < > 151*  BUN 11   < > 12  CREATININE 0.75   < > 0.56  CALCIUM 9.6   < > 8.3*  MG  --    < > 2.0  AST 20  --   --   ALT 18  --   --   ALKPHOS 65  --   --   BILITOT 0.9  --   --    < > = values in this interval not displayed.   ------------------------------------------------------------------------------------------------------------------  Cardiac Enzymes No results for input(s): TROPONINI in the last 168 hours. ------------------------------------------------------------------------------------------------------------------  RADIOLOGY:  [place] Ekg Site Rite  Result Date: 06/04/2018 If Site Rite image not attached, placement could not be confirmed due to current cardiac rhythm.    ASSESSMENT AND PLAN:    85 year old female with history of dementia and hypertension who presented to the ER due to nausea and vomiting.  1.  Nausea and vomiting with abdominal distention: This is due to stenosis of previous gastrojejunostomy. Patient underwent EGD again this morning and stricture was dilated.   Patient may start clear liquid diet  Continue  TPN for now for nutritional optimization as per surgery.   Okay to restart home medications.  2  Hypokalemia: Replete PRN 3.  Leukocytosis:Reactive in nature  4.  Hyperglycemia with A1c in February of 6.2 5.  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 Patient followed by neurosurgery.  Management plans discussed with the patient and she is in agreement.  CODE STATUS: full  TOTAL TIME TAKING CARE OF THIS PATIENT: 22 minutes.     POSSIBLE D/C ??, DEPENDING ON CLINICAL CONDITION.   Adrian SaranSital Cameran Ahmed M.D on 06/05/2018 at 12:08 PM  Between 7am to 6pm - Pager - [phone] After 6pm go to [email or web address] - password [email or web address]  [facility] [place] [facility]  Office  [phone]  CC: Primary care physician; Default, Provider, MD  Note: This dictation was prepared with Dragon dictation along with smaller phrase technology. Any transcriptional errors that result from this process are unintentional.

## 2018-06-05 NOTE — Progress Notes (Signed)
Patients fentanyl patch has not been in place since her return from EGD. It appears the patch has lost adhesion and is just placed on her chest with no security. Next patch change due tomorrow am at 0900. Per pharmacy we will advance this time to now. Patient also has orders for PRN percocet. Patient does not want to have this medication as a PRN option, she is very cautious about her medications and is afraid someone will given it to her by mistake if it is ordered. Per patient request we will give this medication one time until fentanyl patch is in place and then we will dc it. Notified on call MD of situation.

## 2018-06-05 NOTE — Transfer of Care (Signed)
Immediate Anesthesia Transfer of Care Note  Patient: [name]  Procedure(s) Performed: ESOPHAGOGASTRODUODENOSCOPY (EGD) (N/A )  Patient Location: PACU  Anesthesia Type:General  Level of Consciousness: sedated  Airway & Oxygen Therapy: Patient Spontanous Breathing and Patient connected to nasal cannula oxygen  Post-op Assessment: Report given to RN and Post -op Vital signs reviewed and stable  Post vital signs: Reviewed and stable  Last Vitals:  Vitals Value Taken Time  BP 151/76 06/05/2018 10:42 AM  Temp 36.4 C 06/05/2018 10:42 AM  Pulse 62 06/05/2018 10:42 AM  Resp 13 06/05/2018 10:47 AM  SpO2    Vitals shown include unvalidated device data.  Last Pain:  Vitals:   06/05/18 1042  TempSrc: Tympanic  PainSc: Asleep         Complications: No apparent anesthesia complications

## 2018-06-05 NOTE — Op Note (Signed)
[facility] Gastroenterology Patient Name: [name] Procedure Date: 06/05/2018 10:00 AM MRN: [id number] Account #: [id number] Date of Birth: [birth year] Admit Type: Inpatient Age: [age] [facility] Gender: Female Note Status: Finalized Procedure:            Upper GI endoscopy Indications:          Post-surgical anastomotic stenosis Providers:            Midge Miniumarren Nadra Hritz MD, MD Referring MD:         No Local Md, MD (Referring MD) Medicines:            Propofol per Anesthesia Complications:        No immediate complications. Procedure:            Pre-Anesthesia Assessment:                       - Prior to the procedure, a History and Physical was                        performed, and patient medications and allergies were                        reviewed. The patient's tolerance of previous                        anesthesia was also reviewed. The risks and benefits of                        the procedure and the sedation options and risks were                        discussed with the patient. All questions were                        answered, and informed consent was obtained. Prior                        Anticoagulants: The patient has taken no previous                        anticoagulant or antiplatelet agents. ASA Grade                        Assessment: III - A patient with severe systemic                        disease. After reviewing the risks and benefits, the                        patient was deemed in satisfactory condition to undergo                        the procedure.                       After obtaining informed consent, the endoscope was                        passed under direct vision. Throughout the procedure,  the patient's blood pressure, pulse, and oxygen                        saturations were monitored continuously. The Endoscope                        was introduced through the mouth, and advanced to the                       jejunum. The upper GI endoscopy was accomplished                        without difficulty. The patient tolerated the procedure                        well. Findings:      LA Grade B (one or more mucosal breaks greater than 5 mm, not extending       between the tops of two mucosal folds) esophagitis with no bleeding was       found in the entire esophagus.      Evidence of a stenosed Billroth II gastrojejunostomy was found. This was       traversed. A TTS dilator was passed through the scope. Dilation with a       12-01-10 mm x 5.5 cm CRE balloon and a 12-13.5-15 mm x 5.5 cm CRE       balloon dilator was performed to 15 mm. The dilation site was examined       following endoscope reinsertion and showed moderate mucosal disruption.      The examined jejunum was normal. Impression:           - LA Grade B esophagitis.                       - Stenosed Billroth II gastrojejunostomy was found.                        Dilated.                       - Normal examined jejunum.                       - No specimens collected. Recommendation:       - Return patient to hospital ward for ongoing care.                       - Clear liquid diet.                       - Continue present medications.                       - Repeat upper endoscopy PRN for retreatment. Procedure Code(s):    --- Professional ---                       [phone], Esophagogastroduodenoscopy, flexible, transoral;                        with dilation of gastric/duodenal stricture(s) (eg,  balloon, bougie) Diagnosis Code(s):    --- Professional ---                       K91.89, Other postprocedural complications and                        disorders of digestive system                       Z98.0, Intestinal bypass and anastomosis status                       K20.9, Esophagitis, unspecified CPT copyright 2019 American Medical Association. All rights reserved. The codes documented in this  report are preliminary and upon coder review may  be revised to meet current compliance requirements. Midge Minium MD, MD 06/05/2018 10:38:39 AM This report has been signed electronically. Number of Addenda: 0 Note Initiated On: 06/05/2018 10:00 AM      [facility]

## 2018-06-05 NOTE — Anesthesia Preprocedure Evaluation (Addendum)
Anesthesia Evaluation  Patient identified by MRN, date of birth, ID band Patient awake    Reviewed: Allergy & Precautions, H&P , NPO status , Patient's Chart, lab work & pertinent test results  History of Anesthesia Complications Negative for: history of anesthetic complications  Airway Mallampati: III  TM Distance: <3 FB Neck ROM: limited    Dental  (+) Poor Dentition, Missing, Edentulous Upper, Edentulous Lower   Pulmonary neg pulmonary ROS, neg shortness of breath,           Cardiovascular Exercise Tolerance: Good hypertension, (-) angina(-) Past MI and (-) DOE      Neuro/Psych negative neurological ROS  negative psych ROS   GI/Hepatic Neg liver ROS, GERD  Medicated and Controlled,  Endo/Other  negative endocrine ROS  Renal/GU negative Renal ROS  negative genitourinary   Musculoskeletal   Abdominal   Peds  Hematology negative hematology ROS (+)   Anesthesia Other Findings Patient is NPO appropriate and reports no nausea or vomiting today.  Past Medical History: No date: GERD (gastroesophageal reflux disease) No date: High cholesterol No date: Hypertension  Past Surgical History: No date: ABDOMINAL SURGERY No date: BACK SURGERY No date: CHOLECYSTECTOMY No date: REPLACEMENT TOTAL KNEE; Right No date: TOE AMPUTATION; Left     Comment:  great toe No date: TONSILLECTOMY  BMI    Body Mass Index:  19.52 kg/m      Reproductive/Obstetrics negative OB ROS                             Anesthesia Physical  Anesthesia Plan  ASA: III  Anesthesia Plan: General   Post-op Pain Management:    Induction: Intravenous  PONV Risk Score and Plan: Propofol infusion and TIVA  Airway Management Planned: Natural Airway and Nasal Cannula  Additional Equipment:   Intra-op Plan:   Post-operative Plan:   Informed Consent: I have reviewed the patients History and Physical, chart, labs  and discussed the procedure including the risks, benefits and alternatives for the proposed anesthesia with the patient or authorized representative who has indicated his/her understanding and acceptance.     Dental Advisory Given  Plan Discussed with: Anesthesiologist, CRNA and Surgeon  Anesthesia Plan Comments: (Phone consent from sister Dewaine Conger at [phone], she said that the patients husband, her father is very hard of hearing and would have trouble comunicating over the phone      Sister consented for risks of anesthesia including but not limited to:  - adverse reactions to medications - risk of intubation if required - damage to teeth, lips or other oral mucosa - sore throat or hoarseness - Damage to heart, brain, lungs or loss of life  She voiced understanding.)       Anesthesia Quick Evaluation

## 2018-06-05 NOTE — Evaluation (Signed)
Physical Therapy Evaluation Patient Details Name: MAECIE KILZER MRN: [id number] DOB: Nov 27, 1932 Today's Date: 06/05/2018   History of Present Illness  Jacquelyn Kestel is an 85yo female comes to [facility] on 4/13 after 10-12d of N/V, dry heaving. Pt admitting with assumed ileus. Pt now s/p EGD. 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 PTA pt AMB limited community distances with [phone]  or shopping buggy.   Clinical Impression  Pt admitted with above diagnosis. Pt currently with functional limitations due to the deficits listed below (see "PT Problem List"). Upon entry, pt in bed. The pt is awake and agreeable to participate. The pt is alert and oriented x3, pleasant, conversational, and following commands consistently. Extra effort to perform independent bed mobility and transfers. Pt AMB 17ft with RW, slower than typical pace (per patient) and distance limited by more than 50% of typical tolerance (per patient.) Pt reports feeling 'a fair bit' weaker than her baseline level. Functional mobility assessment demonstrates increased effort/time requirements, poor tolerance, and need for physical assistance, whereas the patient performed these at a higher level of independence PTA. Pt will benefit from skilled PT intervention to increase independence and safety with basic mobility in preparation for discharge to the venue listed below.       Follow Up Recommendations Home health PT    Equipment Recommendations  None recommended by PT    Recommendations for Other Services       Precautions / Restrictions Precautions Precautions: Fall Restrictions Weight Bearing Restrictions: No      Mobility  Bed Mobility Overal bed mobility: Modified Independent             General bed mobility comments: additional effort  Transfers Overall transfer level: Modified independent Equipment used: Rolling walker (2 wheeled)              General transfer comment: additional effort  Ambulation/Gait Ambulation/Gait assistance: Min guard Gait Distance (Feet): 190 Feet Assistive device: Rolling walker (2 wheeled) Gait Pattern/deviations: Trunk flexed;WFL(Within Functional Limits) Gait velocity: 0.83m/s  Gait velocity interpretation: <1.31 ft/sec, indicative of household ambulator General Gait Details: reports to feel tired after 5ft, concerned to return to room at 115ft.   Stairs            Wheelchair Mobility    Modified Rankin (Stroke Patients Only)       Balance Overall balance assessment: Modified Independent(no recent falls history; pt much a fair amount weaker than PTA)                                           Pertinent Vitals/Pain Pain Assessment: No/denies pain    Home Living Family/patient expects to be discharged to:: Private residence Living Arrangements: Spouse/significant other Available Help at Discharge: Family Type of Home: House Home Access: Ramped entrance     Home Layout: One level Home Equipment: Environmental consultant - 4 wheels;Cane - single point;Shower seat      Prior Function Level of Independence: Independent with assistive device(s)         Comments: Was driving PTA, DTR asssits with groceries. Sister helps with driving too; long distance AMB limited by gait instability.      Hand Dominance        Extremity/Trunk Assessment   Upper Extremity Assessment Upper Extremity Assessment: Generalized weakness;Overall [facility] for tasks  assessed    Lower Extremity Assessment Lower Extremity Assessment: Generalized weakness;[facility] tasks assessed    Cervical / Trunk Assessment Cervical / Trunk Assessment: Kyphotic(hardware failure at L2/3)  Communication   Communication: No difficulties  Cognition Arousal/Alertness: Awake/alert Behavior During Therapy: WFL for tasks assessed/performed Overall Cognitive Status: Within Functional Limits for  tasks assessed                                        General Comments      Exercises     Assessment/Plan    PT Assessment Patient needs continued PT services  PT Problem List Decreased strength;Decreased activity tolerance;Decreased mobility       PT Treatment Interventions Therapeutic exercise;Gait training;Functional mobility training;Therapeutic activities;Patient/family education    PT Goals (Current goals can be found in the Care Plan section)  Acute Rehab PT Goals Patient Stated Goal: regain strength and acitivty tolerance in AMB  PT Goal Formulation: With patient Time For Goal Achievement: 06/19/18 Potential to Achieve Goals: Good    Frequency Min 2X/week   Barriers to discharge Decreased caregiver support Husband has CA and limited capacity to assist patient     Co-evaluation               AM-PAC PT "6 Clicks" Mobility  Outcome Measure Help needed turning from your back to your side while in a flat bed without using bedrails?: None Help needed moving from lying on your back to sitting on the side of a flat bed without using bedrails?: None Help needed moving to and from a bed to a chair (including a wheelchair)?: A Little Help needed standing up from a chair using your arms (e.g., wheelchair or bedside chair)?: A Little Help needed to walk in hospital room?: A Little Help needed climbing 3-5 steps with a railing? : A Little 6 Click Score: 20    End of Session Equipment Utilized During Treatment: Gait belt Activity Tolerance: Patient limited by fatigue;Patient tolerated treatment well Patient left: in bed;with bed alarm set;with call bell/phone within reach Nurse Communication: Mobility status PT Visit Diagnosis: Other abnormalities of gait and mobility (R26.89);Muscle weakness (generalized) (M62.81);Difficulty in walking, not elsewhere classified (R26.2)    Time: 1420-1440 PT Time Calculation (min) (ACUTE ONLY): 20 min   Charges:    PT Evaluation $PT Eval Moderate Complexity: 1 Mod PT Treatments $Therapeutic Exercise: 8-22 mins       3:28 PM, 06/05/18 Rosamaria LintsAllan C Breckin Savannah, PT, DPT Physical Therapist - [facility]  [phone] (ASCOM)     Narayan Scull C 06/05/2018, 3:26 PM

## 2018-06-05 NOTE — Progress Notes (Addendum)
ADDENDUM 06/05/18 10:38 AM  - Discussed patient with Dr Servando Snare, MD and Dr Earlene Plater, MD in endoscopy, appears patient had Billroth II gastrojejunostomy. This was stenosed but was able to dilate (refer to Dr Annabell Sabal note for full details of procedure) - Will leave NGT for a few hours for decompression, but may consider removing this afternoon and starting on clears.  - Moving forward, will continue to monitor and may repeat EGD and dilation in a few days vs possible surgical intervention to include gastrostomy and feeding jejunostomy.  - Continue BID PPI - Continue current management otherwise as outlined below    [facility] SUR[facility]ICAL PRO[facility]ESS NOTE (cpt 99231)  Hospital Day(s): 3.   Post op day(s): Day of Surgery.   Interval History: Patient seen and examined in endoscopy holding area, no acute events or new complaints overnight. Patient reports that she is feeling pretty good this morning. She denied any abdominal pain, nausea, emesis, fever, or chills. EGD yesterday was concerning for stricture vs stenosis of previous gastrojejunostomy anastomosis. Plane for repeat EGD today and possible balloon dilation. She was started on TPN yesterday. Continued NGT decompression with 450 out yesterday. No other complaints.    Review of Systems:  Constitutional: denies fever, chills  Respiratory: denies any shortness of breath  Cardiovascular: denies chest pain or palpitations  Gastrointestinal: denies abdominal pain, N/V, or diarrhea/and bowel function as per interval history Genitourinary: denies burning with urination or urinary frequency   Vital signs in last 24 hours: [min-max] current  Temp:  [97.2 F (36.2 C)-98.5 F (36.9 C)] 97.2 F (36.2 C) (04/15 0856) Pulse Rate:  [52-71] 61 (04/15 0856) Resp:  [18-20] 20 (04/15 0856) BP: (136-156)/(54-80) 156/80 (04/15 0856) SpO2:  [99 %-100 %] 100 % (04/15 0856) Weight:  [48.4 kg] 48.4 kg (04/15 0856)     Height: 5\' 2"  (157.5 cm)  Weight: 48.4 kg BMI (Calculated): 19.51   Intake/Output this shift:  Total I/O In: 482.5 [I.V.:462.5; NG/GT:20] Out: -      Physical Exam:  Constitutional: alert, cooperative and no distress  HENT: normocephalic without obvious abnormality, NGT in place  Eyes: PERRL, EOM's grossly intact and symmetric  Respiratory: breathing non-labored at rest  Gastrointestinal: soft, non-tender, and non-distended, no rebound/guarding.  Integumentary: Previous healed abdominal surgical incisions including upper midline laparotomy incision Musculoskeletal: no edema or wounds, motor and sensation grossly intact, NT    Labs:  CBC Latest Ref Rng & Units 06/04/2018 06/03/2018 06/02/2018  WBC 4.0 - 10.5 K/uL 7.0 9.1 16.2(H)  Hemoglobin 12.0 - 15.0 g/dL 92.4 46.2 16.2(H)  Hematocrit 36.0 - 46.0 % 39.2 42.6 48.2(H)  Platelets 150 - 400 K/uL 121(L) 145(L) 176   CMP Latest Ref Rng & Units 06/05/2018 06/04/2018 06/03/2018  Glucose 70 - 99 mg/dL 863(O) 94 177(N)  BUN 8 - 23 mg/dL 12 9 10   Creatinine 0.44 - 1.00 mg/dL 1.65 7.90 3.83  Sodium 135 - 145 mmol/L 137 139 138  Potassium 3.5 - 5.1 mmol/L 3.4(L) 4.6 3.1(L)  Chloride 98 - 111 mmol/L 102 104 95(L)  CO2 22 - 32 mmol/L 28 28 35(H)  Calcium 8.9 - 10.3 mg/dL 8.3(L) 8.5(L) 8.8(L)  Total Protein 6.5 - 8.1 g/dL - - -  Total Bilirubin 0.3 - 1.2 mg/dL - - -  Alkaline Phos 38 - 126 U/L - - -  AST 15 - 41 U/L - - -  ALT 0 - 44 U/L - - -     Imaging studies: No new pertinent imaging studies  Assessment/Plan: (ICD-10's: 68K56.7) 85 y.o. female with mild hypokalemia and gastrojejunal anastomotic stricture and resolved hypokalemia s/p gastrojejunal bypass (2011) for what appears to have been gastric outlet obstruction,complicated by pertinent comorbidities including advanced chronological age and associated frailty,HTN, HLD, and mild dementia   - Remain NPO, continue NGT Decompression; TPN (appreciate pharmacy/dietitian help)  - Antiemetics prn; pain control  prn  - Monitor abdominal pain; on-going bowel function; NGT decompression   - Replete K+; monitor  - Appreciate GI involvement; plan for repeat EGD today with possible dilation  - will continue to attempt to review records from 2011 operation; discuss potential for surgical management   - Appreciate medicine involvement; risk-stratify for potential surgical intervention  All of the above findings and recommendations were discussed with the patient, and the medical team, and all of patient's questions were answered to her expressed satisfaction.  -- Lynden OxfordZachary Soniyah Mcglory, PA-C [facility] 06/05/2018, 10:38 AM [phone] M-F: 7am - 4pm

## 2018-06-05 NOTE — Consult Note (Signed)
PHARMACY - ADULT TOTAL PARENTERAL NUTRITION CONSULT NOTE   Pharmacy Consult for TPN Indication: Bowel Obstruction  Patient Measurements: Height: 5\' 2"  (157.5 cm) Weight: 106 lb 11.2 oz (48.4 kg) IBW/kg (Calculated) : 50.1 TPN AdjBW (KG): 48.4 Body mass index is 19.52 kg/m. Usual Weight: ~55kg  Assessment:  85 y.o. female with a known history of mild dementia, Gerd, hypertension, high cholesterol, GOO s/p gastrojejunostomy in 2011 admitted with 2 week h/o nausea and vomiting. Pt noted to have adynamic ileus   Pharmacy has been consulted to start TPN by surgery per dietician consult.    TPN Access: PICC line (being placed today 4/14)  TPN start date: 4/14  Nutritional Goals (per RD recommendation on 4/14):  KCal: 476-5465 kcal/day Protein: 48-72 g/day Fluid: 960-1452ml/day (1140-1657ml/day with Fat emulsion)   Starting rate 21ml/hr (provides 956ml/day) Goal TPN rate is 60 ml/hr (provides 1449ml/day)  Current Nutrition: NPO   Today's Plan:  Clinimix 5/15 with electrolytes TPN  at 60 mL/hr (goal rate) x 24 hours starting 4/15 @ 1800  Fat Emulsion 20% at 41ml/hr x 12 hours per day  Baseline Electrolytes  K=4.6, Phos=3.4, Mg=2.3  Today's Electrolytes   K=3.4, Phos=2.8, Mg=2.0  Will give KCl IV runs x 4, no other electrolyte replenishment at this time  Electrolytes in TPN: None added  Add MVI, trace elements, and thiamine 100mg  (1st 3 days only - today day #2) to TPN  NS IVMF (D5, NS, etc.) now at 25 ml/hr (for total fluid/hour)  Monitor TPN labs - K, Phos, Mg daily for 3 days (high re-feed risk), then per protocol   F/U with am labs  Albina Billet, PharmD, BCPS Clinical Pharmacist 06/05/2018 9:41 AM

## 2018-06-05 NOTE — Plan of Care (Signed)
  Problem: Activity: Goal: Risk for activity intolerance will decrease Outcome: Progressing   Problem: Nutrition: Goal: Adequate nutrition will be maintained Outcome: Progressing  Patient tolerating clear liquids post EGD   Problem: Pain Managment: Goal: General experience of comfort will improve Outcome: Progressing

## 2018-06-06 ENCOUNTER — Encounter: Payer: Self-pay | Admitting: Gastroenterology

## 2018-06-06 LAB — BASIC METABOLIC PANEL
Anion gap: 6 (ref 5–15)
BUN: 14 mg/dL (ref 8–23)
CO2: 26 mmol/L (ref 22–32)
Calcium: 8.3 mg/dL — ABNORMAL LOW (ref 8.9–10.3)
Chloride: 106 mmol/L (ref 98–111)
Creatinine, Ser: 0.55 mg/dL (ref 0.44–1.00)
GFR calc Af Amer: >60 mL/min (ref >60)
GFR calc non Af Amer: >60 mL/min (ref >60)
Glucose, Bld: 116 mg/dL — ABNORMAL HIGH (ref 70–99)
Potassium: 3.6 mmol/L (ref 3.5–5.1)
Sodium: 138 mmol/L (ref 135–145)

## 2018-06-06 LAB — GLUCOSE, CAPILLARY
Glucose-Capillary: 122 mg/dL — ABNORMAL HIGH (ref 70–99)
Glucose-Capillary: 105 mg/dL — ABNORMAL HIGH (ref 70–99)
Glucose-Capillary: 113 mg/dL — ABNORMAL HIGH (ref 70–99)
Glucose-Capillary: 92 mg/dL (ref 70–99)

## 2018-06-06 LAB — MAGNESIUM: Magnesium: 1.8 mg/dL (ref 1.7–2.4)

## 2018-06-06 LAB — PHOSPHORUS: Phosphorus: 3.2 mg/dL (ref 2.5–4.6)

## 2018-06-06 MED ORDER — TRACE MINERALS CR-CU-MN-SE-ZN 10-1000-500-60 MCG/ML IV SOLN
INTRAVENOUS | Status: AC
  Administered 2018-06-06: 18:00:00 via INTRAVENOUS
  Filled 2018-06-06: qty 1440

## 2018-06-06 MED ORDER — ENSURE ENLIVE PO LIQD
237.0000 mL | Freq: Two times a day (BID) | ORAL | Status: DC
  Administered 2018-06-06 – 2018-06-08 (×4): 237 mL via ORAL

## 2018-06-06 MED ORDER — PANTOPRAZOLE SODIUM 40 MG PO TBEC
40.0000 mg | DELAYED_RELEASE_TABLET | Freq: Two times a day (BID) | ORAL | Status: DC
  Administered 2018-06-06 – 2018-06-08 (×4): 40 mg via ORAL
  Filled 2018-06-06 (×4): qty 1

## 2018-06-06 MED ORDER — FAT EMULSION PLANT BASED 20 % IV EMUL
250.0000 mL | INTRAVENOUS | Status: AC
  Administered 2018-06-06: 250 mL via INTRAVENOUS
  Filled 2018-06-06: qty 250

## 2018-06-06 MED ORDER — POTASSIUM CHLORIDE 10 MEQ/100ML IV SOLN
10.0000 meq | INTRAVENOUS | Status: AC
  Administered 2018-06-06 (×4): 10 meq via INTRAVENOUS
  Filled 2018-06-06 (×3): qty 100

## 2018-06-06 NOTE — TOC Progression Note (Signed)
Transition of Care [facility]) - Progression Note    Patient Details  Name: [name] MRN: [id number] Date of Birth: July 30, 1932  Transition of [facility]) CM/SW Contact  Chapman Fitch, RN Phone Number: 06/06/2018, 2:44 PM  Clinical Narrative:    RNCM called patient's daughter to discuss discharge disposition.  At that time she was about to video chat with the patient and requested that I called back.  RNCM attempted to call back at later time and daughter was unavailable.  Voicemail was left.   PT has assessed patient and recommends home health PT.  Patient agreeable and states since she has used [facility] in the past she would just like to use them again. CMS Medicare.gov Compare Post Acute Care list reviewed with patient.  Referral made to [facility] with [facility]    Expected Discharge Plan: Home w Home Health Services Barriers to Discharge: Continued Medical Work up  Expected Discharge Plan and Services Expected Discharge Plan: Home w Home Health Services       Living arrangements for the past 2 months: Single Family Home                           Social Determinants of Health (SDOH) Interventions    Readmission Risk Interventions No flowsheet data found.

## 2018-06-06 NOTE — Progress Notes (Signed)
[place] [facility] SUR[facility]ICAL PROGRESS NOTE (cpt 9923*1)  Hospital Day(s): 4.   Post op day(s): 1 Day Post-Op.   Interval History: Patient seen and examined, no acute events or new complaints overnight. Patient reports that she is feeling much better this morning. Underwent EGD with balloon dilation yesterday with Dr Servando SnareWohl, MD. NGT was removed yesterday without issue and she was started on CLD. No complaints of fever, chills, nausea, emesis, or abdominal pain. She iis uncertain is she is passing flatus but believes she is having very small BMs with urination. Tolerating clear liquids without issue. No other complaints.   Review of Systems:  Constitutional: denies fever, chills  Respiratory: denies any shortness of breath  Cardiovascular: denies chest pain or palpitations  Gastrointestinal: denies abdominal pain, N/V, or diarrhea/and bowel function as per interval history Genitourinary: denies burning with urination or urinary frequency   Vital signs in last 24 hours: [min-max] current  Temp:  [98 F (36.7 C)-99.2 F (37.3 C)] 98 F (36.7 C) (04/16 0436) Pulse Rate:  [50-80] 63 (04/16 0607) Resp:  [7-17] 17 (04/16 0436) BP: (98-195)/(53-114) 134/67 (04/16 0607) SpO2:  [96 %-99 %] 99 % (04/16 0436) Weight:  [48 kg] 48 kg (04/16 0500)     Height: 5\' 2"  (157.5 cm) Weight: 48 kg BMI (Calculated): 19.35   Intake/Output this shift:  Total I/O In: 669.3 [P.O.:240; I.V.:429.3] Out: -     Physical Exam:  Constitutional: alert, cooperative and no distress  HENT: normocephalic without obvious abnormality  Eyes: PERRL, EOM's grossly intact and symmetric  Respiratory: breathing non-labored at rest  Gastrointestinal: soft, non-tender, and non-distended, no rebound/guarding Musculoskeletal: no edema or wounds, motor and sensation grossly intact, NT    Labs:  CBC Latest Ref Rng & Units 06/04/2018 06/03/2018 06/02/2018  WBC 4.0 - 10.5 K/uL 7.0 9.1 16.2(H)  Hemoglobin 12.0 - 15.0 g/dL  45.412.8 09.814.4 16.2(H)  Hematocrit 36.0 - 46.0 % 39.2 42.6 48.2(H)  Platelets 150 - 400 K/uL 121(L) 145(L) 176   CMP Latest Ref Rng & Units 06/06/2018 06/05/2018 06/04/2018  Glucose 70 - 99 mg/dL 119(J116(H) 478(G151(H) 94  BUN 8 - 23 mg/dL 14 12 9   Creatinine 0.44 - 1.00 mg/dL 9.560.55 2.130.56 0.860.68  Sodium 135 - 145 mmol/L 138 137 139  Potassium 3.5 - 5.1 mmol/L 3.6 3.4(L) 4.6  Chloride 98 - 111 mmol/L 106 102 104  CO2 22 - 32 mmol/L 26 28 28   Calcium 8.9 - 10.3 mg/dL 8.3(L) 8.3(L) 8.5(L)  Total Protein 6.5 - 8.1 g/dL - - -  Total Bilirubin 0.3 - 1.2 mg/dL - - -  Alkaline Phos 38 - 126 U/L - - -  AST 15 - 41 U/L - - -  ALT 0 - 44 U/L - - -     Imaging studies: No new pertinent imaging studies   Assessment/Plan: (ICD-10's: 63K56.669) 85 y.o. female who appears to be clinically much improved 1 Day Post-Op s/p EGD with dilation for stenosis of previous Billroth II anastomosis for gastric outlet obstruction in 2011, complicated by pertinent comorbidities including advanced chronological age and associated frailty,HTN, HLD, and mild dementia.   - Will advance to full liquids  - Continue TPN until certain she is tolerating a diet without issue  - Closely monitor for recurrence of abdominal pain/distension, nausea, emesis. Patient is aware to alert RN staff.   - Monitor abdominal examination; on-going bowel function  - May need repeat EGD in a few days; appreciate GI involvement  - If she clinically  worsens or fails dilation, then she understands she may need surgical intervention including possible gastrojejunostomy vs gastrostomy and feeding jejunostomy  - Medical management of comorbidities per primary    All of the above findings and recommendations were discussed with the patient, and the medical team, and all of patient's questions were answered to hier expressed satisfaction.  -- Lynden Oxford, PA-C [facility] 06/06/2018, 11:00 AM [phone] M-F: 7am - 4pm

## 2018-06-06 NOTE — Progress Notes (Signed)
[facility] [place] [facility]   PATIENT NAME: [name]    MR#:  [id number]  DATE OF BIRTH:  May 22, 1932  SUBJECTIVE:   Patient tolerating clear liquid diet.  Denies abdominal pain or nausea.  REVIEW OF SYSTEMS:    Review of Systems  Constitutional: Negative for fever, chills weight loss HENT: Negative for ear pain, nosebleeds, congestion, facial swelling, rhinorrhea, neck pain, neck stiffness and ear discharge.   Respiratory: Negative for cough, shortness of breath, wheezing  Cardiovascular: Negative for chest pain, palpitations and leg swelling.  Gastrointestinal: Negative for heartburn, abdominal pain, vomiting, diarrhea or consitpation Genitourinary: Negative for dysuria, urgency, frequency, hematuria Musculoskeletal: Negative for back pain or joint pain Neurological: Negative for dizziness, seizures, syncope, focal weakness,  numbness and headaches.  Hematological: Does not bruise/bleed easily.  Psychiatric/Behavioral: Negative for hallucinations, confusion, dysphoric mood    Tolerating Diet: yes     DRUG ALLERGIES:   Allergies  Allergen Reactions  . Colesevelam Other (See Comments)  . Lovastatin Other (See Comments)  . Tetracyclines & Related Rash    VITALS:  Blood pressure 134/67, pulse 63, temperature 98 F (36.7 C), temperature source Oral, resp. rate 17, height 5\' 2"  (1.575 m), weight 48 kg, SpO2 99 %.  PHYSICAL EXAMINATION:  Constitutional: Appears thin and frail. No distress. HENT: Normocephalic. . Eyes: Conjunctivae and EOM are normal. PERRLA, no scleral icterus.  Neck: Normal ROM. Neck supple. No JVD. No tracheal deviation. CVS: RRR, S1/S2 +, no murmurs, no gallops, no carotid bruit.  Pulmonary: Effort and breath sounds normal, no stridor, rhonchi, wheezes, rales.  Abdominal: Soft. BS +,  no distension, tenderness, rebound or guarding.  Musculoskeletal: Normal range of motion. No edema and no tenderness.  Neuro: Alert. CN  2-12 grossly intact. No focal deficits. Skin: Skin is warm and dry. No rash noted. Psychiatric: Normal mood and affect.      LABORATORY PANEL:   CBC Recent Labs  Lab 06/04/18 0244  WBC 7.0  HGB 12.8  HCT 39.2  PLT 121*   ------------------------------------------------------------------------------------------------------------------  Chemistries  Recent Labs  Lab 06/02/18 2146  06/06/18 0555  NA 136   < > 138  K 3.0*   < > 3.6  CL 89*   < > 106  CO2 36*   < > 26  GLUCOSE 219*   < > 116*  BUN 11   < > 14  CREATININE 0.75   < > 0.55  CALCIUM 9.6   < > 8.3*  MG  --    < > 1.8  AST 20  --   --   ALT 18  --   --   ALKPHOS 65  --   --   BILITOT 0.9  --   --    < > = values in this interval not displayed.   ------------------------------------------------------------------------------------------------------------------  Cardiac Enzymes No results for input(s): TROPONINI in the last 168 hours. ------------------------------------------------------------------------------------------------------------------  RADIOLOGY:  [place] Ekg Site Rite  Result Date: 06/04/2018 If Site Rite image not attached, placement could not be confirmed due to current cardiac rhythm.    ASSESSMENT AND PLAN:    85 year old female with history of dementia and hypertension who presented to the ER due to nausea and vomiting.  1.  Nausea and vomiting with abdominal distention: This is due to stenosis of previous gastrojejunostomy. Patient underwent EGD and stricture was dilated.   Continue on clear liquid diet through today and if tolerated then surgery will advance diet.  She  should continue TPN until she is able to take enough nutrition orally. If patient symptoms worsen she will need re-dilation of stricture.   2  Hypokalemia: Replete PRN 3.  Leukocytosis:Reactive in nature  4.  Hyperglycemia with A1c in February of 6.2 5.  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 Patient followed by patient neurosurgery.  Management plans discussed with the patient and she is in agreement.  CODE STATUS: full  TOTAL TIME TAKING CARE OF THIS PATIENT: 22 minutes.     POSSIBLE D/C ??, DEPENDING ON CLINICAL CONDITION.   Adrian SaranSital Meiling Hendriks M.D on 06/06/2018 at 11:00 AM  Between 7am to 6pm - Pager - [phone] After 6pm go to [email or web address] - password [email or web address]  [facility]  Office  [phone]  CC: Primary care physician; Default, Provider, MD  Note: This dictation was prepared with Dragon dictation along with smaller phrase technology. Any transcriptional errors that result from this process are unintentional.

## 2018-06-06 NOTE — Care Management Important Message (Signed)
Important Message  Patient Details  Name: [name] MRN: [id number] Date of Birth: Mar 21, 1932   Medicare Important Message Given:  Yes    Johnell Comings 06/06/2018, 12:35 PM

## 2018-06-06 NOTE — Progress Notes (Signed)
   Melodie Bouillon, MD [address], [place], [place], [address] [address], [address], [place], [place], [address] Phone: [phone]  Fax: [phone]   Subjective: Pt feeling well today. Tolerating oral diet without difficulty. No further N/V. Had a BM today  Objective: Exam: Vital signs in last 24 hours: Vitals:   06/06/18 0500 06/06/18 0607 06/06/18 1136 06/06/18 1338  BP:  134/67 107/64   Pulse:  63 67 72  Resp:   17   Temp:   98.2 F (36.8 C)   TempSrc:   Oral   SpO2:   98% 98%  Weight: 48 kg     Height:       Weight change:   Intake/Output Summary (Last 24 hours) at 06/06/2018 1903 Last data filed at 06/06/2018 1700 Gross per 24 hour  Intake 2916.82 ml  Output 2100 ml  Net 816.82 ml    General: No acute distress, AAO x3 Abd: Soft, NT/ND, No HSM Skin: Warm, no rashes Neck: Supple, Trachea midline   Lab Results: Lab Results  Component Value Date   WBC 7.0 06/04/2018   HGB 12.8 06/04/2018   HCT 39.2 06/04/2018   MCV 93.8 06/04/2018   PLT 121 (L) 06/04/2018   Micro Results: No results found for this or any previous visit (from the past 240 hour(s)). Studies/Results: No results found. Medications:  Scheduled Meds: . amLODipine  5 mg Oral Daily  . atorvastatin  40 mg Oral q1800  . dicyclomine  10 mg Oral TID AC  . enoxaparin (LOVENOX) injection  40 mg Subcutaneous Q24H  . feeding supplement (ENSURE ENLIVE)  237 mL Oral BID BM  . fentaNYL  1 patch Transdermal Q72H  . folic acid  1 mg Oral Daily  . pantoprazole  40 mg Oral BID AC  . sodium chloride flush  10-40 mL Intracatheter Q12H  . sodium chloride flush  3 mL Intravenous Once  . vitamin B-12  1,000 mcg Oral Daily   Continuous Infusions: . Marland KitchenTPN (CLINIMIX-E) Adult 60 mL/hr at 06/06/18 1802  . sodium chloride Stopped (06/06/18 1445)  . Fat emulsion 250 mL (06/06/18 1806)   PRN Meds:.acetaminophen **OR** acetaminophen, ondansetron **OR** ondansetron (ZOFRAN) IV, sodium chloride  flush   Assessment: Active Problems:   Ileus (HCC)   Gastric outlet obstruction   Stricture of bowel (HCC)    Plan: Pt symptomatically improved post dilation of the anastomosis by Dr. Servando Snare.  Dr. Aleen Campi discussed need for repeat dilation in few weeks with Dr. Servando Snare. Stent placement at the site may also be an option in the future if repeat dilations are ineffective.   Continue oral diet as tolerated  Pt will need outpt GI appt within 1-2 weeks of discharge.  GI service will sign off at this time Please page with any questions    LOS: 4 days   Melodie Bouillon, MD 06/06/2018, 7:03 PM

## 2018-06-06 NOTE — Progress Notes (Signed)
Physical Therapy Treatment Patient Details Name: [name] MRN: [id number] DOB: 05-20-1932 Today's Date: 06/06/2018    History of Present Illness [name] is an 85yo female [facility] on 4/13 after 10-12d of N/V, dry heaving. Pt admitting with assumed ileus. Pt now s/p EGD. 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 PTA pt AMB limited community distances with [phone]  or shopping buggy.     PT Comments    Pt agreeable to PT; denies pain. Pt demonstrates bed mobility with mild increased time/use of bed rail Mod I.  STS with supervision and cues for proper hand placement versus pulling from rolling walker. Stand with forward flexed trunk (baseline). Ambulates this session 190 ft at rate of 1.4 ft/sec, which is much improved; pt notes feeling much improved with ambulation this date. Pt is fatigued post ambulation and wishes to return to bed. Continue PT to progress strength and endurance to improve all functional mobility and safety to allow for an optimal return home.    Follow Up Recommendations  Home health PT     Equipment Recommendations  None recommended by PT    Recommendations for Other Services       Precautions / Restrictions Precautions Precautions: Fall Restrictions Weight Bearing Restrictions: No    Mobility  Bed Mobility Overal bed mobility: Modified Independent             General bed mobility comments: Mild increased time; use of rail  Transfers Overall transfer level: Needs assistance Equipment used: Rolling walker (2 wheeled) Transfers: Sit to/from Stand Sit to Stand: Supervision         General transfer comment: cues for proper hand placement to stand; pt pulls from rw  Ambulation/Gait Ambulation/Gait assistance: Min guard Gait Distance (Feet): 190 Feet Assistive device: Rolling walker (2 wheeled) Gait Pattern/deviations: Trunk flexed;Step-through  pattern   Gait velocity interpretation: <1.8 ft/sec, indicate of risk for recurrent falls(1.4 ft/sec) General Gait Details: Denies complaints of fatigue during ambulation (monitored several times).   Stairs             Wheelchair Mobility    Modified Rankin (Stroke Patients Only)       Balance Overall balance assessment: Needs assistance Sitting-balance support: Bilateral upper extremity supported;Feet supported Sitting balance-Leahy Scale: Good     Standing balance support: Bilateral upper extremity supported Standing balance-Leahy Scale: Fair                              Cognition Arousal/Alertness: Awake/alert Behavior During Therapy: WFL for tasks assessed/performed Overall Cognitive Status: Within Functional Limits for tasks assessed                                        Exercises General Exercises - Lower Extremity Ankle Circles/Pumps: AROM;Both;10 reps Quad Sets: Strengthening;Both;10 reps Gluteal Sets: Strengthening;Both;10 reps Long Arc Quad: AROM;Both;10 reps Hip Flexion/Marching: AROM;Both;10 reps    General Comments        Pertinent Vitals/Pain Pain Assessment: No/denies pain    Home Living                      Prior Function            PT Goals (current goals can now be found in the  care plan section) Progress towards PT goals: Progressing toward goals    Frequency    Min 2X/week      PT Plan Current plan remains appropriate    Co-evaluation              AM-PAC PT "6 Clicks" Mobility   Outcome Measure  Help needed turning from your back to your side while in a flat bed without using bedrails?: A Little Help needed moving from lying on your back to sitting on the side of a flat bed without using bedrails?: A Little Help needed moving to and from a bed to a chair (including a wheelchair)?: A Little Help needed standing up from a chair using your arms (e.g., wheelchair or bedside  chair)?: A Little Help needed to walk in hospital room?: A Little Help needed climbing 3-5 steps with a railing? : A Little 6 Click Score: 18    End of Session Equipment Utilized During Treatment: Gait belt Activity Tolerance: Patient tolerated treatment well Patient left: in bed;with call bell/phone within reach;with bed alarm set   PT Visit Diagnosis: Other abnormalities of gait and mobility (R26.89);Muscle weakness (generalized) (M62.81);Difficulty in walking, not elsewhere classified (R26.2)     Time: [id number] PT Time Calculation (min) (ACUTE ONLY): 28 min  Charges:  $Gait Training: 8-22 mins $Therapeutic Exercise: 8-22 mins                      Scot DockHeidi E Liliyana Thobe, PTA 06/06/2018, 2:17 PM

## 2018-06-06 NOTE — Consult Note (Addendum)
PHARMACY - ADULT TOTAL PARENTERAL NUTRITION CONSULT NOTE   Pharmacy Consult for TPN Indication: Bowel Obstruction  Patient Measurements: Height: 5\' 2"  (157.5 cm) Weight: 105 lb 13.1 oz (48 kg) IBW/kg (Calculated) : 50.1 TPN AdjBW (KG): 48.4 Body mass index is 19.35 kg/m. Usual Weight: ~55kg  Assessment:  85 y.o. female with a known history of mild dementia, Gerd, hypertension, high cholesterol, GOO s/p gastrojejunostomy in 2011 admitted with 2 week h/o nausea and vomiting. Pt noted to have adynamic ileus   Pharmacy has been consulted to start TPN by surgery per dietician consult. - Patient diet is now thin fluids - continuing TPN today per dietary at goal rate  TPN Access: PICC line placed 4/14  TPN start date: 4/14  Nutritional Goals (per RD recommendation on 4/14):  KCal: 803-2122 kcal/day Protein: 48-72 g/day Fluid: 960-1460ml/day (1140-1661ml/day with Fat emulsion)  Starting rate 57ml/hr (provides 955ml/day) Goal TPN rate is 60 ml/hr (provides 1471ml/day)  Current Nutrition: NPO   Today's Plan:  Clinimix 5/15 with electrolytes TPN  Contiinue at 60 mL/hr (goal rate) x 24 hours starting 4/16 @ 1800  Fat Emulsion 20% at 25ml/hr x 12 hours per day  Baseline Electrolytes  K=4.6, Phos=3.4, Mg=2.3  Today's Electrolytes   K=3.6, Phos=3.2, Mg=1.8  Will give KCl IV runs x 4, no other electrolyte replenishment at this time (4/16)  Electrolytes in TPN: None added  Add MVI, trace elements, and thiamine 100mg  (1st 3 days only - today day #3 - consult with dietary tomorrow and likely d/c thiamine) to TPN  NS IVMF (D5, NS, etc.) now at 25 ml/hr (for total fluid/hour)  Monitor TPN labs - K, Phos, Mg daily for 3 days (high re-feed risk), then per protocol   F/U with am labs  Albina Billet, PharmD, BCPS Clinical Pharmacist 06/06/2018 10:43 AM

## 2018-06-07 LAB — BASIC METABOLIC PANEL
Anion gap: 5 (ref 5–15)
BUN: 16 mg/dL (ref 8–23)
CO2: 24 mmol/L (ref 22–32)
Calcium: 8.3 mg/dL — ABNORMAL LOW (ref 8.9–10.3)
Chloride: 109 mmol/L (ref 98–111)
Creatinine, Ser: 0.51 mg/dL (ref 0.44–1.00)
GFR calc Af Amer: >60 mL/min (ref >60)
GFR calc non Af Amer: >60 mL/min (ref >60)
Glucose, Bld: 131 mg/dL — ABNORMAL HIGH (ref 70–99)
Potassium: 4.4 mmol/L (ref 3.5–5.1)
Sodium: 138 mmol/L (ref 135–145)

## 2018-06-07 LAB — GLUCOSE, CAPILLARY
Glucose-Capillary: 104 mg/dL — ABNORMAL HIGH (ref 70–99)
Glucose-Capillary: 108 mg/dL — ABNORMAL HIGH (ref 70–99)
Glucose-Capillary: 119 mg/dL — ABNORMAL HIGH (ref 70–99)
Glucose-Capillary: 134 mg/dL — ABNORMAL HIGH (ref 70–99)

## 2018-06-07 LAB — MAGNESIUM: Magnesium: 1.9 mg/dL (ref 1.7–2.4)

## 2018-06-07 LAB — PHOSPHORUS: Phosphorus: 3.2 mg/dL (ref 2.5–4.6)

## 2018-06-07 MED ORDER — TRACE MINERALS CR-CU-MN-SE-ZN 10-1000-500-60 MCG/ML IV SOLN
INTRAVENOUS | Status: DC
  Administered 2018-06-07: 18:00:00 via INTRAVENOUS
  Filled 2018-06-07: qty 960

## 2018-06-07 MED ORDER — FAT EMULSION PLANT BASED 20 % IV EMUL
250.0000 mL | INTRAVENOUS | Status: AC
  Administered 2018-06-07: 18:00:00 250 mL via INTRAVENOUS
  Filled 2018-06-07: qty 250

## 2018-06-07 NOTE — TOC Progression Note (Signed)
Transition of Care [facility]) - Progression Note    Patient Details  Name: [name] MRN: [id number] Date of Birth: 03/12/1932  Transition of Care [facility]) CM/SW Contact  Chapman Fitch, RN Phone Number: 06/07/2018, 4:04 PM  Clinical Narrative:    RNCM received call back from daughter Junious Dresser. She confirms they are in agreement to home health and would like to use [facility]    Expected Discharge Plan: Home w Home Health Services Barriers to Discharge: Continued Medical Work up  Expected Discharge Plan and Services Expected Discharge Plan: Home w Home Health Services       Living arrangements for the past 2 months: Single Family Home                           Social Determinants of Health (SDOH) Interventions    Readmission Risk Interventions No flowsheet data found.

## 2018-06-07 NOTE — Progress Notes (Addendum)
[facility] SURGICAL PROGRESS NOTE (cpt [phone])  Hospital Day(s): 5.   Post op day(s): 2 Days Post-Op.   Interval History: Patient seen and examined, no acute events or new complaints overnight. Patient reports that she is doing very well this morning. No complaints of abdominal pain, nausea, emesis, fever, or chills. She is tolerating a full liquid diet and passing gas and having BMs.   Review of Systems:  Constitutional: denies fever, chills  Respiratory: denies any shortness of breath  Cardiovascular: denies chest pain or palpitations  Gastrointestinal: denies abdominal pain, N/V, or diarrhea/and bowel function as per interval history Genitourinary: denies burning with urination or urinary frequency   Vital signs in last 24 hours: [min-max] current  Temp:  [98.2 F (36.8 C)-98.5 F (36.9 C)] 98.5 F (36.9 C) (04/16 2001) Pulse Rate:  [67-79] 79 (04/16 2001) Resp:  [17-18] 18 (04/16 2001) BP: (107-121)/(52-64) 121/52 (04/16 2001) SpO2:  [98 %] 98 % (04/16 2001) Weight:  [51.9 kg] 51.9 kg (04/17 0500)     Height: 5\' 2"  (157.5 cm) Weight: 51.9 kg BMI (Calculated): 20.92   Intake/Output this shift:  Total I/O In: 504.9 [I.V.:504.9] Out: -     Physical Exam:  Constitutional: alert, cooperative and no distress  HENT: normocephalic without obvious abnormality  Respiratory: breathing non-labored at rest  Gastrointestinal: soft, non-tender, and non-distended Musculoskeletal: no edema or wounds, motor and sensation grossly intact, NT    Labs:  CBC Latest Ref Rng & Units 06/04/2018 06/03/2018 06/02/2018  WBC 4.0 - 10.5 K/uL 7.0 9.1 16.2(H)  Hemoglobin 12.0 - 15.0 g/dL 46.8 03.2 16.2(H)  Hematocrit 36.0 - 46.0 % 39.2 42.6 48.2(H)  Platelets 150 - 400 K/uL 121(L) 145(L) 176   CMP Latest Ref Rng & Units 06/07/2018 06/06/2018 06/05/2018  Glucose 70 - 99 mg/dL 122(Q) 825(O) 037(C)  BUN 8 - 23 mg/dL 16 14 12   Creatinine 0.44 - 1.00 mg/dL 4.88 8.91 6.94  Sodium 135 -  145 mmol/L 138 138 137  Potassium 3.5 - 5.1 mmol/L 4.4 3.6 3.4(L)  Chloride 98 - 111 mmol/L 109 106 102  CO2 22 - 32 mmol/L 24 26 28   Calcium 8.9 - 10.3 mg/dL 8.3(L) 8.3(L) 8.3(L)  Total Protein 6.5 - 8.1 g/dL - - -  Total Bilirubin 0.3 - 1.2 mg/dL - - -  Alkaline Phos 38 - 126 U/L - - -  AST 15 - 41 U/L - - -  ALT 0 - 44 U/L - - -     Imaging studies: No new pertinent imaging studies   Assessment/Plan: (ICD-10's: K62.669) 85 y.o. female who overall appear much improved, tolerating a diet, and having bowel function 2 Days Post-Op s/p EGD with dilation for stenosis of previous Billroth II anastomosis for gastric outlet obstruction in 2011, complicated by pertinent comorbidities including advanced chronological age and associated frailty,HTN, HLD, and mild dementia.   - Advance to soft diet; continue outpatient   - Wean TPN today; discontinue tomorrow   - Monitor abdominal examination   - No indication for surgical intervention currently  - She will need follow up with GI in 1-2 weeks after discharge; possible repeat EGD in a few weeks  - Medical management of comorbidities per primary team    - Discharge Planning: likely discharge tomorrow   All of the above findings and recommendations were discussed with the patient, and the medical team, and all of patient's questions were answered to her expressed satisfaction.  -- Lynden Oxford, PA-C [facility]  06/07/2018, 10:44 AM [phone] M-F: 7am - 4pm  I saw and evaluated the patient.  I agree with the above documentation, exam, and plan, which I have edited where appropriate. Duanne GuessJennifer Wassim Kirksey  10:49 AM

## 2018-06-07 NOTE — Progress Notes (Signed)
Physical Therapy Treatment Patient Details Name: [name] MRN: [id number] DOB: 03-07-1932 Today's Date: 06/07/2018    History of Present Illness [name] is an 85yo female comes to [facility] on 4/13 after 10-12d of N/V, dry heaving. Pt admitting with assumed ileus. Pt now s/p EGD. 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 PTA pt AMB limited community distances with [phone]  or shopping buggy.     PT Comments    Patient in bed, alert, oriented willing to participate with therapy. Able to perform therapeutic exercises with constant verbal and visual cueing, difficulty with L foot DF, AAROM. Pt ambulated ~352ft with RW and CGA, no complaints of fatigue, gait WFLs though pt tends to ambulate with RW outside base of support. Overall the patient demonstrated progress towards goals and would benefit from further skilled PT intervention.   Follow Up Recommendations  Home health PT     Equipment Recommendations  None recommended by PT    Recommendations for Other Services       Precautions / Restrictions Precautions Precautions: Fall Restrictions Weight Bearing Restrictions: No    Mobility  Bed Mobility Overal bed mobility: Modified Independent             General bed mobility comments: use of rail  Transfers Overall transfer level: Needs assistance Equipment used: Rolling walker (2 wheeled) Transfers: Sit to/from Stand Sit to Stand: Supervision         General transfer comment: cues for proper hand placement to stand; pt pulls from rw  Ambulation/Gait Ambulation/Gait assistance: Min guard Gait Distance (Feet): 360 Feet Assistive device: Rolling walker (2 wheeled) Gait Pattern/deviations: Trunk flexed;Step-through pattern     General Gait Details: Denies complaints of fatigue during ambulation (monitored several times).   Stairs             Wheelchair Mobility     Modified Rankin (Stroke Patients Only)       Balance Overall balance assessment: Needs assistance Sitting-balance support: Bilateral upper extremity supported;Feet supported Sitting balance-Leahy Scale: Good     Standing balance support: Bilateral upper extremity supported Standing balance-Leahy Scale: Fair                              Cognition Arousal/Alertness: Awake/alert Behavior During Therapy: WFL for tasks assessed/performed Overall Cognitive Status: Within Functional Limits for tasks assessed                                        Exercises General Exercises - Lower Extremity Long Arc Quad: AROM;Both;15 reps;Strengthening Hip Flexion/Marching: AROM;Strengthening;Both;15 reps Heel Raises: AROM;Strengthening;Both;15 reps Mini-Sqauts: Strengthening;AROM;Both;15 reps    General Comments        Pertinent Vitals/Pain Pain Assessment: No/denies pain    Home Living                      Prior Function            PT Goals (current goals can now be found in the care plan section) Progress towards PT goals: Progressing toward goals    Frequency    Min 2X/week      PT Plan Current plan remains appropriate    Co-evaluation              AM-PAC  PT "6 Clicks" Mobility   Outcome Measure  Help needed turning from your back to your side while in a flat bed without using bedrails?: A Little Help needed moving from lying on your back to sitting on the side of a flat bed without using bedrails?: A Little Help needed moving to and from a bed to a chair (including a wheelchair)?: A Little Help needed standing up from a chair using your arms (e.g., wheelchair or bedside chair)?: A Little Help needed to walk in hospital room?: A Little Help needed climbing 3-5 steps with a railing? : A Little 6 Click Score: 18    End of Session Equipment Utilized During Treatment: Gait belt Activity Tolerance: Patient tolerated  treatment well Patient left: in bed;with call bell/phone within reach;with bed alarm set Nurse Communication: Mobility status PT Visit Diagnosis: Other abnormalities of gait and mobility (R26.89);Muscle weakness (generalized) (M62.81);Difficulty in walking, not elsewhere classified (R26.2)     Time: [id number] PT Time Calculation (min) (ACUTE ONLY): 18 min  Charges:  $Therapeutic Exercise: 8-22 mins                     Olga Coasteriana Dublin Cantero PT, DPT 4:16 PM,06/07/18 [phone]

## 2018-06-07 NOTE — Consult Note (Signed)
PHARMACY - ADULT TOTAL PARENTERAL NUTRITION CONSULT NOTE   Pharmacy Consult for TPN Indication: Bowel Obstruction  Patient Measurements: Height: 5\' 2"  (157.5 cm) Weight: 114 lb 6.7 oz (51.9 kg) IBW/kg (Calculated) : 50.1 TPN AdjBW (KG): 48.4 Body mass index is 20.93 kg/m. Usual Weight: ~55kg  Assessment:  85 y.o. female with a known history of mild dementia, Gerd, hypertension, high cholesterol, GOO s/p gastrojejunostomy in 2011 admitted with 2 week h/o nausea and vomiting. Pt noted to have adynamic ileus   Pharmacy has been consulted to start TPN by surgery per dietician consult. - Patient diet is now thin fluids - continuing TPN today per dietary at reduced rate (from 37ml/hr to 17ml/hr) with plan to discontinue TPN tomorrow 4/18 and pt discharge  TPN Access: PICC line placed 4/14  TPN start date: 4/14  Nutritional Goals (per RD recommendation on 4/14):  KCal: 381-7711 kcal/day Protein: 48-72 g/day Fluid: 960-1468ml/day (1140-1649ml/day with Fat emulsion)  Starting rate 5ml/hr (provides 968ml/day) Goal TPN rate is 60 ml/hr (provides 1462ml/day) (back to start rate today 4/17 to wean)  Current Nutrition: Full liquid  Today's Plan:  Clinimix 5/15 with electrolytes TPN  Contiinue at 60 mL/hr (goal rate) x 24 hours starting 4/16 @ 1800  Fat Emulsion 20% at 2ml/hr x 12 hours per day  Baseline Electrolytes  K=4.6, Phos=3.4, Mg=2.3  Today's Electrolytes   K=4.4, Phos=3.2, Mg=1.9  No electrolyte replenishment at this time (4/17)  Electrolytes in TPN: None added  Add MVI and trace elements (3 days thiamine finished)  NS IVMF (D5, NS, etc.) now at 25 ml/hr (for total fluid/hour)  Monitor TPN labs - K, Phos, Mg daily for 3 days (high re-feed risk), then per protocol   F/U with am labs  Albina Billet, PharmD, BCPS Clinical Pharmacist 06/07/2018 9:50 AM

## 2018-06-07 NOTE — Progress Notes (Signed)
[facility] - [place] at [facility]   PATIENT NAME: [name]    MR#:  [id number]  DATE OF BIRTH:  02/20/1933  SUBJECTIVE:   Patient doing really well this morning.  Denies abdominal pain, nausea or vomiting. REVIEW OF SYSTEMS:    Review of Systems  Constitutional: Negative for fever, chills weight loss HENT: Negative for ear pain, nosebleeds, congestion, facial swelling, rhinorrhea, neck pain, neck stiffness and ear discharge.   Respiratory: Negative for cough, shortness of breath, wheezing  Cardiovascular: Negative for chest pain, palpitations and leg swelling.  Gastrointestinal: Negative for heartburn, abdominal pain, vomiting, diarrhea or consitpation Genitourinary: Negative for dysuria, urgency, frequency, hematuria Musculoskeletal: Negative for back pain or joint pain Neurological: Negative for dizziness, seizures, syncope, focal weakness,  numbness and headaches.  Hematological: Does not bruise/bleed easily.  Psychiatric/Behavioral: Negative for hallucinations, confusion, dysphoric mood    Tolerating Diet: yes     DRUG ALLERGIES:   Allergies  Allergen Reactions  . Colesevelam Other (See Comments)  . Lovastatin Other (See Comments)  . Tetracyclines & Related Rash    VITALS:  Blood pressure (!) 121/52, pulse 79, temperature 98.5 F (36.9 C), temperature source Oral, resp. rate 18, height 5\' 2"  (1.575 m), weight 51.9 kg, SpO2 98 %.  PHYSICAL EXAMINATION:  Constitutional: Appears thin and frail. No distress. HENT: Normocephalic. . Eyes: Conjunctivae and EOM are normal. PERRLA, no scleral icterus.  Neck: Normal ROM. Neck supple. No JVD. No tracheal deviation. CVS: RRR, S1/S2 +, no murmurs, no gallops, no carotid bruit.  Pulmonary: Effort and breath sounds normal, no stridor, rhonchi, wheezes, rales.  Abdominal: Soft. BS +,  no distension, tenderness, rebound or guarding.  Musculoskeletal: Normal range of motion. No edema and no tenderness.   Neuro: Alert. CN 2-12 grossly intact. No focal deficits. Skin: Skin is warm and dry. No rash noted. Psychiatric: Normal mood and affect.      LABORATORY PANEL:   CBC Recent Labs  Lab 06/04/18 0244  WBC 7.0  HGB 12.8  HCT 39.2  PLT 121*   ------------------------------------------------------------------------------------------------------------------  Chemistries  Recent Labs  Lab 06/02/18 2146  06/07/18 0438  NA 136   < > 138  K 3.0*   < > 4.4  CL 89*   < > 109  CO2 36*   < > 24  GLUCOSE 219*   < > 131*  BUN 11   < > 16  CREATININE 0.75   < > 0.51  CALCIUM 9.6   < > 8.3*  MG  --    < > 1.9  AST 20  --   --   ALT 18  --   --   ALKPHOS 65  --   --   BILITOT 0.9  --   --    < > = values in this interval not displayed.   ------------------------------------------------------------------------------------------------------------------  Cardiac Enzymes No results for input(s): TROPONINI in the last 168 hours. ------------------------------------------------------------------------------------------------------------------  RADIOLOGY:  No results found.   ASSESSMENT AND PLAN:    85 year old female with history of dementia and hypertension who presented to the ER due to nausea and vomiting.  1.  Nausea and vomiting with abdominal distention: This is due to stenosis of previous gastrojejunostomy. Patient underwent EGD and stricture was dilated.    Plan to  advance diet and discontinue TPN Appreciate GI/surgery consult  F?U GI in 1-2 weeks after d/c for possible repeat EGD  2  Hypokalemia: Replete PRN 3.  Leukocytosis:Reactive in nature  4.  Hyperglycemia with A1c in February of 6.2 5.  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 Patient followed by patient neurosurgery.  Management plans discussed with the patient and she is in agreement.  CODE STATUS: full  TOTAL TIME  TAKING CARE OF THIS PATIENT: 22 minutes.   D/w surgery  POSSIBLE D/C tomorrow with [facility] DEPENDING ON CLINICAL CONDITION.   Adrian Saran M.D on 06/07/2018 at 11:12 AM  Between 7am to 6pm - Pager - [phone] After 6pm go to [email or web address] - password [email or web address]  [facility]  Office  [phone]  CC: Primary care physician; Default, Provider, MD  Note: This dictation was prepared with Dragon dictation along with smaller phrase technology. Any transcriptional errors that result from this process are unintentional.

## 2018-06-08 LAB — GLUCOSE, CAPILLARY
Glucose-Capillary: 107 mg/dL — ABNORMAL HIGH (ref 70–99)
Glucose-Capillary: 115 mg/dL — ABNORMAL HIGH (ref 70–99)
Glucose-Capillary: 128 mg/dL — ABNORMAL HIGH (ref 70–99)
Glucose-Capillary: 99 mg/dL (ref 70–99)

## 2018-06-08 NOTE — Progress Notes (Signed)
At this time, RD on call has not return a call yet.

## 2018-06-08 NOTE — Progress Notes (Addendum)
I have paged registered dietician on call three times regarding pt.TPN because Md is attempting to discharge patient. At this time I am still waiting on a return call from RD on call.

## 2018-06-08 NOTE — Progress Notes (Signed)
Discharge order received. Patient is alert and oriented. Pt. was weaned off TPN. CBG within normal range. Vital signs stable . PICC line removed. No signs of acute distress. Discharge instructions given. Patient verbalized understanding. No other issues noted at this time.

## 2018-06-08 NOTE — Discharge Summary (Signed)
[facility] - [place] at [facility]   PATIENT NAME: [name]    MR#:  [id number]  DATE OF BIRTH:  03/26/1932  DATE OF ADMISSION:  06/02/2018 ADMITTING PHYSICIAN: Enedina Finner, MD  DATE OF DISCHARGE: 06/08/2018   PRIMARY CARE PHYSICIAN: Marguarite Arbour, MD    ADMISSION DIAGNOSIS:  Gastric distention [K31.89] Intractable vomiting with nausea, unspecified vomiting type [R11.2]  DISCHARGE DIAGNOSIS:  Active Problems:   Ileus (HCC)   Gastric outlet obstruction   Stricture of bowel (HCC)   SECONDARY DIAGNOSIS:   Past Medical History:  Diagnosis Date  . GERD (gastroesophageal reflux disease)   . High cholesterol   . Hypertension     HOSPITAL COURSE:   85 year old female with history of dementia and hypertension who presented to the ER due to nausea and vomiting.  1.  Nausea and vomiting with abdominal distention: This is due to stenosis of previous gastrojejunostomy. Patient underwent EGD and stricture was dilated.    She was initally on TPN which was being weaned off. She has tolerated a regular diet.  She will need to follow up with Dr Servando Snare in 1-2 weeks after d/c for possible repeat EGD  2  Hypokalemia: This was repleted PRN 3.  Leukocytosis:Reactive in nature  4.  Hyperglycemia with A1c in February of 6.2 5.  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 Patient followed by outpatient neurosurgery.   DISCHARGE CONDITIONS AND DIET:   Stable  Regular diet  CONSULTS OBTAINED:  Treatment Team:  Pasty Spillers, MD  DRUG ALLERGIES:   Allergies  Allergen Reactions  . Colesevelam Other (See Comments)  . Lovastatin Other (See Comments)  . Tetracyclines & Related Rash    DISCHARGE MEDICATIONS:   Allergies as of 06/08/2018      Reactions   Colesevelam Other (See Comments)   Lovastatin Other (See Comments)   Tetracyclines & Related Rash       Medication List    STOP taking these medications   ciprofloxacin 500 MG tablet Commonly known as:  CIPRO   ondansetron 4 MG disintegrating tablet Commonly known as:  ZOFRAN-ODT     TAKE these medications   amLODipine 5 MG tablet Commonly known as:  NORVASC Take 1 tablet by mouth daily.   atorvastatin 20 MG tablet Commonly known as:  LIPITOR Take 1 tablet by mouth at bedtime.   dicyclomine 10 MG capsule Commonly known as:  BENTYL Take 10 mg by mouth 3 (three) times daily before meals.   fentaNYL 100 MCG/HR Commonly known as:  DURAGESIC Place 1 patch onto the skin every 3 (three) days.   folic acid 1 MG tablet Commonly known as:  FOLVITE Take 1 tablet by mouth daily.   omeprazole 20 MG capsule Commonly known as:  PRILOSEC Take by mouth.   oxyCODONE-acetaminophen 10-325 MG tablet Commonly known as:  PERCOCET Take 1 tablet by mouth every 8 (eight) hours as needed for pain.   vitamin B-12 1000 MCG tablet Commonly known as:  CYANOCOBALAMIN Take 1 tablet by mouth daily.         Today   CHIEF COMPLAINT:  Doing well wants to go home no abdominal pian tolerated diet   VITAL SIGNS:  Blood pressure (!) 111/52, pulse 77, temperature 99 F (37.2 C), temperature source Oral, resp. rate 16, height 5\' 2"  (1.575 m), weight 51.9 kg, SpO2 100 %.   REVIEW OF SYSTEMS:  Review of Systems  Constitutional: Negative.  Negative for chills, fever and malaise/fatigue.  HENT: Negative.  Negative for ear discharge, ear pain, hearing loss, nosebleeds and sore throat.   Eyes: Negative.  Negative for blurred vision and pain.  Respiratory: Negative.  Negative for cough, hemoptysis, shortness of breath and wheezing.   Cardiovascular: Negative.  Negative for chest pain, palpitations and leg swelling.  Gastrointestinal: Negative.  Negative for abdominal pain, blood in stool, diarrhea, nausea and vomiting.  Genitourinary: Negative.  Negative for dysuria.  Musculoskeletal: Negative.   Negative for back pain.  Skin: Negative.   Neurological: Negative for dizziness, tremors, speech change, focal weakness, seizures and headaches.  Endo/Heme/Allergies: Negative.  Does not bruise/bleed easily.  Psychiatric/Behavioral: Negative.  Negative for depression, hallucinations and suicidal ideas.     PHYSICAL EXAMINATION:  GENERAL:  85 y.o.-year-old patient lying in the bed with no acute distress.  NECK:  Supple, no jugular venous distention. No thyroid enlargement, no tenderness.  LUNGS: Normal breath sounds bilaterally, no wheezing, rales,rhonchi  No use of accessory muscles of respiration.  CARDIOVASCULAR: S1, S2 normal. No murmurs, rubs, or gallops.  ABDOMEN: Soft, non-tender, non-distended. Bowel sounds present. No organomegaly or mass.  EXTREMITIES: No pedal edema, cyanosis, or clubbing.  PSYCHIATRIC: The patient is alert and oriented x 3.  SKIN: No obvious rash, lesion, or ulcer.   DATA REVIEW:   CBC Recent Labs  Lab 06/04/18 0244  WBC 7.0  HGB 12.8  HCT 39.2  PLT 121*    Chemistries  Recent Labs  Lab 06/02/18 2146  06/07/18 0438  NA 136   < > 138  K 3.0*   < > 4.4  CL 89*   < > 109  CO2 36*   < > 24  GLUCOSE 219*   < > 131*  BUN 11   < > 16  CREATININE 0.75   < > 0.51  CALCIUM 9.6   < > 8.3*  MG  --    < > 1.9  AST 20  --   --   ALT 18  --   --   ALKPHOS 65  --   --   BILITOT 0.9  --   --    < > = values in this interval not displayed.    Cardiac Enzymes No results for input(s): TROPONINI in the last 168 hours.  Microbiology Results  @MICRORSLT48 @  RADIOLOGY:  No results found.    Allergies as of 06/08/2018      Reactions   Colesevelam Other (See Comments)   Lovastatin Other (See Comments)   Tetracyclines & Related Rash      Medication List    STOP taking these medications   ciprofloxacin 500 MG tablet Commonly known as:  CIPRO   ondansetron 4 MG disintegrating tablet Commonly known as:  ZOFRAN-ODT     TAKE these medications    amLODipine 5 MG tablet Commonly known as:  NORVASC Take 1 tablet by mouth daily.   atorvastatin 20 MG tablet Commonly known as:  LIPITOR Take 1 tablet by mouth at bedtime.   dicyclomine 10 MG capsule Commonly known as:  BENTYL Take 10 mg by mouth 3 (three) times daily before meals.   fentaNYL 100 MCG/HR Commonly known as:  DURAGESIC Place 1 patch onto the skin every 3 (three) days.   folic acid 1 MG tablet Commonly known as:  FOLVITE Take 1 tablet by mouth daily.   omeprazole 20 MG capsule Commonly known as:  PRILOSEC Take by mouth.   oxyCODONE-acetaminophen 10-325 MG  tablet Commonly known as:  PERCOCET Take 1 tablet by mouth every 8 (eight) hours as needed for pain.   vitamin B-12 1000 MCG tablet Commonly known as:  CYANOCOBALAMIN Take 1 tablet by mouth daily.           Management plans discussed with the patient and she is in agreement. Stable for discharge home with [facility]  Patient should follow up with dr Servando Snare 1 week CODE STATUS:     Code Status Orders  (From admission, onward)         Start     Ordered   06/03/18 0049  Full code  Continuous     06/03/18 0048        Code Status History    This patient has a current code status but no historical code status.    Advance Directive Documentation     Most Recent Value  Type of Advance Directive  Healthcare Power of Attorney  Pre-existing out of facility DNR order (yellow form or pink MOST form)  -  "MOST" Form in Place?  -      TOTAL TIME TAKING CARE OF THIS PATIENT: 38 minutes.    Note: This dictation was prepared with Dragon dictation along with smaller phrase technology. Any transcriptional errors that result from this process are unintentional.  Adrian Saran M.D on 06/08/2018 at 10:45 AM  Between 7am to 6pm - Pager - [phone] After 6pm go to [email or web address] - Social research officer, government  [facility] [place] [facility]  Office  [phone]  CC: Primary care physician; Marguarite Arbour, MD

## 2018-06-08 NOTE — TOC Transition Note (Signed)
Transition of Care [facility]) - CM/SW Discharge Note   Patient Details  Name: [name] MRN: [id number] Date of Birth: [birth year]  [facility] CM/SW Contact:  Virgel Manifold, RN Phone Number: 06/08/2018, 11:52 AM   Clinical Narrative:  Patient to be discharged per MD order. Orders in place for home health services. Patient was previously set up with [facility]. Notified Barbara Cower from [facility] of pending discharge. No DME needs. Family to transport.      Final next level of care: Home w Home Health Services Barriers to Discharge: No Barriers Identified   Patient Goals and CMS Choice Patient states their goals for this hospitalization and ongoing recovery are:: "im ready to get this tube out" CMS Medicare.gov Compare Post Acute Care list provided to:: Patient Choice offered to / list presented to : Patient  Discharge Placement                       Discharge Plan and Services                    HH Arranged: RN, PT, Nurse's Aide HH Agency: [facility]   Social Determinants of Health (SDOH) Interventions     Readmission Risk Interventions Readmission Risk Prevention Plan 06/08/2018  Post Dischage Appt Complete  Medication Screening Complete  Transportation Screening Complete  Some recent data might be hidden

## 2018-06-08 NOTE — Progress Notes (Addendum)
[facility] SURGICAL PROGRESS NOTE   Hospital Day(s): 6.   Post op day(s): 3 Days Post-Op.   Interval History: Patient seen and examined, no acute events or new complaints overnight. Patient reports that she is doing very well this morning. No complaints of abdominal pain, nausea, emesis, fever, or chills. She is tolerating a soft diet and passing gas and having BMs.   Review of Systems:  Constitutional: denies fever, chills  Respiratory: denies any shortness of breath  Cardiovascular: denies chest pain or palpitations  Gastrointestinal: denies abdominal pain, N/V, or diarrhea/and bowel function as per interval history Genitourinary: denies burning with urination or urinary frequency   Vital signs in last 24 hours: [min-max] current  Temp:  [98.3 F (36.8 C)-99 F (37.2 C)] 99 F (37.2 C) (04/18 0510) Pulse Rate:  [67-77] 77 (04/18 0510) Resp:  [16-17] 16 (04/18 0510) BP: (111-124)/(50-55) 111/52 (04/18 0510) SpO2:  [98 %-100 %] 100 % (04/18 0510)     Height: 5\' 2"  (157.5 cm) Weight: 51.9 kg BMI (Calculated): 20.92   Intake/Output this shift:  Total I/O In: 291.9 [I.V.:291.9] Out: -     Physical Exam:  Constitutional: alert, cooperative and no distress  HENT: normocephalic without obvious abnormality  Respiratory: breathing non-labored at rest  Gastrointestinal: soft, non-tender, and non-distended Musculoskeletal: no edema or wounds, motor and sensation grossly intact, NT    Labs:  CBC Latest Ref Rng & Units 06/04/2018 06/03/2018 06/02/2018  WBC 4.0 - 10.5 K/uL 7.0 9.1 16.2(H)  Hemoglobin 12.0 - 15.0 g/dL 20.6 01.5 16.2(H)  Hematocrit 36.0 - 46.0 % 39.2 42.6 48.2(H)  Platelets 150 - 400 K/uL 121(L) 145(L) 176   CMP Latest Ref Rng & Units 06/07/2018 06/06/2018 06/05/2018  Glucose 70 - 99 mg/dL 615(P) 794(F) 276(D)  BUN 8 - 23 mg/dL 16 14 12   Creatinine 0.44 - 1.00 mg/dL 4.70 9.29 5.74  Sodium 135 - 145 mmol/L 138 138 137  Potassium 3.5 - 5.1 mmol/L 4.4  3.6 3.4(L)  Chloride 98 - 111 mmol/L 109 106 102  CO2 22 - 32 mmol/L 24 26 28   Calcium 8.9 - 10.3 mg/dL 8.3(L) 8.3(L) 8.3(L)  Total Protein 6.5 - 8.1 g/dL - - -  Total Bilirubin 0.3 - 1.2 mg/dL - - -  Alkaline Phos 38 - 126 U/L - - -  AST 15 - 41 U/L - - -  ALT 0 - 44 U/L - - -     Imaging studies: No new pertinent imaging studies   Assessment/Plan: (ICD-10's: K68.669) 85 y.o. female who overall appear much improved, tolerating a diet, and having bowel function 3 Days Post-Op s/p EGD with dilation for stenosis of previous Billroth II anastomosis for gastric outlet obstruction in 2011, complicated by pertinent comorbidities including advanced chronological age and associated frailty,HTN, HLD, and mild dementia.   - OK to d/c from surgery perspective, once off TPN   - D/C TPN (taper appropriately to avoid hypoglycemia)  - No indication for surgical intervention currently  - She will need follow up with GI in 1-2 weeks after discharge; possible repeat EGD in a few weeks  - Medical management of comorbidities per primary team    - Discharge Planning: OK do d/c from surgery standpoint.  We will sign off.  All of the above findings and recommendations were discussed with the patient, and the medical team, and all of patient's questions were answered to her expressed satisfaction.

## 2018-06-18 ENCOUNTER — Encounter: Payer: Self-pay | Admitting: Gastroenterology

## 2018-06-18 ENCOUNTER — Ambulatory Visit (INDEPENDENT_AMBULATORY_CARE_PROVIDER_SITE_OTHER): Payer: Medicare Other | Admitting: Gastroenterology

## 2018-06-18 ENCOUNTER — Other Ambulatory Visit: Payer: Self-pay

## 2018-06-18 DIAGNOSIS — E785 Hyperlipidemia, unspecified: Secondary | ICD-10-CM | POA: Insufficient documentation

## 2018-06-18 DIAGNOSIS — K649 Unspecified hemorrhoids: Secondary | ICD-10-CM

## 2018-06-18 DIAGNOSIS — E119 Type 2 diabetes mellitus without complications: Secondary | ICD-10-CM | POA: Insufficient documentation

## 2018-06-18 DIAGNOSIS — K311 Adult hypertrophic pyloric stenosis: Secondary | ICD-10-CM

## 2018-06-18 DIAGNOSIS — E669 Obesity, unspecified: Secondary | ICD-10-CM | POA: Insufficient documentation

## 2018-06-18 DIAGNOSIS — E1121 Type 2 diabetes mellitus with diabetic nephropathy: Secondary | ICD-10-CM | POA: Insufficient documentation

## 2018-06-18 DIAGNOSIS — I10 Essential (primary) hypertension: Secondary | ICD-10-CM | POA: Insufficient documentation

## 2018-06-18 DIAGNOSIS — M1990 Unspecified osteoarthritis, unspecified site: Secondary | ICD-10-CM | POA: Insufficient documentation

## 2018-06-18 HISTORY — DX: Unspecified hemorrhoids: K64.9

## 2018-06-18 NOTE — Progress Notes (Signed)
Brittany Minium, MD [address]  [address]  [place], [place] [address]  Main: [phone]  Fax: [phone]    Gastroenterology Virtual/Video Visit  Referring Provider:     Marguarite Arbour, MD Primary Care Physician:  Marguarite Arbour, MD Primary Gastroenterologist:  Dr.Aaryan Essman Servando Snare Reason for Consultation:     Follow up after hospital discharge.        HPI:    Virtual Visit via Video Note Location of the patient: Home Location of provider: Office  Participating persons: The patient myself and [name].  I connected with CORDIA MIKLOS on 06/18/18 at 11:30 AM EDT by a video enabled telemedicine application and verified that I am speaking with the correct person using two identifiers.   I discussed the limitations of evaluation and management by telemedicine and the availability of in person appointments. The patient expressed understanding and agreed to proceed.  Verbal consent to proceed obtained.  History of Present Illness: [name] is a 85 y.o. female who was recently in the hospital and seen by Dr. Maximino Greenland while in the hospital for nausea and vomiting.  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  The patient had an upper endoscopy that could not be performed by Dr. Maximino Greenland and she requested that I do the procedure.  The procedure was being done due to anastomotic stricture with inability to pass the scope be on the stricture.  The patient was then brought down the following day and had an upper endoscopy by me with dilation of the stenosis.  The patient had her NG tube removed and did very well after the procedure and was discharged home.  The patient now reports that she has been eating and drinking without any problem and doing well.  It was planned at the last endoscopy that the patient would have a repeat endoscopy in 4 weeks after the last one.  She denies any nausea  vomiting at the present time and states that she is just trying to get her strength back.  Past Medical History:  Diagnosis Date   GERD (gastroesophageal reflux disease)    High cholesterol    Hypertension     Past Surgical History:  Procedure Laterality Date   ABDOMINAL SURGERY     BACK SURGERY     CHOLECYSTECTOMY     ESOPHAGOGASTRODUODENOSCOPY N/A 06/04/2018   Procedure: ESOPHAGOGASTRODUODENOSCOPY (EGD);  Surgeon: Pasty Spillers, MD;  Location: [facility] ENDOSCOPY;  Service: Endoscopy;  Laterality: N/A;   ESOPHAGOGASTRODUODENOSCOPY N/A 06/05/2018   Procedure: ESOPHAGOGASTRODUODENOSCOPY (EGD);  Surgeon: Brittany Minium, MD;  Location: [facility] ENDOSCOPY;  Service: Endoscopy;  Laterality: N/A;  use Fluoro bed, X-ray is aware   REPLACEMENT TOTAL KNEE Right    TOE AMPUTATION Left    great toe   TONSILLECTOMY      Prior to Admission medications   Medication Sig Start Date End Date Taking? Authorizing Provider  amLODipine (NORVASC) 5 MG tablet Take 1 tablet by mouth daily. 01/05/16   [provider]  atorvastatin (LIPITOR) 20 MG tablet Take 1 tablet by mouth at bedtime. 09/12/16   [provider]  dicyclomine (BENTYL) 10 MG capsule Take 10 mg by mouth 3 (three) times daily before meals.  05/22/18 06/01/18  [provider]  fentaNYL (DURAGESIC) 100 MCG/HR Place 1 patch onto the skin every 3 (three) days. 03/12/18   [provider]  folic acid (FOLVITE)  1 MG tablet Take 1 tablet by mouth daily. 04/10/18   [provider]  omeprazole (PRILOSEC) 20 MG capsule Take by mouth. 07/05/15   [provider]  oxyCODONE-acetaminophen (PERCOCET) 10-325 MG tablet Take 1 tablet by mouth every 8 (eight) hours as needed for pain.  03/12/18   [provider]  vitamin B-12 (CYANOCOBALAMIN) 1000 MCG tablet Take 1 tablet by mouth daily.    [provider]    No family history on file.   Social History   Tobacco Use   Smoking status:  Never Smoker   Smokeless tobacco: Never Used  Substance Use Topics   Alcohol use: Never    Frequency: Never   Drug use: Never    Allergies as of 06/18/2018 - Review Complete 06/05/2018  Allergen Reaction Noted   Colesevelam Other (See Comments) 06/02/2018   Lovastatin Other (See Comments) 06/02/2018   Tetracyclines & related Rash 06/02/2018    Review of Systems:    All systems reviewed and negative except where noted in HPI.   Observations/Objective:  Labs: CBC    Component Value Date/Time   WBC 7.0 06/04/2018 0244   RBC 4.18 06/04/2018 0244   HGB 12.8 06/04/2018 0244   HCT 39.2 06/04/2018 0244   PLT 121 (L) 06/04/2018 0244   MCV 93.8 06/04/2018 0244   MCH 30.6 06/04/2018 0244   MCHC 32.7 06/04/2018 0244   RDW 13.2 06/04/2018 0244   LYMPHSABS 0.6 (L) 08/17/2006 0540   MONOABS 0.7 08/17/2006 0540   EOSABS 0.2 08/17/2006 0540   BASOSABS 0.0 08/17/2006 0540   CMP     Component Value Date/Time   NA 138 06/07/2018 0438   K 4.4 06/07/2018 0438   CL 109 06/07/2018 0438   CO2 24 06/07/2018 0438   GLUCOSE 131 (H) 06/07/2018 0438   BUN 16 06/07/2018 0438   CREATININE 0.51 06/07/2018 0438   CALCIUM 8.3 (L) 06/07/2018 0438   PROT 7.1 06/02/2018 2146   ALBUMIN 3.9 06/02/2018 2146   AST 20 06/02/2018 2146   ALT 18 06/02/2018 2146   ALKPHOS 65 06/02/2018 2146   BILITOT 0.9 06/02/2018 2146   GFRNONAA >60 06/07/2018 0438   GFRAA >60 06/07/2018 0438    Imaging Studies: Dg Abdomen 1 View  Result Date: 06/03/2018 CLINICAL DATA:  85 y/o  F; NG tube placement. EXAM: ABDOMEN - 1 VIEW COMPARISON:  06/02/2018 CT abdomen and pelvis. FINDINGS: Enteric tube tip projects over the proximal stomach. Right upper quadrant cholecystectomy clips. Lumbar fusion hardware noted. Bones are demineralized. There is contrast accumulation within the bladder. Nonobstructive bowel gas pattern. IMPRESSION: Enteric tube tip projects over proximal stomach. Electronically Signed   By: Mitzi Hansen M.D.   On: 06/03/2018 00:33   Ct Abdomen Pelvis W Contrast  Result Date: 06/02/2018 CLINICAL DATA:  Acute generalized abdominal pain. EXAM: CT ABDOMEN AND PELVIS WITH CONTRAST TECHNIQUE: Multidetector CT imaging of the abdomen and pelvis was performed using the standard protocol following bolus administration of intravenous contrast. CONTRAST:  61mL OMNIPAQUE IOHEXOL 300 MG/ML  SOLN COMPARISON:  CT scan of May 29, 2018. FINDINGS: Lower chest: No acute abnormality. Hepatobiliary: No focal liver abnormality is seen. Status post cholecystectomy. No biliary dilatation. Pancreas: Unremarkable. No pancreatic ductal dilatation or surrounding inflammatory changes. Spleen: Normal in size without focal abnormality. Adrenals/Urinary Tract: Adrenal glands appear normal. Left renal cyst is noted. No hydronephrosis or renal obstruction is noted. No renal or ureteral calculi are noted. Urinary bladder is unremarkable. Stomach/Bowel: Mild gastric distention  is noted. No definite evidence of large or small bowel obstruction or inflammation is noted. The appendix is not visualized. Vascular/Lymphatic: Aortic atherosclerosis. No enlarged abdominal or pelvic lymph nodes. Reproductive: Uterus and bilateral adnexa are unremarkable. Other: No abdominal wall hernia or abnormality. No abdominopelvic ascites. Musculoskeletal: No acute or significant osseous findings. IMPRESSION: Mild gastric distention is noted. No definite evidence of large or small bowel obstruction or inflammation. Aortic Atherosclerosis (ICD10-I70.0). Electronically Signed   By: Lupita RaiderJames  Green Jr, M.D.   On: 06/02/2018 23:23   Ct Abdomen Pelvis W Contrast  Result Date: 05/29/2018 CLINICAL DATA:  Pt states she's been vomiting for 2 weeks, unable to hold food down. Denies fever. Extreme weakness. Hx "2011 blockage and reroute of stomach" EXAM: CT ABDOMEN AND PELVIS WITH CONTRAST TECHNIQUE: Multidetector CT imaging of the abdomen and pelvis was  performed using the standard protocol following bolus administration of intravenous contrast. CONTRAST:  75mL OMNIPAQUE IOHEXOL 300 MG/ML  SOLN COMPARISON:  11/09/2009. FINDINGS: Lower chest: No acute abnormality. Hepatobiliary: No focal liver abnormality is seen. Status post cholecystectomy. No biliary dilatation. Pancreas: Unremarkable. No pancreatic ductal dilatation or surrounding inflammatory changes. Spleen: Normal in size without focal abnormality. Adrenals/Urinary Tract: No adrenal masses. Kidneys normal in overall size and position. There is symmetric renal enhancement and excretion. 4 mm low-density lesion, lower pole the right kidney, likely a cyst. 11 mm cyst anterior upper pole of the left kidney. No other renal masses, no stones and no hydronephrosis. Normal ureters. Normal bladder. Stomach/Bowel: Stomach is moderately distended. No wall thickening or inflammation. Small bowel is normal in caliber. No wall thickening or adjacent inflammation. There is distention of the right colon and transverse colon, maximum diameter, 6.8 cm. There is no wall thickening or adjacent inflammation. Bowel anastomosis staple line noted along proximal small bowel which lies adjacent to the anterior stomach. Scattered air-fluid levels noted in the distended colon and in several small bowel loops. Vascular/Lymphatic: Aortic atherosclerosis. No aneurysm. No enlarged lymph nodes. Reproductive: Uterus and bilateral adnexa are unremarkable. Other: No abdominal wall hernia.  No ascites. Musculoskeletal: Posterior lumbar spine fusion, L1 through S1, focal kyphosis at L2. No acute fracture. No osteoblastic or osteolytic lesions. IMPRESSION: 1. Distended right and transverse colon without wall thickening or inflammation and without convincing bowel obstruction. Small bowel is normal in caliber with no wall thickening or inflammation. Moderate distention of the stomach with no evidence of stomach inflammation. There are colonic and  small bowel air-fluid levels. This suggests an adynamic ileus. 2. There are chronic findings including changes from a cholecystectomy and aortic atherosclerosis and changes from lumbar spine fusion surgery. Electronically Signed   By: Amie Portlandavid  Ormond M.D.   On: 05/29/2018 16:06   Dg Kayleen MemosUgi W Single Cm (sol Or Thin Ba)  Result Date: 06/03/2018 CLINICAL DATA:  History of gastric outlet obstruction with prior gastrojejunostomy in 2011. EXAM: UPPER GI SERIES WITHOUT KUB TECHNIQUE: Routine upper GI series was performed with thin barium. FLUOROSCOPY TIME:  Fluoroscopy Time:  2 minutes and 12 seconds. COMPARISON:  Abdominal film on 06/03/2018, CT of the abdomen on 06/02/2018 and prior upper GI on 02/09/2010 FINDINGS: Thin barium was administered through a pre-existing nasogastric tube which was positioned in the proximal stomach. Multiple fluoroscopic spot images were obtained in different positions. The stomach was initially decompressed by the pre-existing nasogastric tube. After initial partial distention of the stomach with barium, there was no visualized outflow into the jejunum. After the patient was rolled on her side, the jejunum  was visualized. Different projections do not show obvious anastomotic ulceration, mass or leak. Anastomotic stricture may be present and is not able to be fully evaluated on the current study. However, the presence of stricture may be implicated by the relatively slow visualization of outflow into the jejunum. IMPRESSION: Initial lack of visualization of outflow of barium from the stomach into the jejunum. Eventually, the jejunum was visualized with slow flow of barium into the jejunum. No obvious anastomotic ulcer or mass. No evidence of leak. Although anastomotic stricture is not able to be fully visualized, the presence of stricture may be implicated by the relatively slow visualization of outflow into the jejunum. Electronically Signed   By: Irish Lack M.D.   On: 06/03/2018 12:45     [place] Ekg Site Rite  Result Date: 06/04/2018 If Site Rite image not attached, placement could not be confirmed due to current cardiac rhythm.   Assessment and Plan:   Maanya ZAHIRAH CHESLOCK is a 85 y.o. 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  The patient had dilation of the stricture and was told to have a repeat dilation in 4 weeks.  The patient is now being contacted to see how she is doing and she reports that she is doing well without any nausea or vomiting.  The patient will be set up for repeat EGD with dilation of her stenosis in the next few weeks.  The patient has been explained the plan and agrees with it.  Follow Up Instructions:  I discussed the assessment and treatment plan with the patient. The patient was provided an opportunity to ask questions and all were answered. The patient agreed with the plan and demonstrated an understanding of the instructions.   The patient was advised to call back or seek an in-person evaluation if the symptoms worsen or if the condition fails to improve as anticipated.  I provided 10 minutes of non-face-to-face time during this encounter.   Brittany Minium, MD  Speech recognition software was used to dictate the above note.

## 2018-07-16 ENCOUNTER — Telehealth: Payer: Self-pay | Admitting: Gastroenterology

## 2018-07-16 NOTE — Telephone Encounter (Signed)
Dr Wendee Copp called & l/m on v/m asking Dr Servando Snare to call him tomorrow @ [phone] about patient.

## 2018-07-17 NOTE — Telephone Encounter (Signed)
Can you sent up a video meeting with this patient or have her come in to see how she is doing. She may need repeat dilation from her gastric stricture. Thanks.

## 2018-07-18 NOTE — Telephone Encounter (Signed)
Please schedule an office appt per Dr. Servando Snare. If she is able to do virtual appt, she can be offered that as well.

## 2018-07-29 ENCOUNTER — Encounter: Payer: Self-pay | Admitting: Gastroenterology

## 2018-07-29 ENCOUNTER — Ambulatory Visit (INDEPENDENT_AMBULATORY_CARE_PROVIDER_SITE_OTHER): Payer: Medicare Other | Admitting: Gastroenterology

## 2018-07-29 ENCOUNTER — Other Ambulatory Visit: Payer: Self-pay

## 2018-07-29 DIAGNOSIS — K311 Adult hypertrophic pyloric stenosis: Secondary | ICD-10-CM

## 2018-07-29 NOTE — Progress Notes (Signed)
Brittany Miniumarren Ezra Marquess, MD [address]  [address]  [place], [place] [address]  Main: [phone]  Fax: [phone]    Gastroenterology telephone visit  Referring Provider:     Marguarite [name], Jeffrey D, MD Primary Care Physician:  [name], Jeffrey D, MD Primary Gastroenterologist:  Dr.Khila Papp [name] Reason for Consultation:     Gastric outlet obstruction        HPI:    Virtual Visit via Video Note Location of the patient: Home Location of provider: Office  Participating persons: The patient myself and [name].  I connected with [name] on 07/29/18 at  2:00 PM EDT by a telephone telemedicine application and verified that I am speaking with the correct person using two identifiers.   I discussed the limitations of evaluation and management by telemedicine and the availability of in person appointments. The patient expressed understanding and agreed to proceed.  Verbal consent to proceed obtained.  History of Present Illness: [name] is a 85 y.o. female referred by Dr. Judithann [name], Brittany LopeJeffrey D, MD  for consultation & management of gastric outlet obstruction.  This patient has been in the hospital and under the care of 1 of my partners while in the hospital.  The patient underwent a upper endoscopy for a stricture that appears to be post Billroth surgery.  The patient had an EGD by Dr. Maximino [name] with inability to pass the scope beyond the stricture.  She had contacted me and the patient had an upper endoscopy the following day by myself and I was able to transverse the stricture and dilated.  The patient was suggested to come back for repeat dilation in 6 weeks.  The patient had been doing well post dilation with discharge from the hospital after the NG tube was removed.  The patient now reports that she is gaining her strength back and her weight has been stable.  She has had no further vomiting since being discharged.  She also denies any feeling of fullness or bloating.  Past  Medical History:  Diagnosis Date  . GERD (gastroesophageal reflux disease)   . High cholesterol   . Hypertension     Past Surgical History:  Procedure Laterality Date  . ABDOMINAL SURGERY    . BACK SURGERY    . CHOLECYSTECTOMY    . ESOPHAGOGASTRODUODENOSCOPY N/A 06/04/2018   Procedure: ESOPHAGOGASTRODUODENOSCOPY (EGD);  Surgeon: [name], Brittany B, MD;  [facility];  Service: Endoscopy;  Laterality: N/A;  . ESOPHAGOGASTRODUODENOSCOPY N/A 06/05/2018   Procedure: ESOPHAGOGASTRODUODENOSCOPY (EGD);  Surgeon: Brittany MiniumWohl, Brittany Ozment, MD;  [facility];  Service: Endoscopy;  Laterality: N/A;  use Fluoro bed, X-ray is aware  . REPLACEMENT TOTAL KNEE Right   . TOE AMPUTATION Left    great toe  . TONSILLECTOMY      Prior to Admission medications   Medication Sig Start Date End Date Taking? Authorizing Provider  amLODipine (NORVASC) 5 MG tablet Take 1 tablet by mouth daily. 01/05/16   [provider]  atorvastatin (LIPITOR) 20 MG tablet Take 1 tablet by mouth at bedtime. 09/12/16   [provider]  dicyclomine (BENTYL) 10 MG capsule Take 10 mg by mouth 3 (three) times daily before meals.  05/22/18 06/01/18  [provider]  fentaNYL (DURAGESIC) 100 MCG/HR Place 1 patch onto the skin every 3 (three) days. 03/12/18   [provider]  folic acid (FOLVITE) 1 MG tablet Take 1 tablet by mouth daily. 04/10/18   [provider]  omeprazole (PRILOSEC) 20 MG  capsule Take by mouth. 07/05/15   [provider]  oxyCODONE-acetaminophen (PERCOCET) 10-325 MG tablet Take 1 tablet by mouth every 8 (eight) hours as needed for pain.  03/12/18   [provider]  vitamin [name]-12 (CYANOCOBALAMIN) 1000 MCG tablet Take 1 tablet by mouth daily.    [provider]    No family history on file.   Social History   Tobacco Use  . Smoking status: Never Smoker  . Smokeless tobacco: Never Used  Substance Use Topics  . Alcohol use: Never     Frequency: Never  . Drug use: Never    Allergies as of 07/29/2018 - Review Complete 06/05/2018  Allergen Reaction Noted  . Colesevelam Other (See Comments) 06/02/2018  . Lovastatin Other (See Comments) 06/02/2018  . Tetracyclines & related Rash 06/02/2018    Review of Systems:    All systems reviewed and negative except where noted in HPI.   Observations/Objective:  Labs: CBC    Component Value Date/Time   WBC 7.0 06/04/2018 0244   RBC 4.18 06/04/2018 0244   HGB 12.8 06/04/2018 0244   HCT 39.2 06/04/2018 0244   PLT 121 (L) 06/04/2018 0244   MCV 93.8 06/04/2018 0244   MCH 30.6 06/04/2018 0244   MCHC 32.7 06/04/2018 0244   RDW 13.2 06/04/2018 0244   LYMPHSABS 0.6 (L) 08/17/2006 0540   MONOABS 0.7 08/17/2006 0540   EOSABS 0.2 08/17/2006 0540   BASOSABS 0.0 08/17/2006 0540   CMP     Component Value Date/Time   NA 138 06/07/2018 0438   K 4.4 06/07/2018 0438   CL 109 06/07/2018 0438   CO2 24 06/07/2018 0438   GLUCOSE 131 (H) 06/07/2018 0438   BUN 16 06/07/2018 0438   CREATININE 0.51 06/07/2018 0438   CALCIUM 8.3 (L) 06/07/2018 0438   PROT 7.1 06/02/2018 2146   ALBUMIN 3.9 06/02/2018 2146   AST 20 06/02/2018 2146   ALT 18 06/02/2018 2146   ALKPHOS 65 06/02/2018 2146   BILITOT 0.9 06/02/2018 2146   GFRNONAA >60 06/07/2018 0438   GFRAA >60 06/07/2018 0438    Imaging Studies: No results found.  Assessment and Plan:   [name] is a 85 y.o. y/o female being called for follow-up after being discharged from the hospital with a gastric outlet obstruction.  The patient had a dilation at that time.  The patient states that she is doing well and not having any further nausea vomiting or bloating.  The patient was recommended to have a repeat dilation when she was in the hospital but since the patient has been doing so well without any symptoms she will not be set up for any repeat endoscopy at this time.  The patient has been encouraged to call me sooner than later  if her symptoms recur so that we can address them with a dilation if she needs it.  The patient is very happy with this plan and states she was not looking forward to having another procedure since she is feeling well.  She has been encouraged to increase her p.o. intake and to try and gain weight.  The patient states that she will contact me if any bloating or nausea and vomiting recur whereupon she will be set up for a repeat EGD with dilation.    Follow Up Instructions:  I discussed the assessment and treatment plan with the patient. The patient was provided an opportunity to ask questions and all were answered. The patient agreed with the plan  and demonstrated an understanding of the instructions.   The patient was advised to call back or seek an in-person evaluation if the symptoms worsen or if the condition fails to improve as anticipated.  I provided 15 minutes of non-face-to-face time during this encounter.   Brittany Miniumarren Raniya Golembeski, MD   Speech recognition software was used to dictate the above note.

## 2019-02-27 ENCOUNTER — Inpatient Hospital Stay
Admission: EM | Admit: 2019-02-27 | Discharge: 2019-03-02 | DRG: 392 | Disposition: A | Discharge status: Home Health | Payer: Medicare PPO | Attending: Internal Medicine | Admitting: Internal Medicine

## 2019-02-27 ENCOUNTER — Telehealth: Payer: Self-pay | Admitting: Gastroenterology

## 2019-02-27 ENCOUNTER — Other Ambulatory Visit: Payer: Self-pay

## 2019-02-27 ENCOUNTER — Emergency Department: Payer: Medicare PPO

## 2019-02-27 DIAGNOSIS — E7800 Pure hypercholesterolemia, unspecified: Secondary | ICD-10-CM | POA: Diagnosis present

## 2019-02-27 DIAGNOSIS — Z79899 Other long term (current) drug therapy: Secondary | ICD-10-CM | POA: Diagnosis not present

## 2019-02-27 DIAGNOSIS — G8929 Other chronic pain: Secondary | ICD-10-CM | POA: Diagnosis present

## 2019-02-27 DIAGNOSIS — E785 Hyperlipidemia, unspecified: Secondary | ICD-10-CM | POA: Diagnosis present

## 2019-02-27 DIAGNOSIS — K920 Hematemesis: Secondary | ICD-10-CM | POA: Diagnosis present

## 2019-02-27 DIAGNOSIS — E119 Type 2 diabetes mellitus without complications: Secondary | ICD-10-CM | POA: Diagnosis present

## 2019-02-27 DIAGNOSIS — K209 Esophagitis, unspecified without bleeding: Secondary | ICD-10-CM

## 2019-02-27 DIAGNOSIS — D62 Acute posthemorrhagic anemia: Secondary | ICD-10-CM

## 2019-02-27 DIAGNOSIS — Z20822 Contact with and (suspected) exposure to covid-19: Secondary | ICD-10-CM | POA: Diagnosis present

## 2019-02-27 DIAGNOSIS — I10 Essential (primary) hypertension: Secondary | ICD-10-CM | POA: Diagnosis present

## 2019-02-27 DIAGNOSIS — K9189 Other postprocedural complications and disorders of digestive system: Secondary | ICD-10-CM

## 2019-02-27 DIAGNOSIS — Z79891 Long term (current) use of opiate analgesic: Secondary | ICD-10-CM | POA: Diagnosis not present

## 2019-02-27 DIAGNOSIS — R579 Shock, unspecified: Secondary | ICD-10-CM | POA: Diagnosis present

## 2019-02-27 DIAGNOSIS — K922 Gastrointestinal hemorrhage, unspecified: Secondary | ICD-10-CM | POA: Diagnosis not present

## 2019-02-27 DIAGNOSIS — Z888 Allergy status to other drugs, medicaments and biological substances status: Secondary | ICD-10-CM

## 2019-02-27 DIAGNOSIS — K311 Adult hypertrophic pyloric stenosis: Secondary | ICD-10-CM | POA: Diagnosis present

## 2019-02-27 DIAGNOSIS — Z89412 Acquired absence of left great toe: Secondary | ICD-10-CM | POA: Diagnosis not present

## 2019-02-27 DIAGNOSIS — Z66 Do not resuscitate: Secondary | ICD-10-CM | POA: Diagnosis present

## 2019-02-27 DIAGNOSIS — Z96651 Presence of right artificial knee joint: Secondary | ICD-10-CM | POA: Diagnosis present

## 2019-02-27 DIAGNOSIS — K2100 Gastro-esophageal reflux disease with esophagitis, without bleeding: Secondary | ICD-10-CM | POA: Diagnosis present

## 2019-02-27 DIAGNOSIS — Z980 Intestinal bypass and anastomosis status: Secondary | ICD-10-CM

## 2019-02-27 LAB — CBC WITH DIFFERENTIAL/PLATELET
Abs Immature Granulocytes: 0.05 10*3/uL (ref 0.00–0.07)
Basophils Absolute: 0 10*3/uL (ref 0.0–0.1)
Basophils Relative: 0 %
Eosinophils Absolute: 0 10*3/uL (ref 0.0–0.5)
Eosinophils Relative: 0 %
HCT: 28.5 % — ABNORMAL LOW (ref 36.0–46.0)
Hemoglobin: 9.1 g/dL — ABNORMAL LOW (ref 12.0–15.0)
Immature Granulocytes: 1 %
Lymphocytes Relative: 10 %
Lymphs Abs: 0.7 10*3/uL (ref 0.7–4.0)
MCH: 31.3 pg (ref 26.0–34.0)
MCHC: 31.9 g/dL (ref 30.0–36.0)
MCV: 97.9 fL (ref 80.0–100.0)
Monocytes Absolute: 0.5 10*3/uL (ref 0.1–1.0)
Monocytes Relative: 8 %
Neutro Abs: 5.5 10*3/uL (ref 1.7–7.7)
Neutrophils Relative %: 81 %
Platelets: 96 10*3/uL — ABNORMAL LOW (ref 150–400)
RBC: 2.91 MIL/uL — ABNORMAL LOW (ref 3.87–5.11)
RDW: 12.7 % (ref 11.5–15.5)
WBC: 6.8 10*3/uL (ref 4.0–10.5)
nRBC: 0 % (ref 0.0–0.2)

## 2019-02-27 LAB — COMPREHENSIVE METABOLIC PANEL
ALT: 13 U/L (ref 0–44)
AST: 14 U/L — ABNORMAL LOW (ref 15–41)
Albumin: 2.5 g/dL — ABNORMAL LOW (ref 3.5–5.0)
Alkaline Phosphatase: 35 U/L — ABNORMAL LOW (ref 38–126)
Anion gap: 6 (ref 5–15)
BUN: 38 mg/dL — ABNORMAL HIGH (ref 8–23)
CO2: 21 mmol/L — ABNORMAL LOW (ref 22–32)
Calcium: 7.6 mg/dL — ABNORMAL LOW (ref 8.9–10.3)
Chloride: 113 mmol/L — ABNORMAL HIGH (ref 98–111)
Creatinine, Ser: 0.56 mg/dL (ref 0.44–1.00)
GFR calc Af Amer: >60 mL/min (ref >60)
GFR calc non Af Amer: >60 mL/min (ref >60)
Glucose, Bld: 168 mg/dL — ABNORMAL HIGH (ref 70–99)
Potassium: 4 mmol/L (ref 3.5–5.1)
Sodium: 140 mmol/L (ref 135–145)
Total Bilirubin: 0.6 mg/dL (ref 0.3–1.2)
Total Protein: 4.3 g/dL — ABNORMAL LOW (ref 6.5–8.1)

## 2019-02-27 LAB — PROTIME-INR
INR: 1.2 (ref 0.8–1.2)
Prothrombin Time: 15.4 seconds — ABNORMAL HIGH (ref 11.4–15.2)

## 2019-02-27 LAB — RESPIRATORY PANEL BY RT PCR (FLU A&B, COVID)
Influenza A by PCR: NEGATIVE
Influenza B by PCR: NEGATIVE
SARS Coronavirus 2 by RT PCR: NEGATIVE

## 2019-02-27 LAB — ETHANOL: Alcohol, Ethyl (B): <10 mg/dL (ref <10)

## 2019-02-27 LAB — LIPASE, BLOOD: Lipase: 16 U/L (ref 11–51)

## 2019-02-27 MED ORDER — PROMETHAZINE HCL 12.5 MG RE SUPP: 12.5000 mg | Freq: Four times a day (QID) | RECTAL | 0 refills | Status: DC | PRN

## 2019-02-27 MED ORDER — ONDANSETRON HCL 4 MG PO TABS: 4.0000 mg | ORAL_TABLET | Freq: Four times a day (QID) | ORAL | 1 refills | Status: DC | PRN

## 2019-02-27 MED ORDER — ACETAMINOPHEN 325 MG PO TABS: 650.0000 mg | ORAL_TABLET | Freq: Four times a day (QID) | ORAL | Status: DC | PRN

## 2019-02-27 MED ORDER — SODIUM CHLORIDE 0.9 % IV SOLN
10.0000 mL/h | Freq: Once | INTRAVENOUS | Status: AC
  Administered 2019-02-27: 10 mL/h via INTRAVENOUS

## 2019-02-27 MED ORDER — SODIUM CHLORIDE 0.9 % IV SOLN
8.0000 mg/h | INTRAVENOUS | Status: DC
  Administered 2019-02-28 – 2019-03-01 (×4): 8 mg/h via INTRAVENOUS
  Filled 2019-02-27 (×6): qty 80

## 2019-02-27 MED ORDER — SENNOSIDES-DOCUSATE SODIUM 8.6-50 MG PO TABS: 1.0000 | ORAL_TABLET | Freq: Every evening | ORAL | Status: DC | PRN

## 2019-02-27 MED ORDER — ONDANSETRON HCL 4 MG/2ML IJ SOLN: 4.0000 mg | Freq: Four times a day (QID) | INTRAMUSCULAR | Status: DC | PRN

## 2019-02-27 MED ORDER — ACETAMINOPHEN 650 MG RE SUPP: 650.0000 mg | Freq: Four times a day (QID) | RECTAL | Status: DC | PRN

## 2019-02-27 MED ORDER — PANTOPRAZOLE SODIUM 40 MG IV SOLR
40.0000 mg | Freq: Once | INTRAVENOUS | Status: AC
  Administered 2019-02-27: 40 mg via INTRAVENOUS
  Filled 2019-02-27: qty 40

## 2019-02-27 MED ORDER — ONDANSETRON HCL 4 MG/2ML IJ SOLN
4.0000 mg | Freq: Once | INTRAMUSCULAR | Status: AC
  Administered 2019-02-27: 4 mg via INTRAVENOUS
  Filled 2019-02-27: qty 2

## 2019-02-27 MED ORDER — PANTOPRAZOLE SODIUM 40 MG IV SOLR
40.0000 mg | Freq: Once | INTRAVENOUS | Status: DC
  Filled 2019-02-27: qty 40

## 2019-02-27 MED ORDER — SODIUM CHLORIDE 0.9 % IV SOLN: INTRAVENOUS | Status: DC

## 2019-02-27 MED ORDER — ONDANSETRON HCL 4 MG PO TABS: 4.0000 mg | ORAL_TABLET | Freq: Four times a day (QID) | ORAL | Status: DC | PRN

## 2019-02-27 MED ORDER — SODIUM CHLORIDE 0.9% IV SOLUTION
Freq: Once | INTRAVENOUS | Status: AC
  Filled 2019-02-27: qty 250

## 2019-02-27 NOTE — Telephone Encounter (Signed)
Kim from [facility] pharmacy left vm stating pt is still very nauseas and vomiting she wanted to check if we could add a suppository  With the Zofran per Daughters request  Selena Batten cb [phone]

## 2019-02-27 NOTE — Telephone Encounter (Signed)
Spoke with pt's daughter and was ok'd by Dr. Servando Snare to send some phenergan suppositories to her pharmacy.

## 2019-02-27 NOTE — ED Provider Notes (Addendum)
[facility] Emergency Department Provider Note  ____________________________________________  Time seen: Approximately 10:16 PM  I have reviewed the triage vital signs and the nursing notes.   HISTORY  Chief Complaint Emesis    HPI [name] is a 86 y.o. female with a history of hypertension GERD diabetes who comes the ED due to nausea and vomiting.  EMS report hematemesis on scene.  Patient had initial blood pressure of 80/50 with patient appearing somnolent, they gave 1000 mL of IV saline bolus with improvement of blood pressure to about 110/60 and normalization of mental status.   No history of GI bleed in the past, no blood thinners.  Patient complains of upper abdominal pain, no chest pain or shortness of breath.  Denies steroids or heavy NSAID use.     Past Medical History:  Diagnosis Date  . GERD (gastroesophageal reflux disease)   . High cholesterol   . Hypertension      Patient Active Problem List   Diagnosis Date Noted  . Acute upper GI bleeding 02/27/2019  . Acute blood loss anemia 02/27/2019  . History of bypass gastrojejunostomy 02/27/2019  . Arthritis 06/18/2018  . Diabetes mellitus type 2, uncomplicated (HCC) 06/18/2018  . Diabetic nephropathy (HCC) 06/18/2018  . Hemorrhoids 06/18/2018  . Hyperlipidemia 06/18/2018  . Hypertension 06/18/2018  . Obesity 06/18/2018  . Gastric outlet obstruction   . Stricture of bowel (HCC)   . Ileus (HCC) 06/02/2018  . Long term prescription opiate use 08/22/2017  . Lumbar spondylosis with myelopathy 08/22/2017  . Chronic pain 07/21/2013     Past Surgical History:  Procedure Laterality Date  . ABDOMINAL SURGERY    . BACK SURGERY    . CHOLECYSTECTOMY    . ESOPHAGOGASTRODUODENOSCOPY N/A 06/04/2018   Procedure: ESOPHAGOGASTRODUODENOSCOPY (EGD);  Surgeon: Pasty Spillers, MD;  Location: [facility] ENDOSCOPY;  Service: Endoscopy;  Laterality: N/A;  . ESOPHAGOGASTRODUODENOSCOPY N/A  06/05/2018   Procedure: ESOPHAGOGASTRODUODENOSCOPY (EGD);  Surgeon: Midge Minium, MD;  Location: [facility] ENDOSCOPY;  Service: Endoscopy;  Laterality: N/A;  use Fluoro bed, X-ray is aware  . REPLACEMENT TOTAL KNEE Right   . TOE AMPUTATION Left    great toe  . TONSILLECTOMY       Prior to Admission medications   Medication Sig Start Date End Date Taking? Authorizing Provider  amLODipine (NORVASC) 5 MG tablet Take 1 tablet by mouth daily. 01/05/16   [provider]  atorvastatin (LIPITOR) 20 MG tablet Take 1 tablet by mouth at bedtime. 09/12/16   [provider]  dicyclomine (BENTYL) 10 MG capsule Take 10 mg by mouth 3 (three) times daily before meals.  05/22/18 06/01/18  [provider]  fentaNYL (DURAGESIC) 100 MCG/HR Place 1 patch onto the skin every 3 (three) days. 03/12/18   [provider]  folic acid (FOLVITE) 1 MG tablet Take 1 tablet by mouth daily. 04/10/18   [provider]  omeprazole (PRILOSEC) 20 MG capsule Take by mouth. 07/05/15   [provider]  ondansetron (ZOFRAN) 4 MG tablet Take 1 tablet (4 mg total) by mouth every 6 (six) hours as needed for nausea or vomiting. 02/27/19   Midge Minium, MD  oxyCODONE-acetaminophen (PERCOCET) 10-325 MG tablet Take 1 tablet by mouth every 8 (eight) hours as needed for pain.  03/12/18   [provider]  promethazine (PHENERGAN) 12.5 MG suppository Place 1 suppository (12.5 mg total) rectally every 6 (six) hours as needed for nausea or vomiting. 02/27/19   Midge Minium, MD  vitamin  B-12 (CYANOCOBALAMIN) 1000 MCG tablet Take 1 tablet by mouth daily.    [provider]     Allergies Colesevelam, Lovastatin, and Tetracyclines & related   No family history on file.  Social History Social History   Tobacco Use  . Smoking status: Never Smoker  . Smokeless tobacco: Never Used  Substance Use Topics  . Alcohol use: Never  . Drug use: Never    Review of Systems  Constitutional:    No fever or chills.  ENT:   No sore throat. No rhinorrhea. Cardiovascular:   No chest pain or syncope. Respiratory:   No dyspnea or cough. Gastrointestinal:   Positive abdominal pain and vomiting as above. Musculoskeletal:   Negative for focal pain or swelling All other systems reviewed and are negative except as documented above in ROS and HPI.  ____________________________________________   PHYSICAL EXAM:  VITAL SIGNS: ED Triage Vitals  Enc Vitals Group     BP 02/27/19 2128 (!) 125/48     Pulse Rate 02/27/19 2128 90     Resp 02/27/19 2128 15     Temp 02/27/19 2116 (!) 97.5 F (36.4 C)     Temp Source 02/27/19 2116 Oral     SpO2 02/27/19 2128 96 %     Weight 02/27/19 2116 120 lb (54.4 kg)     Height 02/27/19 2116 5\' 5"  (1.651 m)     Head Circumference --      Peak Flow --      Pain Score 02/27/19 2116 7     Pain Loc --      Pain Edu? --      Excl. in [place]? --     Vital signs reviewed, nursing assessments reviewed.   Constitutional:   Alert and oriented.  Ill-appearing Eyes:   Conjunctivae are pale. EOMI. PERRL. ENT      Head:   Normocephalic and atraumatic.      Nose:   Wearing a mask.      Mouth/Throat:   Wearing a mask.      Neck:   No meningismus. Full ROM. Hematological/Lymphatic/Immunilogical:   No cervical lymphadenopathy. Cardiovascular:   RRR. Symmetric bilateral radial and DP pulses.  No murmurs. Cap refill less than 2 seconds. Respiratory:   Normal respiratory effort without tachypnea/retractions. Breath sounds are clear and equal bilaterally. No wheezes/rales/rhonchi. Gastrointestinal:   Soft with mild left upper quadrant tenderness. Non distended. There is no CVA tenderness.  No rebound, rigidity, or guarding.  Rectal exam shows melanotic black stool, Hemoccult positive.  There are external hemorrhoids that are nonthrombosed inflamed or bleeding.  Musculoskeletal:   Normal range of motion in all extremities. No joint effusions.  No lower extremity  tenderness.  No edema. Neurologic:   Normal speech and language.  Motor grossly intact. No acute focal neurologic deficits are appreciated.  Skin:    Skin is warm, dry and intact. No rash noted.  No petechiae, purpura, or bullae.  ____________________________________________    LABS (pertinent positives/negatives) (all labs ordered are listed, but only abnormal results are displayed) Labs Reviewed  COMPREHENSIVE METABOLIC PANEL - Abnormal; Notable for the following components:      Result Value   Chloride 113 (*)    CO2 21 (*)    Glucose, Bld 168 (*)    BUN 38 (*)    Calcium 7.6 (*)    Total Protein 4.3 (*)    Albumin 2.5 (*)    AST 14 (*)    Alkaline Phosphatase 35 (*)  All other components within normal limits  PROTIME-INR - Abnormal; Notable for the following components:   Prothrombin Time 15.4 (*)    All other components within normal limits  CBC WITH DIFFERENTIAL/PLATELET - Abnormal; Notable for the following components:   RBC 2.91 (*)    Hemoglobin 9.1 (*)    HCT 28.5 (*)    Platelets 96 (*)    All other components within normal limits  RESPIRATORY PANEL BY RT PCR (FLU A&B, COVID)  LIPASE, BLOOD  ETHANOL  POC OCCULT BLOOD, ED  TYPE AND SCREEN  PREPARE RBC (CROSSMATCH)   ____________________________________________   EKG  Interpreted by me Sinus rhythm rate of 94, left axis, poor R wave progression.  Normal ST segments and T waves.  No ischemic changes.  ____________________________________________    RADIOLOGY  DG Chest Portable 1 View  Result Date: 02/27/2019 CLINICAL DATA:  86 year old female with nausea vomiting. EXAM: PORTABLE CHEST 1 VIEW COMPARISON:  Chest radiograph dated 08/03/2006. FINDINGS: Minimal bibasilar atelectasis. No focal consolidation, pleural effusion, pneumothorax. Stable cardiac silhouette. Atherosclerotic calcification of the aorta. No acute osseous pathology. IMPRESSION: No active cardiopulmonary disease. Electronically Signed    By: Elgie Collard M.D.   On: 02/27/2019 21:47    ____________________________________________   PROCEDURES .Critical Care Performed by: Sharman Cheek, MD Authorized by: Sharman Cheek, MD   Critical care provider statement:    Critical care time (minutes):  35   Critical care time was exclusive of:  Separately billable procedures and treating other patients   Critical care was necessary to treat or prevent imminent or life-threatening deterioration of the following conditions:  Shock and circulatory failure   Critical care was time spent personally by me on the following activities:  Development of treatment plan with patient or surrogate, discussions with consultants, evaluation of patient's response to treatment, examination of patient, obtaining history from patient or surrogate, ordering and performing treatments and interventions, ordering and review of laboratory studies, ordering and review of radiographic studies, pulse oximetry, re-evaluation of patient's condition and review of old charts    ____________________________________________    CLINICAL IMPRESSION / ASSESSMENT AND PLAN / ED COURSE  Medications ordered in the ED: Medications  0.9 %  sodium chloride infusion (has no administration in time range)  ondansetron (ZOFRAN) injection 4 mg (4 mg Intravenous Given 02/27/19 2133)  pantoprazole (PROTONIX) injection 40 mg (40 mg Intravenous Given 02/27/19 2135)    Pertinent labs & imaging results that were available during my care of the patient were reviewed by me and considered in my medical decision making (see chart for details).  [name] was evaluated in Emergency Department on 02/27/2019 for the symptoms described in the history of present illness. She was evaluated in the context of the global COVID-19 pandemic, which necessitated consideration that the patient might be at risk for infection with the SARS-CoV-2 virus that causes COVID-19. Institutional  protocols and algorithms that pertain to the evaluation of patients at risk for COVID-19 are in a state of rapid change based on information released by regulatory bodies including the CDC and federal and state organizations. These policies and algorithms were followed during the patient's care in the ED.   Patient presents with clinically apparent upper GI bleed due to hematemesis, melanotic Hemoccult positive stool.  Vital signs are normal without tachycardia or hypotension.  She is not on beta-blockers that might vascular compensatory tachycardia.  INR is normal, hemoglobin is 9 which appears to be decreased from a baseline of 13,  but without signs of shock including normal mental status, will defer on transfusion at this time.  However if she starts having copious melanotic output or changes in mental status or vital signs, will need to proceed with transfusion right away.  Plan to admit to hospital.  ----------------------------------------- 10:21 PM on 02/27/2019 ----------------------------------------- Now hypotensive.  Blood pressure decreased to about 90/40.  Patient appears more somnolent as well.  I will start blood transfusion emergently, 2 units.  Case discussed with the hospitalist.  Will contact GI as well.   ----------------------------------------- 10:43 PM on 02/27/2019 ----------------------------------------- Discussed with Dr. Maximino Greenland who agrees with current plan of transfusion and IV Protonix and hospitalist admission.  She will plan to do EGD tomorrow unless there is pronounced decompensation overnight necessitating more urgent intervention.       ____________________________________________   FINAL CLINICAL IMPRESSION(S) / ED DIAGNOSES    Final diagnoses:  Acute upper GI bleed  Shock [facility])     ED Discharge Orders    None      Portions of this note were generated with dragon dictation software. Dictation errors may occur despite best attempts at  proofreading.   Sharman Cheek, MD 02/27/19 2232    Sharman Cheek, MD 02/27/19 2245

## 2019-02-27 NOTE — ED Notes (Signed)
Pt appears lethargic. Responds to verbal stimuli. Alert and oriented

## 2019-02-27 NOTE — H&P (Signed)
History and Physical    [name] ZOX:[id number] DOB: 28-Aug-1932 DOA: 02/27/2019  PCP: Marguarite Arbour, MD   Patient coming from: home, where she lives with daughter  I have personally briefly reviewed patient's old medical records in [facility]  Chief Complaint: vomiting blood and black stool, lightheadedness  HPI: [name] is a 86 y.o. female with medical history significant for hypertension, chronic pain on chronic narcotics as well as history of gastrojejunostomy with gastric outlet obstruction status post dilatation on 07/29/2018, who was in her usual state of health until earlier in the day of arrival when she had a large bloody emesis.  She went on to have 4-5 melanotic stools during the day and according to daughter who gives most of the history, she slumped and almost passed out when she was trying to get off the commode but she was able to help her sit down and she did not fall.  By arrival in the emergency room she was awake and alert. ED Course: On arrival in the emergency room blood pressure was 125/48 with heart rate of 90.  She was afebrile with O2 sat 96% on room air.  While in the ER her blood pressure dropped to 94/41 improving with IV fluid boluses and is 101/59 at the time of admission.  Hemoglobin was 9.1 down from 13 a few months prior.  Cell count was normal.  Chemistries were normal.  Serum alcohol was less than 10, lipase 16.  INR 1.2.  Covid negative.  X-ray showed no active disease EKG showed no acute ST-T wave changes.  Patient was started on IV Protonix and typed and crossed for 2 units of packed red blood cells.  The emergency room provider spoke with Dr. Maximino Greenland who advised that the present time no need for urgent endoscopy but if condition should deteriorate, she will need to be placed in the ICU and intubated to have bedside EGD.  Hospitalist consulted for admission.  Review of Systems: As per HPI otherwise 10 point review of systems negative.   Most questions asked daughter at bedside patient was drowsy though easily arousable   Past Medical History:  Diagnosis Date  . GERD (gastroesophageal reflux disease)   . High cholesterol   . Hypertension     Past Surgical History:  Procedure Laterality Date  . ABDOMINAL SURGERY    . BACK SURGERY    . CHOLECYSTECTOMY    . ESOPHAGOGASTRODUODENOSCOPY N/A 06/04/2018   Procedure: ESOPHAGOGASTRODUODENOSCOPY (EGD);  Surgeon: Pasty Spillers, MD;  Location[facility] ENDOSCOPY;  Service: Endoscopy;  Laterality: N/A;  . ESOPHAGOGASTRODUODENOSCOPY N/A 06/05/2018   Procedure: ESOPHAGOGASTRODUODENOSCOPY (EGD);  Surgeon: Midge Minium, MD;  Location: [facility] ENDOSCOPY;  Service: Endoscopy;  Laterality: N/A;  use Fluoro bed, X-ray is aware  . REPLACEMENT TOTAL KNEE Right   . TOE AMPUTATION Left    great toe  . TONSILLECTOMY       reports that she has never smoked. She has never used smokeless tobacco. She reports that she does not drink alcohol or use drugs.  Allergies  Allergen Reactions  . Colesevelam Other (See Comments)  . Lovastatin Other (See Comments)  . Tetracyclines & Related Rash    No family history on file.   Prior to Admission medications   Medication Sig Start Date End Date Taking? Authorizing Provider  fentaNYL (DURAGESIC) 100 MCG/HR Place 1 patch onto the skin every 3 (three) days. 03/12/18  Yes [provider]  amLODipine (NORVASC) 5 MG tablet  Take 1 tablet by mouth daily. 01/05/16   [provider]  atorvastatin (LIPITOR) 20 MG tablet Take 1 tablet by mouth at bedtime. 09/12/16   [provider]  dicyclomine (BENTYL) 10 MG capsule Take 10 mg by mouth 3 (three) times daily before meals.  05/22/18 06/01/18  [provider]  folic acid (FOLVITE) 1 MG tablet Take 1 tablet by mouth daily. 04/10/18   [provider]  omeprazole (PRILOSEC) 20 MG capsule Take by mouth. 07/05/15   [provider]  ondansetron (ZOFRAN) 4 MG tablet Take 1  tablet (4 mg total) by mouth every 6 (six) hours as needed for nausea or vomiting. 02/27/19   Lucilla Lame, MD  oxyCODONE-acetaminophen (PERCOCET) 10-325 MG tablet Take 1 tablet by mouth every 8 (eight) hours as needed for pain.  03/12/18   [provider]  promethazine (PHENERGAN) 12.5 MG suppository Place 1 suppository (12.5 mg total) rectally every 6 (six) hours as needed for nausea or vomiting. 02/27/19   Lucilla Lame, MD  vitamin B-12 (CYANOCOBALAMIN) 1000 MCG tablet Take 1 tablet by mouth daily.    [provider]    Physical Exam: Vitals:   02/27/19 2225 02/27/19 2230 02/27/19 2235 02/27/19 2254  BP: (!) 94/41 (!) 94/46 (!) 115/56 (!) 101/59  Pulse: 78 88 94 93  Resp:  16 12 13   Temp:   97.6 F (36.4 C) 97.8 F (36.6 C)  TempSrc:   Oral Oral  SpO2: 97% 99% 100% 100%  Weight:      Height:         Vitals:   02/27/19 2225 02/27/19 2230 02/27/19 2235 02/27/19 2254  BP: (!) 94/41 (!) 94/46 (!) 115/56 (!) 101/59  Pulse: 78 88 94 93  Resp:  16 12 13   Temp:   97.6 F (36.4 C) 97.8 F (36.6 C)  TempSrc:   Oral Oral  SpO2: 97% 99% 100% 100%  Weight:      Height:        Constitutional: NAD, somewhat lethargic but easily arousable oriented x3 Eyes: PERRL, lids and conjunctivae normal ENMT: Mucous membranes are moist.  Neck: normal, supple, no masses, no thyromegaly Respiratory: clear to auscultation bilaterally, no wheezing, no crackles. Normal respiratory effort. No accessory muscle use.  Cardiovascular: Regular rate and rhythm, no murmurs / rubs / gallops. No extremity edema. 2+ pedal pulses. No carotid bruits.  Abdomen: no tenderness, no masses palpated. No hepatosplenomegaly. Bowel sounds positive.  Musculoskeletal: no clubbing / cyanosis. No joint deformity upper and lower extremities.  Skin: no rashes, lesions, ulcers.  Neurologic: No gross focal neurologic deficit.    Labs on Admission: I have personally reviewed following labs and imaging  studies  CBC: Recent Labs  Lab 02/27/19 2114  WBC 6.8  NEUTROABS 5.5  HGB 9.1*  HCT 28.5*  MCV 97.9  PLT 96*   Basic Metabolic Panel: Recent Labs  Lab 02/27/19 2114  NA 140  K 4.0  CL 113*  CO2 21*  GLUCOSE 168*  BUN 38*  CREATININE 0.56  CALCIUM 7.6*   GFR: Estimated Creatinine Clearance: 45 mL/min (by C-G formula based on SCr of 0.56 mg/dL). Liver Function Tests: Recent Labs  Lab 02/27/19 2114  AST 14*  ALT 13  ALKPHOS 35*  BILITOT 0.6  PROT 4.3*  ALBUMIN 2.5*   Recent Labs  Lab 02/27/19 2114  LIPASE 16   No results for input(s): AMMONIA in the last 168 hours. Coagulation Profile: Recent Labs  Lab 02/27/19 2114  INR 1.2   Cardiac Enzymes: No results for input(s): CKTOTAL, CKMB, CKMBINDEX, TROPONINI in the last 168 hours. BNP (last 3 results) No results for input(s): PROBNP in the last 8760 hours. HbA1C: No results for input(s): HGBA1C in the last 72 hours. CBG: No results for input(s): GLUCAP in the last 168 hours. Lipid Profile: No results for input(s): CHOL, HDL, LDLCALC, TRIG, CHOLHDL, LDLDIRECT in the last 72 hours. Thyroid Function Tests: No results for input(s): TSH, T4TOTAL, FREET4, T3FREE, THYROIDAB in the last 72 hours. Anemia Panel: No results for input(s): VITAMINB12, FOLATE, FERRITIN, TIBC, IRON, RETICCTPCT in the last 72 hours. Urine analysis:    Component Value Date/Time   COLORURINE YELLOW (A) 06/02/2018 2344   APPEARANCEUR HAZY (A) 06/02/2018 2344   LABSPEC 1.042 (H) 06/02/2018 2344   PHURINE 7.0 06/02/2018 2344   GLUCOSEU NEGATIVE 06/02/2018 2344   HGBUR MODERATE (A) 06/02/2018 2344   BILIRUBINUR NEGATIVE 06/02/2018 2344   KETONESUR 5 (A) 06/02/2018 2344   PROTEINUR 30 (A) 06/02/2018 2344   UROBILINOGEN 0.2 08/03/2006 1158   NITRITE NEGATIVE 06/02/2018 2344   LEUKOCYTESUR NEGATIVE 06/02/2018 2344    Radiological Exams on Admission: DG Chest Portable 1 View  Result Date: 02/27/2019 CLINICAL DATA:  86 year old female  with nausea vomiting. EXAM: PORTABLE CHEST 1 VIEW COMPARISON:  Chest radiograph dated 08/03/2006. FINDINGS: Minimal bibasilar atelectasis. No focal consolidation, pleural effusion, pneumothorax. Stable cardiac silhouette. Atherosclerotic calcification of the aorta. No acute osseous pathology. IMPRESSION: No active cardiopulmonary disease. Electronically Signed   By: Elgie Collard M.D.   On: 02/27/2019 21:47    EKG: Independently reviewed.   Assessment/Plan    Acute upper GI bleeding   Acute blood loss anemia Symptomatic anemia --Admit to stepdown --IV Protonix infusion -Close hemodynamic monitoring -Transfuse 2 units packed red blood cells -Posttransfusion H&H -If continued active bleeding, will type and cross an additional 2 units -GI consult done to Dr. Maximino Greenland --Per recommendations, if patient becomes hemodynamically unstable during the night will transfer to the ICU and intubate in order to facilitate bedside upper endoscopy    Gastric outlet obstruction with history of gastrojejunostomy history of dilatation in June 2020 -GI consult as above    Hypertension -Hold antihypertensives in view of potential unrelated to GI bleeding    Long term prescription opiate use -Patient has a fentanyl patch        DVT prophylaxis: scd  Code Status: dnr  Family Communication: Lowry Bowl, daughter  Disposition Plan: Back to previous home environment Consults called: Dr. Hoy Finlay, GI    Andris Baumann MD [facility]     02/27/2019, 11:30 PM

## 2019-02-27 NOTE — ED Triage Notes (Signed)
Pt to EMS via home for cchief complaint of N/V that started today. EMS reports pt vomiting large amounts of red colored vomit. EMS reports syncope, BP of 100/54. EMS given NS.  Pt in NAD at this time.

## 2019-02-27 NOTE — ED Notes (Signed)
BP 102/38. Pt lethargic. EDP at bedside. Verbal order 500 NS bolus.  Daughter at bedside Verbal order to start preparing for blood transfusion

## 2019-02-27 NOTE — ED Notes (Signed)
Pt noted to have fentanyl patch on upper right chest 100 mcg. Pt states she put in on yesterday for back pain

## 2019-02-28 ENCOUNTER — Inpatient Hospital Stay: Payer: Medicare PPO | Admitting: Anesthesiology

## 2019-02-28 ENCOUNTER — Inpatient Hospital Stay: Payer: Medicare PPO

## 2019-02-28 ENCOUNTER — Encounter: Payer: Self-pay | Admitting: Internal Medicine

## 2019-02-28 ENCOUNTER — Encounter
Admission: EM | Disposition: A | Discharge status: Home Health | Payer: Self-pay | Source: Home / Self Care | Attending: Internal Medicine

## 2019-02-28 DIAGNOSIS — K9189 Other postprocedural complications and disorders of digestive system: Secondary | ICD-10-CM

## 2019-02-28 DIAGNOSIS — K209 Esophagitis, unspecified without bleeding: Secondary | ICD-10-CM

## 2019-02-28 DIAGNOSIS — K922 Gastrointestinal hemorrhage, unspecified: Secondary | ICD-10-CM

## 2019-02-28 HISTORY — PX: ESOPHAGOGASTRODUODENOSCOPY: SHX5428

## 2019-02-28 LAB — TYPE AND SCREEN
ABO/RH(D): A NEG
Antibody Screen: NEGATIVE
Unit division: 0
Unit division: 0
Unit division: 0
Unit division: 0

## 2019-02-28 LAB — PREPARE RBC (CROSSMATCH)

## 2019-02-28 LAB — BPAM RBC
Blood Product Expiration Date: 202101192359
Blood Product Expiration Date: 202101222359
Blood Product Expiration Date: 202101222359
Blood Product Expiration Date: 202101242359
ISSUE DATE / TIME: 202101072229
ISSUE DATE / TIME: 202101072229
Unit Type and Rh: 600
Unit Type and Rh: 600
Unit Type and Rh: 9500
Unit Type and Rh: 9500

## 2019-02-28 LAB — GLUCOSE, CAPILLARY: Glucose-Capillary: 73 mg/dL (ref 70–99)

## 2019-02-28 LAB — HEMOGLOBIN AND HEMATOCRIT, BLOOD
HCT: 39.1 % (ref 36.0–46.0)
Hemoglobin: 12.6 g/dL (ref 12.0–15.0)

## 2019-02-28 LAB — PROTIME-INR
INR: 1.1 (ref 0.8–1.2)
Prothrombin Time: 14.4 seconds (ref 11.4–15.2)

## 2019-02-28 LAB — MRSA PCR SCREENING: MRSA by PCR: NEGATIVE

## 2019-02-28 LAB — ABO/RH: ABO/RH(D): A NEG

## 2019-02-28 SURGERY — EGD (ESOPHAGOGASTRODUODENOSCOPY)
Anesthesia: General

## 2019-02-28 MED ORDER — PROPOFOL 500 MG/50ML IV EMUL
INTRAVENOUS | Status: DC | PRN
  Administered 2019-02-28: 125 ug/kg/min via INTRAVENOUS

## 2019-02-28 MED ORDER — SODIUM CHLORIDE 0.9 % IV BOLUS
500.0000 mL | Freq: Once | INTRAVENOUS | Status: AC
  Administered 2019-02-28: 500 mL via INTRAVENOUS

## 2019-02-28 MED ORDER — IOHEXOL 300 MG/ML  SOLN: 100.0000 mL | Freq: Once | INTRAMUSCULAR | Status: DC | PRN

## 2019-02-28 MED ORDER — SODIUM CHLORIDE 0.9% IV SOLUTION
Freq: Once | INTRAVENOUS | Status: DC
  Filled 2019-02-28: qty 250

## 2019-02-28 MED ORDER — PROPOFOL 10 MG/ML IV BOLUS
INTRAVENOUS | Status: DC | PRN
  Administered 2019-02-28 (×2): 10 mg via INTRAVENOUS
  Administered 2019-02-28: 40 mg via INTRAVENOUS

## 2019-02-28 MED ORDER — CHLORHEXIDINE GLUCONATE CLOTH 2 % EX PADS
6.0000 | MEDICATED_PAD | Freq: Every day | CUTANEOUS | Status: DC
  Administered 2019-02-28 – 2019-03-02 (×3): 6 via TOPICAL

## 2019-02-28 MED ORDER — SODIUM CHLORIDE 0.9 % IV SOLN: INTRAVENOUS | Status: DC

## 2019-02-28 MED ORDER — LIDOCAINE HCL (PF) 2 % IJ SOLN
INTRAMUSCULAR | Status: DC | PRN
  Administered 2019-02-28: 80 mg via INTRADERMAL

## 2019-02-28 MED ORDER — PHENYLEPHRINE HCL (PRESSORS) 10 MG/ML IV SOLN
INTRAVENOUS | Status: DC | PRN
  Administered 2019-02-28 (×3): 100 ug via INTRAVENOUS

## 2019-02-28 MED ORDER — PROPOFOL 500 MG/50ML IV EMUL
INTRAVENOUS | Status: AC
  Filled 2019-02-28: qty 50

## 2019-02-28 NOTE — ED Notes (Signed)
Pt BP 90's/40's with HR in the 70's. MD Para March notified. 500NS bolus ordered.

## 2019-02-28 NOTE — TOC Transition Note (Signed)
Dr.Tahaliani in to talk with patient.She is to be consulated with surgeon for pyloric stenosis.Pt.had 1 sip of ginger ale before the post op note stated she should be NPO.This was about 1;30.Pt.is NPO since then.Report called to ICU 2 and pt.is transported to unit in stable condition.

## 2019-02-28 NOTE — ED Notes (Signed)
Resumed care from [place], [place]. Pt resting comfortably with daughter at bedside. Pt denies pain or other needs at this time. NS and protonix infusing at this time.

## 2019-02-28 NOTE — Op Note (Addendum)
[facility] Gastroenterology Patient Name: [name] Procedure Date: 02/28/2019 11:46 AM MRN: [id number] Account #: [id number] Date of Birth: Jun 28, 1932 Admit Type: Inpatient Age: 86 Room: [facility] ENDO ROOM 3 Gender: Female Note Status: Finalized Procedure:             Upper GI endoscopy Indications:           Coffee-ground emesis Providers:             Randie Tallarico B. Maximino Greenland MD, MD Medicines:             Monitored Anesthesia Care Complications:         No immediate complications. Procedure:             Pre-Anesthesia Assessment:                        - The risks and benefits of the procedure and the                         sedation options and risks were discussed with the                         patient. All questions were answered and informed                         consent was obtained.                        - Patient identification and proposed procedure were                         verified prior to the procedure.                        - ASA Grade Assessment: III - A patient with severe                         systemic disease.                        After obtaining informed consent, the endoscope was                         passed under direct vision. Throughout the procedure,                         the patient's blood pressure, pulse, and oxygen                         saturations were monitored continuously. The Endoscope                         was introduced through the mouth, and advanced to the                         jejunum. The upper GI endoscopy was accomplished with                         ease. The patient tolerated the procedure well. Findings:      Esophagitis with no bleeding was found in  the distal esophagus.      A large amount of food (residue) was found in the gastric fundus. This       was partially cleared but could not be completely cleared due to       repeated clogging of the suction channel. Liquid black material was seen    but no red blood was present.      A severe stenosis was found at the pylorus. This was traversed. A TTS       dilator was passed through the scope. Dilation with a 12-13.5-15 mm       pyloric balloon dilator was performed. A TTS dilator was passed through       the scope. Dilation with a 15 mm pyloric balloon dilator was performed.       The stenosis was able to traversed before dilation and was dilated to       74mm. The area just distal to the stenosis appeared normal, but the       short stenosis site was not able to be carefully visualized to obtain       biopsies.      The examined jejunum was normal. Impression:            - Esophagitis with no bleeding.                        - A large amount of food (residue) in the stomach.                        - Post-surgical deformity in the pylorus.                        - Gastric stenosis was found at the pylorus. Dilated.                        - Normal examined jejunum.                        - No specimens collected.                        - Patient's symptoms of emesis are from the gastric                         outlet obstruction caused by the pyloric stenosis.                         hematin was present in the stomach but the absence of                         red blood for the duration of the entire exam is                         suggestive of no active ongoing bleeding and the                         emesis being from the food and liquid accumulated in                         the stomach overtime. Recommendation:        -  Refer to a surgeon today.                        - Patient has had dilation of the site for this                         pyloric stenosis in April 2020 and has recurrent                         stenosis. Due to the severe stenosis and recurrence of                         the same, the dilation done today is only a temporary                         measure and is by no means curative of the gastric                          outlet obstruction being caused by recurrent stenosis                         of this area. This needs further evaluation by surgery.                        - Since I was able to traverse the site even prior to                         the dilation, and was able to dilate to 32mm, and able                         to see a wider lumen with the dilator balloon before                         dilation (Image 14) suggests that the stenosis is not                         just from narrowing itself, but rather also from sharp                         angulation or the anatomy at the site. Repeat                         dilations at the site are therefore not likely to be                         curative and if surgery does not recommend surgical                         intervention, futher evaluation at [facility] or [facility] can be                         considered for stenting at the site.                        - NPO until further evaluation by surgery.                        -  Follow an antireflux regimen.                        - Use Protonix (pantoprazole) 40 mg IV BID.                        - The findings and recommendations were discussed with                         the patient.                        - The findings and recommendations were discussed with                         the patient's family. Procedure Code(s):     --- Professional ---                        [phone], Esophagogastroduodenoscopy, flexible,                         transoral; with dilation of gastric/duodenal                         stricture(s) (eg, balloon, bougie) Diagnosis Code(s):     --- Professional ---                        K20.90, Esophagitis, unspecified without bleeding                        K91.89, Other postprocedural complications and                         disorders of digestive system                        K31.1, Adult hypertrophic pyloric stenosis                        K92.0, Hematemesis CPT  copyright 2019 American Medical Association. All rights reserved. The codes documented in this report are preliminary and upon coder review may  be revised to meet current compliance requirements.  Vonda Antigua, MD Margretta Sidle B. Bonna Gains MD, MD 02/28/2019 1:17:40 PM This report has been signed electronically. Number of Addenda: 0 Note Initiated On: 02/28/2019 11:46 AM Estimated Blood Loss:  Estimated blood loss: none.      [facility]

## 2019-02-28 NOTE — Consult Note (Signed)
Vonda Antigua, MD [address], [place], [place], [place], [address] [address], [place], [place], [place], [address] Phone: [phone]  Fax: [phone]  Consultation  Referring Provider:     Dr. Kurtis Bushman Primary Care Physician:  Idelle Crouch, MD Reason for Consultation:     GI bleed  Date of Admission:  02/27/2019 Date of Consultation:  02/28/2019         HPI:   [name] is a 86 y.o. female presents with 2-3 episodes of emesis at home yesterday.  Daughter provides history along with the patient and states she noted 1 episode of black-looking emesis, followed by dark brown emesis after that.  Also patient had dark brown-looking stool.  No red blood in either output.  Patient had weakness and near syncope and was brought into the ER.  Patient was hypotensive on presentation, and responded well to resuscitation.  Hemoglobin was around 9 at its lowest since presentation  Patient has previous history of gastrojejunostomy and pyloric stenosis requiring dilation in April 2020  Past Medical History:  Diagnosis Date  . GERD (gastroesophageal reflux disease)   . High cholesterol   . Hypertension     Past Surgical History:  Procedure Laterality Date  . ABDOMINAL SURGERY    . BACK SURGERY    . CHOLECYSTECTOMY    . ESOPHAGOGASTRODUODENOSCOPY N/A 06/04/2018   Procedure: ESOPHAGOGASTRODUODENOSCOPY (EGD);  Surgeon: Virgel Manifold, MD;  Location: [facility] ENDOSCOPY;  Service: Endoscopy;  Laterality: N/A;  . ESOPHAGOGASTRODUODENOSCOPY N/A 06/05/2018   Procedure: ESOPHAGOGASTRODUODENOSCOPY (EGD);  Surgeon: Lucilla Lame, MD;  Location: [facility] ENDOSCOPY;  Service: Endoscopy;  Laterality: N/A;  use Fluoro bed, X-ray is aware  . JOINT REPLACEMENT    . REPLACEMENT TOTAL KNEE Right   . TOE AMPUTATION Left    great toe  . TONSILLECTOMY      Prior to Admission medications   Medication Sig Start Date End Date Taking? Authorizing Provider  amLODipine (NORVASC) 5 MG tablet Take 5  mg by mouth daily.  01/05/16  Yes [provider]  atorvastatin (LIPITOR) 20 MG tablet Take 20 mg by mouth at bedtime.  09/12/16  Yes [provider]  fentaNYL (DURAGESIC) 100 MCG/HR Place 1 patch onto the skin every 3 (three) days. 03/12/18  Yes [provider]  folic acid (FOLVITE) 1 MG tablet Take 1 mg by mouth daily.  04/10/18  Yes [provider]  omeprazole (PRILOSEC) 20 MG capsule Take 20 mg by mouth 2 (two) times daily.  07/05/15  Yes [provider]  ondansetron (ZOFRAN) 4 MG tablet Take 1 tablet (4 mg total) by mouth every 6 (six) hours as needed for nausea or vomiting. 02/27/19  Yes Lucilla Lame, MD  promethazine (PHENERGAN) 12.5 MG suppository Place 1 suppository (12.5 mg total) rectally every 6 (six) hours as needed for nausea or vomiting. 02/27/19  Yes Lucilla Lame, MD    History reviewed. No pertinent family history.   Social History   Tobacco Use  . Smoking status: Never Smoker  . Smokeless tobacco: Never Used  Substance Use Topics  . Alcohol use: Never  . Drug use: Never    Allergies as of 02/27/2019 - Review Complete 02/27/2019  Allergen Reaction Noted  . Colesevelam Other (See Comments) 06/02/2018  . Lovastatin Other (See Comments) 06/02/2018  . Tetracyclines & related Rash 06/02/2018    Review of Systems:    All systems reviewed and negative except where noted in HPI.   Physical Exam:  Vital signs in last 24 hours: Vitals:   02/28/19 0830 02/28/19 0900 02/28/19 1000 02/28/19 1109  BP: (!) 115/55 (!) 110/53 133/64 (!) 135/53  Pulse: 67 64 69 80  Resp: (!) 8 (!) 8 (!) 8 16  Temp:    (!) 97.3 F (36.3 C)  TempSrc:    Temporal  SpO2: 97% 99% 100% 97%  Weight:    54.4 kg  Height:    5\' 5"  (1.651 m)     General:   Pleasant, cooperative in NAD Head:  Normocephalic and atraumatic. Eyes:   No icterus.   Conjunctiva pink. PERRLA. Ears:  Normal auditory acuity. Neck:  Supple; no masses or thyroidomegaly Lungs:  Respirations even and unlabored. Lungs clear to auscultation bilaterally.   No wheezes, crackles, or rhonchi.  Abdomen:  Soft, nondistended, nontender. Normal bowel sounds. No appreciable masses or hepatomegaly.  No rebound or guarding.  Neurologic:  Alert and oriented x3;  grossly normal neurologically. Skin:  Intact without significant lesions or rashes. Cervical Nodes:  No significant cervical adenopathy. Psych:  Alert and cooperative. Normal affect.  LAB RESULTS: Recent Labs    02/27/19 2114 02/28/19 0129  WBC 6.8  --   HGB 9.1* 12.6  HCT 28.5* 39.1  PLT 96*  --    BMET Recent Labs    02/27/19 2114  NA 140  K 4.0  CL 113*  CO2 21*  GLUCOSE 168*  BUN 38*  CREATININE 0.56  CALCIUM 7.6*   LFT Recent Labs    02/27/19 2114  PROT 4.3*  ALBUMIN 2.5*  AST 14*  ALT 13  ALKPHOS 35*  BILITOT 0.6   PT/INR Recent Labs    02/27/19 2114 02/28/19 0129  LABPROT 15.4* 14.4  INR 1.2 1.1    STUDIES: DG Chest Portable 1 View  Result Date: 02/27/2019 CLINICAL DATA:  86 year old female with nausea vomiting. EXAM: PORTABLE CHEST 1 VIEW COMPARISON:  Chest radiograph dated 08/03/2006. FINDINGS: Minimal bibasilar atelectasis. No focal consolidation, pleural effusion, pneumothorax. Stable cardiac silhouette. Atherosclerotic calcification of the aorta. No acute osseous pathology. IMPRESSION: No active cardiopulmonary disease. Electronically Signed   By: 08/05/2006 M.D.   On: 02/27/2019 21:47      Impression / Plan:   [name] is a 86 y.o. y/o female with previous history of gastrojejunostomy, and recent pyloric stenosis seen on upper endoscopy requiring dilation, here with multiple episodes of emesis  Proceed with EGD today for further evaluation of possible upper GI bleed versus gastric outlet obstruction from previous pyloric stenosis  PPI IV twice daily  Continue serial CBCs and transfuse PRN Avoid NSAIDs Maintain 2 large-bore IV lines Please page GI with any  acute hemodynamic changes, or signs of active GI bleeding  See endoscopy report post procedure for further findings and recommendations  Thank you for involving me in the care of this patient.      LOS: 1 day   97, MD  02/28/2019, 11:58 AM

## 2019-02-28 NOTE — ED Notes (Signed)
Pt sleeping comfortably at this time. Daughter at bedside

## 2019-02-28 NOTE — ED Notes (Signed)
Pt resting comfortably.  Daughter at bedside.

## 2019-02-28 NOTE — Transfer of Care (Signed)
Immediate Anesthesia Transfer of Care Note  Patient: [name]  Procedure(s) Performed: ESOPHAGOGASTRODUODENOSCOPY (EGD) (N/A )  Patient Location: PACU  Anesthesia Type:General  Level of Consciousness: sedated  Airway & Oxygen Therapy: Patient Spontanous Breathing and Patient connected to nasal cannula oxygen  Post-op Assessment: Report given to RN and Post -op Vital signs reviewed and stable  Post vital signs: Reviewed and stable  Last Vitals:  Vitals Value Taken Time  BP    Temp    Pulse    Resp    SpO2      Last Pain:  Vitals:   02/28/19 1109  TempSrc: Temporal  PainSc: 0-No pain         Complications: No apparent anesthesia complications

## 2019-02-28 NOTE — Anesthesia Procedure Notes (Signed)
Date/Time: 02/28/2019 12:05 PM Performed by: Junious Silk, CRNA Pre-anesthesia Checklist: Patient identified, Emergency Drugs available, Suction available, Patient being monitored and Timeout performed Oxygen Delivery Method: Nasal cannula

## 2019-02-28 NOTE — Anesthesia Postprocedure Evaluation (Signed)
Anesthesia Post Note  Patient: Alynn H Kassem  Procedure(s) Performed: ESOPHAGOGASTRODUODENOSCOPY (EGD) (N/A )  Patient location during evaluation: Endoscopy Anesthesia Type: General Level of consciousness: awake and alert and oriented Pain management: pain level controlled Vital Signs Assessment: post-procedure vital signs reviewed and stable Respiratory status: spontaneous breathing, nonlabored ventilation and respiratory function stable Cardiovascular status: blood pressure returned to baseline and stable Postop Assessment: no signs of nausea or vomiting Anesthetic complications: no     Last Vitals:  Vitals:   02/28/19 1345 02/28/19 1415  BP: (!) 116/45 (!) 109/43  Pulse: (!) 59 (!) 59  Resp: 12 17  Temp:    SpO2: 98% 98%    Last Pain:  Vitals:   02/28/19 1345  TempSrc:   PainSc: 0-No pain                 Atharva Mirsky

## 2019-02-28 NOTE — Progress Notes (Signed)
PROGRESS NOTE    RAILYN HOUSE  BJS:[id number] DOB: [birth year] DOA: 02/27/2019 PCP: Marguarite Arbour, MD    Brief Narrative:  [name] is a 86 y.o. female with medical history significant for hypertension, chronic pain on chronic narcotics as well as history of gastrojejunostomy with gastric outlet obstruction status post dilatation on 07/29/2018, who was in her usual state of health until earlier in the day of arrival when she had a large bloody emesis.   In the ED she was hypotensive and was given fluid boluses.  GI was consulted.  Patient underwent EGD    Consultants:   GI, general surgery  Procedures:  EGD - Esophagitis with no bleed.  Postsurgical deformity in the pylorus. Gastric stenosis was found at the pylorus.  Dilated. Patient symptoms of emesis or from gastric outlet obstruction caused by pyloric stenosis.  Antimicrobials:   None   Subjective: Patient was seen earlier this AM.  Daughter at bedside.  She has had no further emesis this morning while in the ED.  Denies any shortness of breath, chest pain, or dizziness.  Objective: Vitals:   02/28/19 1345 02/28/19 1400 02/28/19 1415 02/28/19 1531  BP: (!) 116/45  (!) 109/43 126/69  Pulse: (!) 59 63 (!) 59   Resp: 12 13 17    Temp:    98.5 F (36.9 C)  TempSrc:    Oral  SpO2: 98% 98% 98%   Weight:    51.2 kg  Height:    5\' 3"  (1.6 m)    Intake/Output Summary (Last 24 hours) at 02/28/2019 1655 Last data filed at 02/28/2019 1240 Gross per 24 hour  Intake 1500 ml  Output --  Net 1500 ml   Filed Weights   02/27/19 2116 02/28/19 1109 02/28/19 1531  Weight: 54.4 kg 54.4 kg 51.2 kg    Examination:  General exam: Appears calm and comfortable, tired looking, daughter at bedside  respiratory system: Clear to auscultation. Respiratory effort normal. Cardiovascular system: S1 & S2 heard, RRR. No JVD, murmurs, rubs, gallops or clicks.  Gastrointestinal system: Abdomen is nondistended, soft and nontender.   Normal bowel sounds heard. Central nervous system: Alert and oriented. No focal neurological deficits. Extremities: No edema Skin: Warm dry Psychiatry: Judgement and insight appear normal. Mood & affect appropriate.     Data Reviewed: I have personally reviewed following labs and imaging studies  CBC: Recent Labs  Lab 02/27/19 2114 02/28/19 0129  WBC 6.8  --   NEUTROABS 5.5  --   HGB 9.1* 12.6  HCT 28.5* 39.1  MCV 97.9  --   PLT 96*  --    Basic Metabolic Panel: Recent Labs  Lab 02/27/19 2114  NA 140  K 4.0  CL 113*  CO2 21*  GLUCOSE 168*  BUN 38*  CREATININE 0.56  CALCIUM 7.6*   GFR: Estimated Creatinine Clearance: 42.3 mL/min (by C-G formula based on SCr of 0.56 mg/dL). Liver Function Tests: Recent Labs  Lab 02/27/19 2114  AST 14*  ALT 13  ALKPHOS 35*  BILITOT 0.6  PROT 4.3*  ALBUMIN 2.5*   Recent Labs  Lab 02/27/19 2114  LIPASE 16   No results for input(s): AMMONIA in the last 168 hours. Coagulation Profile: Recent Labs  Lab 02/27/19 2114 02/28/19 0129  INR 1.2 1.1   Cardiac Enzymes: No results for input(s): CKTOTAL, CKMB, CKMBINDEX, TROPONINI in the last 168 hours. BNP (last 3 results) No results for input(s): PROBNP in the last 8760 hours. HbA1C: No results  for input(s): HGBA1C in the last 72 hours. CBG: Recent Labs  Lab 02/28/19 1525  GLUCAP 73   Lipid Profile: No results for input(s): CHOL, HDL, LDLCALC, TRIG, CHOLHDL, LDLDIRECT in the last 72 hours. Thyroid Function Tests: No results for input(s): TSH, T4TOTAL, FREET4, T3FREE, THYROIDAB in the last 72 hours. Anemia Panel: No results for input(s): VITAMINB12, FOLATE, FERRITIN, TIBC, IRON, RETICCTPCT in the last 72 hours. Sepsis Labs: No results for input(s): PROCALCITON, LATICACIDVEN in the last 168 hours.  Recent Results (from the past 240 hour(s))  Respiratory Panel by RT PCR (Flu A&B, Covid) - Nasopharyngeal Swab     Status: None   Collection Time: 02/27/19  9:14 PM    Specimen: Nasopharyngeal Swab  Result Value Ref Range Status   SARS Coronavirus 2 by RT PCR NEGATIVE NEGATIVE Final    Comment: (NOTE) SARS-CoV-2 target nucleic acids are NOT DETECTED. The SARS-CoV-2 RNA is generally detectable in upper respiratoy specimens during the acute phase of infection. The lowest concentration of SARS-CoV-2 viral copies this assay can detect is 131 copies/mL. A negative result does not preclude SARS-Cov-2 infection and should not be used as the sole basis for treatment or other patient management decisions. A negative result may occur with  improper specimen collection/handling, submission of specimen other than nasopharyngeal swab, presence of viral mutation(s) within the areas targeted by this assay, and inadequate number of viral copies (<131 copies/mL). A negative result must be combined with clinical observations, patient history, and epidemiological information. The expected result is Negative. Fact Sheet for Patients:  PinkCheek.be Fact Sheet for Healthcare Providers:  GravelBags.it This test is not yet ap proved or cleared by the [place] FDA and  has been authorized for detection and/or diagnosis of SARS-CoV-2 by FDA under an Emergency Use Authorization (EUA). This EUA will remain  in effect (meaning this test can be used) for the duration of the COVID-19 declaration under Section 564(b)(1) of the Act, 21 U.S.C. section 360bbb-3(b)(1), unless the authorization is terminated or revoked sooner.    Influenza A by PCR NEGATIVE NEGATIVE Final   Influenza B by PCR NEGATIVE NEGATIVE Final    Comment: (NOTE) The Xpert Xpress SARS-CoV-2/FLU/RSV assay is intended as an aid in  the diagnosis of influenza from Nasopharyngeal swab specimens and  should not be used as a sole basis for treatment. Nasal washings and  aspirates are unacceptable for Xpert Xpress SARS-CoV-2/FLU/RSV  testing. Fact Sheet  for Patients: PinkCheek.be Fact Sheet for Healthcare Providers: GravelBags.it This test is not yet approved or cleared by the [place] FDA and  has been authorized for detection and/or diagnosis of SARS-CoV-2 by  FDA under an Emergency Use Authorization (EUA). This EUA will remain  in effect (meaning this test can be used) for the duration of the  Covid-19 declaration under Section 564(b)(1) of the Act, 21  U.S.C. section 360bbb-3(b)(1), unless the authorization is  terminated or revoked. Performed at [facility], [address], [place], [place] [address]          Radiology Studies: DG Chest Portable 1 View  Result Date: 02/27/2019 CLINICAL DATA:  86 year old female with nausea vomiting. EXAM: PORTABLE CHEST 1 VIEW COMPARISON:  Chest radiograph dated 08/03/2006. FINDINGS: Minimal bibasilar atelectasis. No focal consolidation, pleural effusion, pneumothorax. Stable cardiac silhouette. Atherosclerotic calcification of the aorta. No acute osseous pathology. IMPRESSION: No active cardiopulmonary disease. Electronically Signed   By: Anner Crete M.D.   On: 02/27/2019 21:47   DG UGI W SINGLE CM (SOL  OR THIN BA)  Result Date: 02/28/2019 CLINICAL DATA:  History of GI bleed. Patient underwent endoscopy today. Upper GI requested to evaluate gastric anatomy. History of gastrojejunostomy. EXAM: WATER SOLUBLE UPPER GI SERIES TECHNIQUE: Single-column upper GI series was performed using water soluble contrast. CONTRAST:  Omnipaque 300. COMPARISON:  06/03/1998 30. FLUOROSCOPY TIME:  Fluoroscopy Time:  3 minutes 0 seconds Radiation Exposure Index (if provided by the fluoroscopic device): 47.5 mGy FINDINGS: This was a limited exam due to the patient's age and post endoscopy state. Multiple loops of bowel distention noted, most likely from prior endoscopy, clinical correlation suggested. Patient has had prior lumbar fusion. Esophagus  is widely patent. The stomach and what appears to be the duodenum is widely patent. No evidence of gastric or duodenal distention. A definite gastrojejunostomy anastomosis is not identified. No evidence of contrast extravasation on this limited exam. IMPRESSION: Very limited exam as above. Esophagus, stomach, and what appears to be the duodenum are widely patent. A definite gastrojejunostomy anastomosis is not identified. No evidence of gastric obstruction. Electronically Signed   By: Maisie Fus  Register   On: 02/28/2019 16:29        Scheduled Meds: . sodium chloride   Intravenous Once  . sodium chloride   Intravenous Once  . Chlorhexidine Gluconate Cloth  6 each Topical Daily  . pantoprazole (PROTONIX) IV  40 mg Intravenous Once   Continuous Infusions: . sodium chloride 100 mL/hr at 02/28/19 0055  . sodium chloride    . pantoprozole (PROTONIX) infusion 8 mg/hr (02/28/19 1130)    Assessment & Plan:   Principal Problem:   Acute upper GI bleeding Active Problems:   Gastric outlet obstruction   Hypertension   Long term prescription opiate use   Acute blood loss anemia   History of bypass gastrojejunostomy   Acute upper GI bleed   Acute esophagitis   Complications of internal anastomosis of gastrointestinal tract     Acute upper GI bleeding   Acute blood loss anemia Symptomatic anemia --Admited to stepdown --Continue IV Protonix infusion Status post 2 units packed red blood cells Posttransfusion H&H is stable Status post EGD as mentioned above GI following Per GI emesis or from gastric outlet obstruction caused by pyloric stenosis.-Recommend general surgery consult      Gastric outlet obstruction with history of gastrojejunostomy history of dilatation in June 2020 General surgery consulted and input appreciated-UGI with contrast ordered to evaluate gastric anatomy and previous repair.  If this is stenosed she will most likely benefit from Roux-en-Y to bypass the stenosis this  is not emergent and likely can be done on Monday 1/11 with Dr. Everlene Farrier Now will monitor hemodynamics and abdominal examination       Hypertension -Hold antihypertensives in view of potential unrelated to GI bleeding    Long term prescription opiate use -Patient has a fentanyl patch        DVT prophylaxis: scd  Code Status: dnr  Family Communication:  Discussed and answered all questions with daughter who was present in the room Disposition Plan: Back to previous home environment h/h and hemodynamics is stable. UGI with contrast pending and will need to f/u this and GSx.      LOS: 1 day   Time spent: 45 minutes.  More than 50% COC    Lynn Ito, MD [facility] Pager [phone]  If 7PM-7AM, please contact night-coverage [email or web address] Password [facility] 02/28/2019, 4:55 PM

## 2019-02-28 NOTE — ED Notes (Signed)
Pt assisted to toilet 

## 2019-02-28 NOTE — Anesthesia Preprocedure Evaluation (Addendum)
Anesthesia Evaluation  Patient identified by MRN, date of birth, ID band Patient awake    Reviewed: Allergy & Precautions, NPO status , Patient's Chart, lab work & pertinent test results  History of Anesthesia Complications Negative for: history of anesthetic complications  Airway Mallampati: II  TM Distance: >3 FB Neck ROM: Full    Dental  (+) Poor Dentition   Pulmonary neg pulmonary ROS, neg sleep apnea, neg COPD,    breath sounds clear to auscultation- rhonchi (-) wheezing      Cardiovascular hypertension, Pt. on medications (-) CAD, (-) Past MI, (-) Cardiac Stents and (-) CABG  Rhythm:Regular Rate:Normal - Systolic murmurs and - Diastolic murmurs    Neuro/Psych neg Seizures negative neurological ROS  negative psych ROS   GI/Hepatic Neg liver ROS, GERD  ,  Endo/Other  diabetes  Renal/GU Renal InsufficiencyRenal disease     Musculoskeletal  (+) Arthritis ,   Abdominal (+) - obese,   Peds  Hematology  (+) anemia ,   Anesthesia Other Findings Past Medical History: No date: GERD (gastroesophageal reflux disease) No date: High cholesterol No date: Hypertension   Reproductive/Obstetrics                             Anesthesia Physical Anesthesia Plan  ASA: III  Anesthesia Plan: General   Post-op Pain Management:    Induction: Intravenous  PONV Risk Score and Plan: 2 and Propofol infusion  Airway Management Planned: Natural Airway  Additional Equipment:   Intra-op Plan:   Post-operative Plan:   Informed Consent: I have reviewed the patients History and Physical, chart, labs and discussed the procedure including the risks, benefits and alternatives for the proposed anesthesia with the patient or authorized representative who has indicated his/her understanding and acceptance.   Patient has DNR.  Discussed DNR with patient, Suspend DNR and Discussed DNR with power of  attorney.   Dental advisory given  Plan Discussed with: CRNA and Anesthesiologist  Anesthesia Plan Comments:        Anesthesia Quick Evaluation

## 2019-02-28 NOTE — Consult Note (Signed)
[facility] SURGICAL CONSULTATION NOTE (initial) - cpt: 56433   HISTORY OF PRESENT ILLNESS (HPI):  86 y.o. female presented to [facility] ED on 01/07 for evaluation of emesis. Patient reportedly that earlier on the day of presentation she had a large episode of what she reported as hematemesis followed by multiple episodes of melanotic stools. She was endorsing upper abdominal pain with the emesis. No other complaints. Of note, she has a history of what is believed to be a Bilroth II repair in 2011 with Dr Lemar Livings however the chart is not clear on this and there are conflicting notes regarding exactly what procedure she had. She last underwent EGD and dilation in April of 2020 with Dr Servando Snare. She was found to be hypotensive with EMS but responded appropriately to IVF resuscitation. In the ED, patient has found to have a hemoglobin of 9.3 (which was down from 13) and received 2 units of pRBCs. She was started on IV PPI and admitted to the medicine service.GI was consulted and she underwent EGD with Dr Maximino Greenland, MD on 01/08. This was concerning for possible stenosis of previous anastomosis/GOO.    Since admission, her abdominal pain, nausea, and emesis have subsided. She reports that she is feeling good. No reported issues between her EGD in April with Dr Servando Snare and presentation yesterday.   Surgery is consulted by hospitalist physician Dr. Lynn Ito, MD in this context for evaluation and management of possible gastric outlet obstruction.   PAST MEDICAL HISTORY (PMH):  Past Medical History:  Diagnosis Date  . GERD (gastroesophageal reflux disease)   . High cholesterol   . Hypertension      PAST SURGICAL HISTORY (PSH):  Past Surgical History:  Procedure Laterality Date  . ABDOMINAL SURGERY    . BACK SURGERY    . CHOLECYSTECTOMY    . ESOPHAGOGASTRODUODENOSCOPY N/A 06/04/2018   Procedure: ESOPHAGOGASTRODUODENOSCOPY (EGD);  Surgeon: Pasty Spillers, MD;  Location[facility] ENDOSCOPY;   Service: Endoscopy;  Laterality: N/A;  . ESOPHAGOGASTRODUODENOSCOPY N/A 06/05/2018   Procedure: ESOPHAGOGASTRODUODENOSCOPY (EGD);  Surgeon: Midge Minium, MD;  Location: [facility] ENDOSCOPY;  Service: Endoscopy;  Laterality: N/A;  use Fluoro bed, X-ray is aware  . JOINT REPLACEMENT    . REPLACEMENT TOTAL KNEE Right   . TOE AMPUTATION Left    great toe  . TONSILLECTOMY       MEDICATIONS:  Prior to Admission medications   Medication Sig Start Date End Date Taking? Authorizing Provider  amLODipine (NORVASC) 5 MG tablet Take 5 mg by mouth daily.  01/05/16  Yes [provider]  atorvastatin (LIPITOR) 20 MG tablet Take 20 mg by mouth at bedtime.  09/12/16  Yes [provider]  fentaNYL (DURAGESIC) 100 MCG/HR Place 1 patch onto the skin every 3 (three) days. 03/12/18  Yes [provider]  folic acid (FOLVITE) 1 MG tablet Take 1 mg by mouth daily.  04/10/18  Yes [provider]  omeprazole (PRILOSEC) 20 MG capsule Take 20 mg by mouth 2 (two) times daily.  07/05/15  Yes [provider]  ondansetron (ZOFRAN) 4 MG tablet Take 1 tablet (4 mg total) by mouth every 6 (six) hours as needed for nausea or vomiting. 02/27/19  Yes Midge Minium, MD  promethazine (PHENERGAN) 12.5 MG suppository Place 1 suppository (12.5 mg total) rectally every 6 (six) hours as needed for nausea or vomiting. 02/27/19  Yes Midge Minium, MD     ALLERGIES:  Allergies  Allergen Reactions  . Colesevelam Other (See Comments)  .  Lovastatin Other (See Comments)  . Tetracyclines & Related Rash     SOCIAL HISTORY:  Social History   Socioeconomic History  . Marital status: Married    Spouse name: Not on file  . Number of children: Not on file  . Years of education: Not on file  . Highest education level: Not on file  Occupational History  . Not on file  Tobacco Use  . Smoking status: Never Smoker  . Smokeless tobacco: Never Used  Substance and Sexual Activity  . Alcohol use: Never  . Drug  use: Never  . Sexual activity: Not on file  Other Topics Concern  . Not on file  Social History Narrative  . Not on file   Social Determinants of Health   Financial Resource Strain:   . Difficulty of Paying Living Expenses: Not on file  Food Insecurity:   . Worried About Programme researcher, broadcasting/film/video in the Last Year: Not on file  . Ran Out of Food in the Last Year: Not on file  Transportation Needs:   . Lack of Transportation (Medical): Not on file  . Lack of Transportation (Non-Medical): Not on file  Physical Activity:   . Days of Exercise per Week: Not on file  . Minutes of Exercise per Session: Not on file  Stress:   . Feeling of Stress : Not on file  Social Connections:   . Frequency of Communication with Friends and Family: Not on file  . Frequency of Social Gatherings with Friends and Family: Not on file  . Attends Religious Services: Not on file  . Active Member of Clubs or Organizations: Not on file  . Attends Banker Meetings: Not on file  . Marital Status: Not on file  Intimate Partner Violence:   . Fear of Current or Ex-Partner: Not on file  . Emotionally Abused: Not on file  . Physically Abused: Not on file  . Sexually Abused: Not on file     FAMILY HISTORY:  History reviewed. No pertinent family history.    REVIEW OF SYSTEMS:  Review of Systems  Constitutional: Negative for chills and fever.  HENT: Negative for congestion and sore throat.   Respiratory: Negative for cough and shortness of breath.   Cardiovascular: Negative for chest pain and palpitations.  Gastrointestinal: Positive for abdominal pain, blood in stool, nausea and vomiting.  Neurological: Negative for dizziness and headaches.  All other systems reviewed and are negative.   VITAL SIGNS:  Temp:  [96.5 F (35.8 C)-98.7 F (37.1 C)] 96.5 F (35.8 C) (01/08 1245) Pulse Rate:  [57-94] 59 (01/08 1415) Resp:  [8-17] 17 (01/08 1415) BP: (91-135)/(41-66) 109/43 (01/08 1415) SpO2:  [94  %-100 %] 98 % (01/08 1415) Weight:  [54.4 kg] 54.4 kg (01/08 1109)     Height: 5\' 5"  (165.1 cm) Weight: 54.4 kg BMI (Calculated): 19.96   INTAKE/OUTPUT:  01/07 0701 - 01/08 0700 In: 1000 [I.V.:500; IV Piggyback:500] Out: -   PHYSICAL EXAM:  Physical Exam Vitals and nursing note reviewed.  Constitutional:      General: She is not in acute distress.    Appearance: Normal appearance. She is normal weight. She is not ill-appearing.  HENT:     Head: Normocephalic and atraumatic.  Eyes:     General: No scleral icterus.    Conjunctiva/sclera: Conjunctivae normal.  Cardiovascular:     Rate and Rhythm: Normal rate and regular rhythm.     Pulses: Normal pulses.     Heart  sounds: No murmur. No friction rub. No gallop.   Pulmonary:     Effort: Pulmonary effort is normal. No respiratory distress.     Breath sounds: Normal breath sounds. No wheezing.  Abdominal:     General: A surgical scar is present. There is no distension.     Tenderness: There is no abdominal tenderness. There is no guarding or rebound.     Comments: Abdomen is soft, non-tender, non-distended. No peritonitis. No active emesis. Previous midline upper abdominal laparotomy scar present  Genitourinary:    Comments: Deferred Skin:    General: Skin is warm and dry.     Coloration: Skin is not pale.     Findings: No erythema.  Neurological:     General: No focal deficit present.     Mental Status: She is alert and oriented to person, place, and time.  Psychiatric:        Mood and Affect: Mood normal.        Behavior: Behavior normal.      Labs:  CBC Latest Ref Rng & Units 02/28/2019 02/27/2019 06/04/2018  WBC 4.0 - 10.5 K/uL - 6.8 7.0  Hemoglobin 12.0 - 15.0 g/dL 12.6 9.1(L) 12.8  Hematocrit 36.0 - 46.0 % 39.1 28.5(L) 39.2  Platelets 150 - 400 K/uL - 96(L) 121(L)   CMP Latest Ref Rng & Units 02/27/2019 06/07/2018 06/06/2018  Glucose 70 - 99 mg/dL 168(H) 131(H) 116(H)  BUN 8 - 23 mg/dL 38(H) 16 14  Creatinine 0.44 - 1.00  mg/dL 0.56 0.51 0.55  Sodium 135 - 145 mmol/L 140 138 138  Potassium 3.5 - 5.1 mmol/L 4.0 4.4 3.6  Chloride 98 - 111 mmol/L 113(H) 109 106  CO2 22 - 32 mmol/L 21(L) 24 26  Calcium 8.9 - 10.3 mg/dL 7.6(L) 8.3(L) 8.3(L)  Total Protein 6.5 - 8.1 g/dL 4.3(L) - -  Total Bilirubin 0.3 - 1.2 mg/dL 0.6 - -  Alkaline Phos 38 - 126 U/L 35(L) - -  AST 15 - 41 U/L 14(L) - -  ALT 0 - 44 U/L 13 - -    Chart Review:  EGD (01/08 - Dr Juanda Crumble) reviewed - please refer to her report for details.    Imaging studies:  UGI with contrast pending   Assessment/Plan: (ICD-10's: K31.1) 86 y.o. female with emesis/nausea with concerns for possible stenosis of previous Billroth II repair (2011) on EGD 35/00, complicated by pertinent comorbidities including advanced age.   - UGI with contrast ordered for today to evaluate her gastric anatomy and previous repair. If this is stenosed then she will most likely benefit from roux-en-y to bypass this stenosis. This does not need to be done emergently and likely be done on Monday 1/11 with Dr Dahlia Byes. Family and patient aware of this and will discuss it. Another alternative may be transfer to tertiary center for stent placement although patient would prefer to stay at this facility.    - Remain NPO today + IVF resuscitation  - Monitor H&H; no signs of active bleeding  - antiemetics prn  - monitor abdominal examination  - further management per primary/consulting services  Above plan discussed with Dr Dahlia Byes.      All of the above findings and recommendations were discussed with the patient and her daughter, and all of patient's and her family's questions were answered to their expressed satisfaction.  Thank you for the opportunity to participate in this patient's care.   -- Edison Simon, PA-C [facility] 02/28/2019, 2:54 PM [phone] M-F:  7am - 4pm

## 2019-03-01 ENCOUNTER — Inpatient Hospital Stay: Payer: Medicare PPO

## 2019-03-01 ENCOUNTER — Encounter: Payer: Self-pay | Admitting: Internal Medicine

## 2019-03-01 DIAGNOSIS — K311 Adult hypertrophic pyloric stenosis: Secondary | ICD-10-CM

## 2019-03-01 DIAGNOSIS — K9189 Other postprocedural complications and disorders of digestive system: Secondary | ICD-10-CM

## 2019-03-01 LAB — COMPREHENSIVE METABOLIC PANEL
ALT: 11 U/L (ref 0–44)
AST: 13 U/L — ABNORMAL LOW (ref 15–41)
Albumin: 2.8 g/dL — ABNORMAL LOW (ref 3.5–5.0)
Alkaline Phosphatase: 40 U/L (ref 38–126)
Anion gap: 8 (ref 5–15)
BUN: 20 mg/dL (ref 8–23)
CO2: 19 mmol/L — ABNORMAL LOW (ref 22–32)
Calcium: 8.4 mg/dL — ABNORMAL LOW (ref 8.9–10.3)
Chloride: 119 mmol/L — ABNORMAL HIGH (ref 98–111)
Creatinine, Ser: 0.62 mg/dL (ref 0.44–1.00)
GFR calc Af Amer: >60 mL/min (ref >60)
GFR calc non Af Amer: >60 mL/min (ref >60)
Glucose, Bld: 90 mg/dL (ref 70–99)
Potassium: 3.3 mmol/L — ABNORMAL LOW (ref 3.5–5.1)
Sodium: 146 mmol/L — ABNORMAL HIGH (ref 135–145)
Total Bilirubin: 0.9 mg/dL (ref 0.3–1.2)
Total Protein: 5 g/dL — ABNORMAL LOW (ref 6.5–8.1)

## 2019-03-01 LAB — CBC
HCT: 35.6 % — ABNORMAL LOW (ref 36.0–46.0)
Hemoglobin: 11.8 g/dL — ABNORMAL LOW (ref 12.0–15.0)
MCH: 29.9 pg (ref 26.0–34.0)
MCHC: 33.1 g/dL (ref 30.0–36.0)
MCV: 90.1 fL (ref 80.0–100.0)
Platelets: 83 10*3/uL — ABNORMAL LOW (ref 150–400)
RBC: 3.95 MIL/uL (ref 3.87–5.11)
RDW: 15.3 % (ref 11.5–15.5)
WBC: 4.6 10*3/uL (ref 4.0–10.5)
nRBC: 0 % (ref 0.0–0.2)

## 2019-03-01 LAB — PROTIME-INR
INR: 1.1 (ref 0.8–1.2)
Prothrombin Time: 14 seconds (ref 11.4–15.2)

## 2019-03-01 LAB — APTT: aPTT: 35 seconds (ref 24–36)

## 2019-03-01 MED ORDER — IOHEXOL 9 MG/ML PO SOLN: 500.0000 mL | Freq: Two times a day (BID) | ORAL | Status: DC | PRN

## 2019-03-01 MED ORDER — BOOST / RESOURCE BREEZE PO LIQD CUSTOM
1.0000 | Freq: Three times a day (TID) | ORAL | Status: DC
  Administered 2019-03-01 – 2019-03-02 (×4): 1 via ORAL

## 2019-03-01 MED ORDER — NON FORMULARY: 5.0000 mg | Freq: Once | Status: DC

## 2019-03-01 MED ORDER — MELATONIN 5 MG PO TABS
5.0000 mg | ORAL_TABLET | Freq: Once | ORAL | Status: AC
  Administered 2019-03-01: 5 mg via ORAL
  Filled 2019-03-01: qty 1

## 2019-03-01 MED ORDER — TRAZODONE HCL 50 MG PO TABS
50.0000 mg | ORAL_TABLET | Freq: Once | ORAL | Status: AC
  Administered 2019-03-01: 50 mg via ORAL
  Filled 2019-03-01: qty 1

## 2019-03-01 MED ORDER — FENTANYL 100 MCG/HR TD PT72
1.0000 | MEDICATED_PATCH | TRANSDERMAL | Status: DC
  Administered 2019-03-01: 1 via TRANSDERMAL
  Filled 2019-03-01: qty 1

## 2019-03-01 MED ORDER — IOHEXOL 300 MG/ML  SOLN
80.0000 mL | Freq: Once | INTRAMUSCULAR | Status: AC | PRN
  Administered 2019-03-01: 80 mL via INTRAVENOUS

## 2019-03-01 MED ORDER — POTASSIUM CHLORIDE CRYS ER 20 MEQ PO TBCR
40.0000 meq | EXTENDED_RELEASE_TABLET | Freq: Once | ORAL | Status: AC
  Administered 2019-03-01: 40 meq via ORAL
  Filled 2019-03-01: qty 2

## 2019-03-01 NOTE — Progress Notes (Signed)
Brittany Darby, MD [address]  [place]  [place], [place] [address]  Main: [phone]  Fax: [phone] Pager: [phone]   Subjective: No acute events overnight.  Patient is currently on clear liquids.  She underwent EGD yesterday with dilation of gastrojejunal stricture She underwent CT abdomen today which revealed narrowing of the gastrojejunal anastomosis Patient is evaluated by general surgery Patient's daughter is bedside   Objective: Vital signs in last 24 hours: Vitals:   03/01/19 0900 03/01/19 1000 03/01/19 1214 03/01/19 1442  BP: (!) 148/69 140/72 134/70 127/66  Pulse: 76 64 (!) 58 (!) 55  Resp: 17 10 14 14   Temp:   98.6 F (37 C) 97.9 F (36.6 C)  TempSrc:   Oral Oral  SpO2: 100% 100% 100% 95%  Weight:      Height:       Weight change: -0.032 kg  Intake/Output Summary (Last 24 hours) at 03/01/2019 1601 Last data filed at 03/01/2019 1000 Gross per 24 hour  Intake 2453.82 ml  Output 250 ml  Net 2203.82 ml     Exam: Heart:: Regular rate and rhythm, S1S2 present or without murmur or extra heart sounds Lungs: normal and clear to auscultation Abdomen: soft, nontender, normal bowel sounds   Lab Results: CBC Latest Ref Rng & Units 03/01/2019 02/28/2019 02/27/2019  WBC 4.0 - 10.5 K/uL 4.6 - 6.8  Hemoglobin 12.0 - 15.0 g/dL 11.8(L) 12.6 9.1(L)  Hematocrit 36.0 - 46.0 % 35.6(L) 39.1 28.5(L)  Platelets 150 - 400 K/uL 83(L) - 96(L)   CMP Latest Ref Rng & Units 03/01/2019 02/27/2019 06/07/2018  Glucose 70 - 99 mg/dL 90 168(H) 131(H)  BUN 8 - 23 mg/dL 20 38(H) 16  Creatinine 0.44 - 1.00 mg/dL 0.62 0.56 0.51  Sodium 135 - 145 mmol/L 146(H) 140 138  Potassium 3.5 - 5.1 mmol/L 3.3(L) 4.0 4.4  Chloride 98 - 111 mmol/L 119(H) 113(H) 109  CO2 22 - 32 mmol/L 19(L) 21(L) 24  Calcium 8.9 - 10.3 mg/dL 8.4(L) 7.6(L) 8.3(L)  Total Protein 6.5 - 8.1 g/dL 5.0(L) 4.3(L) -  Total Bilirubin 0.3 - 1.2 mg/dL 0.9 0.6 -  Alkaline Phos 38 - 126 U/L 40 35(L) -  AST 15 - 41  U/L 13(L) 14(L) -  ALT 0 - 44 U/L 11 13 -    Micro Results: Recent Results (from the past 240 hour(s))  MRSA PCR Screening     Status: None   Collection Time: 02/27/19  9:10 PM   Specimen: Nasopharyngeal  Result Value Ref Range Status   MRSA by PCR NEGATIVE NEGATIVE Final    Comment:        The GeneXpert MRSA Assay (FDA approved for NASAL specimens only), is one component of a comprehensive MRSA colonization surveillance program. It is not intended to diagnose MRSA infection nor to guide or monitor treatment for MRSA infections. Performed at [facility], [address] [place], [place] [address]   Respiratory Panel by RT PCR (Flu A&B, Covid) - Nasopharyngeal Swab     Status: None   Collection Time: 02/27/19  9:14 PM   Specimen: Nasopharyngeal Swab  Result Value Ref Range Status   SARS Coronavirus 2 by RT PCR NEGATIVE NEGATIVE Final    Comment: (NOTE) SARS-CoV-2 target nucleic acids are NOT DETECTED. The SARS-CoV-2 RNA is generally detectable in upper respiratoy specimens during the acute phase of infection. The lowest concentration of SARS-CoV-2 viral copies this assay can detect is 131 copies/mL. A negative result does  not preclude SARS-Cov-2 infection and should not be used as the sole basis for treatment or other patient management decisions. A negative result may occur with  improper specimen collection/handling, submission of specimen other than nasopharyngeal swab, presence of viral mutation(s) within the areas targeted by this assay, and inadequate number of viral copies (<131 copies/mL). A negative result must be combined with clinical observations, patient history, and epidemiological information. The expected result is Negative. Fact Sheet for Patients:  https://www.moore.com/ Fact Sheet for Healthcare Providers:  https://www.young.biz/ This test is not yet ap proved or cleared by the [place] FDA and  has  been authorized for detection and/or diagnosis of SARS-CoV-2 by FDA under an Emergency Use Authorization (EUA). This EUA will remain  in effect (meaning this test can be used) for the duration of the COVID-19 declaration under Section 564(b)(1) of the Act, 21 U.S.C. section 360bbb-3(b)(1), unless the authorization is terminated or revoked sooner.    Influenza A by PCR NEGATIVE NEGATIVE Final   Influenza B by PCR NEGATIVE NEGATIVE Final    Comment: (NOTE) The Xpert Xpress SARS-CoV-2/FLU/RSV assay is intended as an aid in  the diagnosis of influenza from Nasopharyngeal swab specimens and  should not be used as a sole basis for treatment. Nasal washings and  aspirates are unacceptable for Xpert Xpress SARS-CoV-2/FLU/RSV  testing. Fact Sheet for Patients: https://www.moore.com/ Fact Sheet for Healthcare Providers: https://www.young.biz/ This test is not yet approved or cleared by the [place] FDA and  has been authorized for detection and/or diagnosis of SARS-CoV-2 by  FDA under an Emergency Use Authorization (EUA). This EUA will remain  in effect (meaning this test can be used) for the duration of the  Covid-19 declaration under Section 564(b)(1) of the Act, 21  U.S.C. section 360bbb-3(b)(1), unless the authorization is  terminated or revoked. Performed at [facility], [address] [place], [place] [address]    Studies/Results: CT ABDOMEN PELVIS W CONTRAST  Result Date: 03/01/2019 CLINICAL DATA:  Inpatient. History of nonspecific gastric surgery with gastric outlet obstruction status post stricture dilation. EXAM: CT ABDOMEN AND PELVIS WITH CONTRAST TECHNIQUE: Multidetector CT imaging of the abdomen and pelvis was performed using the standard protocol following bolus administration of intravenous contrast. CONTRAST:  7mL OMNIPAQUE IOHEXOL 300 MG/ML  SOLN COMPARISON:  02/28/2019 upper GI.  06/02/2018 CT abdomen/pelvis. FINDINGS:  Lower chest: Small dependent bilateral pleural effusions with minimal dependent bibasilar atelectasis. Coronary atherosclerosis. Hepatobiliary: Normal liver size. No liver mass. Cholecystectomy. Bile ducts are stable and within normal post cholecystectomy limits with CBD diameter 8 mm. Pancreas: Normal, with no mass or duct dilation. Spleen: Normal size. No mass. Adrenals/Urinary Tract: Normal adrenals. No hydronephrosis. Simple 1.4 cm upper left renal cyst. No suspicious renal masses. Normal bladder. Stomach/Bowel: Stomach is mildly distended by oral contrast. A narrow gastrojejunostomy is identified along the greater curvature in the distal body/antrum with gastrojejunostomy luminal diameter approximately 0.8 cm (series 5/image 28). No definite gastric wall thickening. No appreciable gastric resection. There is an abrupt caliber transition between the first and second portions of the duodenum with collapsed second, third and fourth portions of the duodenum. No appreciable duodenal wall thickening. Normal caliber small bowel with no small bowel wall thickening. Dense oral contrast throughout the colon. Appendix not discretely visualized. No large bowel wall thickening, diverticulosis or pericolonic fat stranding. Vascular/Lymphatic: Atherosclerotic nonaneurysmal abdominal aorta. Patent portal, splenic, hepatic and renal veins. No pathologically enlarged lymph nodes in the abdomen or pelvis. Reproductive: Grossly normal uterus.  No  adnexal mass. Other: No pneumoperitoneum. Trace free fluid in pelvis. No focal fluid collections. Musculoskeletal: No aggressive appearing focal osseous lesions. Status post bilateral posterior spinal fusion L2-S1 with fusion of the L2-3 through L4-5 disc spaces. IMPRESSION: 1. Narrow gastrojejunostomy identified along the greater curvature in the distal body/antrum with 0.8 cm gastrojejunostomy anastomotic luminal diameter. Stomach mildly distended by oral contrast. No evidence of  resection of any portions of the stomach. 2. Abrupt caliber transition between the first and second portions of the duodenum, cannot exclude a stricture in this location. No gastric, duodenal, small or large bowel wall thickening. 3. Oral contrast transits to the left colon. No free air. No abscess. 4. Small dependent bilateral pleural effusions. 5.  Aortic Atherosclerosis (ICD10-I70.0). Electronically Signed   By: Delbert Phenix M.D.   On: 03/01/2019 11:54   DG Chest Portable 1 View  Result Date: 02/27/2019 CLINICAL DATA:  86 year old female with nausea vomiting. EXAM: PORTABLE CHEST 1 VIEW COMPARISON:  Chest radiograph dated 08/03/2006. FINDINGS: Minimal bibasilar atelectasis. No focal consolidation, pleural effusion, pneumothorax. Stable cardiac silhouette. Atherosclerotic calcification of the aorta. No acute osseous pathology. IMPRESSION: No active cardiopulmonary disease. Electronically Signed   By: Elgie Collard M.D.   On: 02/27/2019 21:47   DG Abd Portable 2V  Result Date: 03/01/2019 CLINICAL DATA:  Gastric outlet obstruction. History of gastrojejunostomy vomiting and GI bleeding. Pyloric stenosis on recent upper endoscopy. EXAM: PORTABLE ABDOMEN - 2 VIEW COMPARISON:  Upper GI series 02/28/2019 FINDINGS: Stomach is nondistended. There is gaseous distention of small bowel and colon. The oral contrast material administered for upper GI series yesterday has migrated through to the colon with no evidence for residual gastric contrast at this time. Bones are diffusely demineralized. Patient is status post lumbosacral fusion. IMPRESSION: Oral contrast material seen in stomach and proximal small bowel on yesterday's study has migrated into the colon. Diffuse gaseous distention of large and small bowel similar to yesterday's exam. Electronically Signed   By: Kennith Center M.D.   On: 03/01/2019 09:19   DG UGI W SINGLE CM (SOL OR THIN BA)  Result Date: 02/28/2019 CLINICAL DATA:  History of GI bleed. Patient  underwent endoscopy today. Upper GI requested to evaluate gastric anatomy. History of gastrojejunostomy. EXAM: WATER SOLUBLE UPPER GI SERIES TECHNIQUE: Single-column upper GI series was performed using water soluble contrast. CONTRAST:  Omnipaque 300. COMPARISON:  06/03/1998 30. FLUOROSCOPY TIME:  Fluoroscopy Time:  3 minutes 0 seconds Radiation Exposure Index (if provided by the fluoroscopic device): 47.5 mGy FINDINGS: This was a limited exam due to the patient's age and post endoscopy state. Multiple loops of bowel distention noted, most likely from prior endoscopy, clinical correlation suggested. Patient has had prior lumbar fusion. Esophagus is widely patent. The stomach and what appears to be the duodenum is widely patent. No evidence of gastric or duodenal distention. A definite gastrojejunostomy anastomosis is not identified. No evidence of contrast extravasation on this limited exam. IMPRESSION: Very limited exam as above. Esophagus, stomach, and what appears to be the duodenum are widely patent. A definite gastrojejunostomy anastomosis is not identified. No evidence of gastric obstruction. Electronically Signed   By: Maisie Fus  Register   On: 02/28/2019 16:29   Medications:  I have reviewed the patient's current medications. Prior to Admission:  Medications Prior to Admission  Medication Sig Dispense Refill Last Dose  . amLODipine (NORVASC) 5 MG tablet Take 5 mg by mouth daily.    Past Week at Unknown time  . atorvastatin (LIPITOR) 20 MG  tablet Take 20 mg by mouth at bedtime.    Past Week at Unknown time  . fentaNYL (DURAGESIC) 100 MCG/HR Place 1 patch onto the skin every 3 (three) days.   02/27/2019 at 0330  . folic acid (FOLVITE) 1 MG tablet Take 1 mg by mouth daily.    Past Week at Unknown time  . omeprazole (PRILOSEC) 20 MG capsule Take 20 mg by mouth 2 (two) times daily.    Past Week at Unknown time  . ondansetron (ZOFRAN) 4 MG tablet Take 1 tablet (4 mg total) by mouth every 6 (six) hours as  needed for nausea or vomiting. 20 tablet 1 prn at prn  . promethazine (PHENERGAN) 12.5 MG suppository Place 1 suppository (12.5 mg total) rectally every 6 (six) hours as needed for nausea or vomiting. 12 each 0 prn at prn   Scheduled: . sodium chloride   Intravenous Once  . sodium chloride   Intravenous Once  . Chlorhexidine Gluconate Cloth  6 each Topical Daily  . feeding supplement  1 Container Oral TID BM  . pantoprazole (PROTONIX) IV  40 mg Intravenous Once   Continuous: . pantoprozole (PROTONIX) infusion 8 mg/hr (03/01/19 1000)   ZOX:[id number] **OR** acetaminophen, iohexol, ondansetron **OR** ondansetron (ZOFRAN) IV, senna-docusate Anti-infectives (From admission, onward)   None     Scheduled Meds: . sodium chloride   Intravenous Once  . sodium chloride   Intravenous Once  . Chlorhexidine Gluconate Cloth  6 each Topical Daily  . feeding supplement  1 Container Oral TID BM  . pantoprazole (PROTONIX) IV  40 mg Intravenous Once   Continuous Infusions: . 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 Everlene Farrier who has discussed about the surgical revision with patient's daughter.  However, she is reluctant to have her mom  undergo surgery given her age and functional capacity Alternate options such as stent placement at a tertiary care facility or repeat EGD with dilation at regular intervals were personally discussed with patient's daughter She is interested in either options.   Advance diet as tolerated Long-term PPI twice daily Follow-up with GI as outpatient in a month   LOS: 2 days   [name] 03/01/2019, 4:01 PM

## 2019-03-01 NOTE — Progress Notes (Signed)
PROGRESS NOTE    [name]  YCX:[id number] DOB: [birth year] DOA: 02/27/2019 PCP: Marguarite Arbour, MD    Brief Narrative:  [name] is a 86 y.o. female with medical history significant for hypertension, chronic pain on chronic narcotics as well as history of gastrojejunostomy with gastric outlet obstruction status post dilatation on 07/29/2018, who was in her usual state of health until earlier in the day of arrival when she had a large bloody emesis.   In the ED she was hypotensive and was given fluid boluses.  GI was consulted.  Patient underwent EGD    Consultants:   GI, general surgery  Procedures:  EGD - Esophagitis with no bleed.  Postsurgical deformity in the pylorus. Gastric stenosis was found at the pylorus.  Dilated. Patient symptoms of emesis or from gastric outlet obstruction caused by pyloric stenosis.  Antimicrobials:   None   Subjective: Patient has no new complaints today.  No further episodes of emesis.  No bloody stool.  She is drinking the contrast this AM.  Denies shortness of breath, chest pain or dizziness  Objective: Vitals:   03/01/19 0300 03/01/19 0400 03/01/19 0500 03/01/19 0600  BP: (!) 122/52 (!) 132/51 (!) 128/55 (!) 147/60  Pulse: (!) 54 (!) 51 (!) 56 64  Resp: 10 11 (!) 8 10  Temp:  98.3 F (36.8 C)    TempSrc:  Oral    SpO2: 95% 95% 96% 98%  Weight:      Height:        Intake/Output Summary (Last 24 hours) at 03/01/2019 1027 Last data filed at 03/01/2019 0200 Gross per 24 hour  Intake 1480.89 ml  Output --  Net 1480.89 ml   Filed Weights   02/27/19 2116 02/28/19 1109 02/28/19 1531  Weight: 54.4 kg 54.4 kg 51.2 kg    Examination:  General exam: Appears calm and comfortable, NAD respiratory system: Clear to auscultation. Respiratory effort normal.  No wheeze rales rhonchi Cardiovascular system: S1 & S2 heard, RRR. No JVD, murmurs, rubs, gallops or clicks.  Gastrointestinal system: Abdomen is nondistended, soft and  nontender.  Normal bowel sounds heard.  No rebound or guarding Central nervous system: Alert and oriented. No focal neurological deficits. Extremities: No edema or cyanosis Skin: Warm dry Psychiatry:  Mood & affect appropriate.     Data Reviewed: I have personally reviewed following labs and imaging studies  CBC: Recent Labs  Lab 02/27/19 2114 02/28/19 0129 03/01/19 0429  WBC 6.8  --  4.6  NEUTROABS 5.5  --   --   HGB 9.1* 12.6 11.8*  HCT 28.5* 39.1 35.6*  MCV 97.9  --  90.1  PLT 96*  --  83*   Basic Metabolic Panel: Recent Labs  Lab 02/27/19 2114 03/01/19 0429  NA 140 146*  K 4.0 3.3*  CL 113* 119*  CO2 21* 19*  GLUCOSE 168* 90  BUN 38* 20  CREATININE 0.56 0.62  CALCIUM 7.6* 8.4*   GFR: Estimated Creatinine Clearance: 42.3 mL/min (by C-G formula based on SCr of 0.62 mg/dL). Liver Function Tests: Recent Labs  Lab 02/27/19 2114 03/01/19 0429  AST 14* 13*  ALT 13 11  ALKPHOS 35* 40  BILITOT 0.6 0.9  PROT 4.3* 5.0*  ALBUMIN 2.5* 2.8*   Recent Labs  Lab 02/27/19 2114  LIPASE 16   No results for input(s): AMMONIA in the last 168 hours. Coagulation Profile: Recent Labs  Lab 02/27/19 2114 02/28/19 0129 03/01/19 0429  INR 1.2 1.1 1.1  Cardiac Enzymes: No results for input(s): CKTOTAL, CKMB, CKMBINDEX, TROPONINI in the last 168 hours. BNP (last 3 results) No results for input(s): PROBNP in the last 8760 hours. HbA1C: No results for input(s): HGBA1C in the last 72 hours. CBG: Recent Labs  Lab 02/28/19 1525  GLUCAP 73   Lipid Profile: No results for input(s): CHOL, HDL, LDLCALC, TRIG, CHOLHDL, LDLDIRECT in the last 72 hours. Thyroid Function Tests: No results for input(s): TSH, T4TOTAL, FREET4, T3FREE, THYROIDAB in the last 72 hours. Anemia Panel: No results for input(s): VITAMINB12, FOLATE, FERRITIN, TIBC, IRON, RETICCTPCT in the last 72 hours. Sepsis Labs: No results for input(s): PROCALCITON, LATICACIDVEN in the last 168 hours.  Recent  Results (from the past 240 hour(s))  MRSA PCR Screening     Status: None   Collection Time: 02/27/19  9:10 PM   Specimen: Nasopharyngeal  Result Value Ref Range Status   MRSA by PCR NEGATIVE NEGATIVE Final    Comment:        The GeneXpert MRSA Assay (FDA approved for NASAL specimens only), is one component of a comprehensive MRSA colonization surveillance program. It is not intended to diagnose MRSA infection nor to guide or monitor treatment for MRSA infections. Performed at [facility], [address] [place], [place] [address]   Respiratory Panel by RT PCR (Flu A&B, Covid) - Nasopharyngeal Swab     Status: None   Collection Time: 02/27/19  9:14 PM   Specimen: Nasopharyngeal Swab  Result Value Ref Range Status   SARS Coronavirus 2 by RT PCR NEGATIVE NEGATIVE Final    Comment: (NOTE) SARS-CoV-2 target nucleic acids are NOT DETECTED. The SARS-CoV-2 RNA is generally detectable in upper respiratoy specimens during the acute phase of infection. The lowest concentration of SARS-CoV-2 viral copies this assay can detect is 131 copies/mL. A negative result does not preclude SARS-Cov-2 infection and should not be used as the sole basis for treatment or other patient management decisions. A negative result may occur with  improper specimen collection/handling, submission of specimen other than nasopharyngeal swab, presence of viral mutation(s) within the areas targeted by this assay, and inadequate number of viral copies (<131 copies/mL). A negative result must be combined with clinical observations, patient history, and epidemiological information. The expected result is Negative. Fact Sheet for Patients:  https://www.moore.com/ Fact Sheet for Healthcare Providers:  https://www.young.biz/ This test is not yet ap proved or cleared by the [place] FDA and  has been authorized for detection and/or diagnosis of SARS-CoV-2 by FDA  under an Emergency Use Authorization (EUA). This EUA will remain  in effect (meaning this test can be used) for the duration of the COVID-19 declaration under Section 564(b)(1) of the Act, 21 U.S.C. section 360bbb-3(b)(1), unless the authorization is terminated or revoked sooner.    Influenza A by PCR NEGATIVE NEGATIVE Final   Influenza B by PCR NEGATIVE NEGATIVE Final    Comment: (NOTE) The Xpert Xpress SARS-CoV-2/FLU/RSV assay is intended as an aid in  the diagnosis of influenza from Nasopharyngeal swab specimens and  should not be used as a sole basis for treatment. Nasal washings and  aspirates are unacceptable for Xpert Xpress SARS-CoV-2/FLU/RSV  testing. Fact Sheet for Patients: https://www.moore.com/ Fact Sheet for Healthcare Providers: https://www.young.biz/ This test is not yet approved or cleared by the [place] FDA and  has been authorized for detection and/or diagnosis of SARS-CoV-2 by  FDA under an Emergency Use Authorization (EUA). This EUA will remain  in effect (meaning this test can  be used) for the duration of the  Covid-19 declaration under Section 564(b)(1) of the Act, 21  U.S.C. section 360bbb-3(b)(1), unless the authorization is  terminated or revoked. Performed at [facility], [address], [place], [place] [address]          Radiology Studies: DG Chest Portable 1 View  Result Date: 02/27/2019 CLINICAL DATA:  86 year old female with nausea vomiting. EXAM: PORTABLE CHEST 1 VIEW COMPARISON:  Chest radiograph dated 08/03/2006. FINDINGS: Minimal bibasilar atelectasis. No focal consolidation, pleural effusion, pneumothorax. Stable cardiac silhouette. Atherosclerotic calcification of the aorta. No acute osseous pathology. IMPRESSION: No active cardiopulmonary disease. Electronically Signed   By: Elgie Collard M.D.   On: 02/27/2019 21:47   DG Abd Portable 2V  Result Date: 03/01/2019 CLINICAL DATA:   Gastric outlet obstruction. History of gastrojejunostomy vomiting and GI bleeding. Pyloric stenosis on recent upper endoscopy. EXAM: PORTABLE ABDOMEN - 2 VIEW COMPARISON:  Upper GI series 02/28/2019 FINDINGS: Stomach is nondistended. There is gaseous distention of small bowel and colon. The oral contrast material administered for upper GI series yesterday has migrated through to the colon with no evidence for residual gastric contrast at this time. Bones are diffusely demineralized. Patient is status post lumbosacral fusion. IMPRESSION: Oral contrast material seen in stomach and proximal small bowel on yesterday's study has migrated into the colon. Diffuse gaseous distention of large and small bowel similar to yesterday's exam. Electronically Signed   By: Kennith Center M.D.   On: 03/01/2019 09:19   DG UGI W SINGLE CM (SOL OR THIN BA)  Result Date: 02/28/2019 CLINICAL DATA:  History of GI bleed. Patient underwent endoscopy today. Upper GI requested to evaluate gastric anatomy. History of gastrojejunostomy. EXAM: WATER SOLUBLE UPPER GI SERIES TECHNIQUE: Single-column upper GI series was performed using water soluble contrast. CONTRAST:  Omnipaque 300. COMPARISON:  06/03/1998 30. FLUOROSCOPY TIME:  Fluoroscopy Time:  3 minutes 0 seconds Radiation Exposure Index (if provided by the fluoroscopic device): 47.5 mGy FINDINGS: This was a limited exam due to the patient's age and post endoscopy state. Multiple loops of bowel distention noted, most likely from prior endoscopy, clinical correlation suggested. Patient has had prior lumbar fusion. Esophagus is widely patent. The stomach and what appears to be the duodenum is widely patent. No evidence of gastric or duodenal distention. A definite gastrojejunostomy anastomosis is not identified. No evidence of contrast extravasation on this limited exam. IMPRESSION: Very limited exam as above. Esophagus, stomach, and what appears to be the duodenum are widely patent. A definite  gastrojejunostomy anastomosis is not identified. No evidence of gastric obstruction. Electronically Signed   By: Maisie Fus  Register   On: 02/28/2019 16:29        Scheduled Meds: . sodium chloride   Intravenous Once  . sodium chloride   Intravenous Once  . Chlorhexidine Gluconate Cloth  6 each Topical Daily  . pantoprazole (PROTONIX) IV  40 mg Intravenous Once   Continuous Infusions: . pantoprozole (PROTONIX) infusion 8 mg/hr (03/01/19 0200)    Assessment & Plan:   Principal Problem:   Acute upper GI bleeding Active Problems:   Gastric outlet obstruction   Hypertension   Long term prescription opiate use   Acute blood loss anemia   History of bypass gastrojejunostomy   Acute upper GI bleed   Acute esophagitis   Complications of internal anastomosis of gastrointestinal tract     Acute upper GI bleeding   Acute blood loss anemia Symptomatic anemia --Admited to stepdown --Continue IV Protonix infusion  Status post 2 units packed red blood cells Posttransfusion H&H is stable Status post EGD as mentioned above GI following Per GI emesis or from gastric outlet obstruction caused by pyloric stenosis.-surgery saw pt, plz see below      Gastric outlet obstruction with history of gastrojejunostomy history of dilatation in June 2020 General surgery consulted and input appreciated-UGI with contrast ordered to evaluate gastric anatomy and previous repair.  If this is stenosed she will most likely benefit from Roux-en-Y to bypass the stenosis this is not emergent, however if family and patient discussed with Dr. Dahlia Byes that they wish to have stent placed and this will need to be done at [facility].  If she is able to tolerate p.o. it does not have to be an urgent transfer.will monitor overnight        Hypertension -Hold antihypertensives in view of potential unrelated to GI bleeding -resolved    Long term prescription opiate use -Patient has a fentanyl patch        DVT  prophylaxis: scd  Code Status: dnr  Family Communication:  Discussed and answered all questions with daughter who was present in the room Disposition Plan:  If patient tolerates p.o. and H&H is stable can discharge home in a.m. with outpatient transfer to [facility] for stent placement with Dr. Gayleen Orem interventional endoscopy there. If not stable it will be transfer as inpt.   LOS: 2 days   Time spent: 45 minutes.  More than 50% COC    Nolberto Hanlon, MD [facility] Pager [phone]  If 7PM-7AM, please contact night-coverage [email or web address] Password [facility] 03/01/2019, 10:27 AM Patient ID: MYKAL KIRCHMAN, female   DOB: November 02, 1932, 86 y.o.   MRN: [id number]

## 2019-03-02 LAB — IRON AND TIBC
Iron: 91 ug/dL (ref 28–170)
Saturation Ratios: 45 % — ABNORMAL HIGH (ref 10.4–31.8)
TIBC: 202 ug/dL — ABNORMAL LOW (ref 250–450)
UIBC: 111 ug/dL

## 2019-03-02 LAB — CBC WITH DIFFERENTIAL/PLATELET
Abs Immature Granulocytes: 0.01 10*3/uL (ref 0.00–0.07)
Basophils Absolute: 0 10*3/uL (ref 0.0–0.1)
Basophils Relative: 1 %
Eosinophils Absolute: 0.2 10*3/uL (ref 0.0–0.5)
Eosinophils Relative: 4 %
HCT: 35 % — ABNORMAL LOW (ref 36.0–46.0)
Hemoglobin: 11.6 g/dL — ABNORMAL LOW (ref 12.0–15.0)
Immature Granulocytes: 0 %
Lymphocytes Relative: 31 %
Lymphs Abs: 1.2 10*3/uL (ref 0.7–4.0)
MCH: 30.1 pg (ref 26.0–34.0)
MCHC: 33.1 g/dL (ref 30.0–36.0)
MCV: 90.9 fL (ref 80.0–100.0)
Monocytes Absolute: 0.5 10*3/uL (ref 0.1–1.0)
Monocytes Relative: 12 %
Neutro Abs: 2.1 10*3/uL (ref 1.7–7.7)
Neutrophils Relative %: 52 %
Platelets: 80 10*3/uL — ABNORMAL LOW (ref 150–400)
RBC: 3.85 MIL/uL — ABNORMAL LOW (ref 3.87–5.11)
RDW: 14.6 % (ref 11.5–15.5)
WBC: 3.9 10*3/uL — ABNORMAL LOW (ref 4.0–10.5)
nRBC: 0 % (ref 0.0–0.2)

## 2019-03-02 LAB — VITAMIN B12: Vitamin B-12: 1271 pg/mL — ABNORMAL HIGH (ref 180–914)

## 2019-03-02 LAB — FERRITIN: Ferritin: 122 ng/mL (ref 11–307)

## 2019-03-02 LAB — FOLATE: Folate: 33 ng/mL (ref >5.9)

## 2019-03-02 MED ORDER — PANTOPRAZOLE SODIUM 40 MG PO TBEC: 40.0000 mg | DELAYED_RELEASE_TABLET | Freq: Two times a day (BID) | ORAL | Status: DC

## 2019-03-02 MED ORDER — PANTOPRAZOLE SODIUM 40 MG PO TBEC: 40.0000 mg | DELAYED_RELEASE_TABLET | Freq: Two times a day (BID) | ORAL | 0 refills | Status: DC

## 2019-03-02 NOTE — Progress Notes (Signed)
Patient is being discharged home with daughter this afternoon. [facility] will contact patient tomorrow about setting up PT. DC & Rx instructions given and daughter acknowledged understanding.  Belongings packed and IV removed. NT will help prepare her for transportation home.

## 2019-03-02 NOTE — Progress Notes (Signed)
[name] is an 86 year old female with a prior history of peptic ulcer disease and pyloric stenosis status post what seems to be gastrojejunostomy with out gastrectomy by Dr. Lemar Livings 10 years ago.  Now comes in for stricture at the gastrojejunostomy that was able to be dilated.  She is debilitated and has chronic pain.  I have discussed the case in detail with Dr. Allegra Lai and with her daughter.  Since she has tolerated p.o. they wanted to pursue may be intermittent dilation endoscopically.   Please note that I have discussed and personally reviewed  in detail with our radiologist multiple CT scans and upper GI series. They want to avoid any major surgical intervention.  Currently her abdomen is benign and there is no peritonitis.  I will be happy to follow her as an outpatient in a few weeks and if endoscopic intervention fails we can always do gastric resection with a Roux-en-Y gastrojejunostomy and a feeding jejunostomy.  For now she may go home and follow-up with [place] in the outpatient setting.  Please note that I spent at least 35 minutes in this encounter with greater than 50% spent in coordination and counseling of her care

## 2019-03-02 NOTE — Progress Notes (Signed)
[name], MD [address]  [address]  [place], [place] [address]  Main: [phone]  Fax: [phone] Pager: [phone]   Subjective: No acute events overnight.  Patient has tolerated soft solid food very well.  She also had a bowel movement. Patient's daughter is bedside   Objective: Vital signs in last 24 hours: Vitals:   03/01/19 2017 03/02/19 0506 03/02/19 0822 03/02/19 1559  BP: 134/62 (!) 116/53 (!) 119/55 (!) 139/52  Pulse: (!) 54 (!) 56 (!) 55 (!) 58  Resp: 18  16 17   Temp: 98 F (36.7 C) 97.6 F (36.4 C) 97.8 F (36.6 C) 98.5 F (36.9 C)  TempSrc: Oral Oral Oral Oral  SpO2: 96% 97% 96% 99%  Weight:      Height:       Weight change:   Intake/Output Summary (Last 24 hours) at 03/02/2019 1633 Last data filed at 03/02/2019 0526 Gross per 24 hour  Intake -  Output 500 ml  Net -500 ml     Exam: Heart:: Regular rate and rhythm, S1S2 present or without murmur or extra heart sounds Lungs: normal and clear to auscultation Abdomen: soft, nontender, normal bowel sounds   Lab Results: CBC Latest Ref Rng & Units 03/02/2019 03/01/2019 02/28/2019  WBC 4.0 - 10.5 K/uL 3.9(L) 4.6 -  Hemoglobin 12.0 - 15.0 g/dL 11.6(L) 11.8(L) 12.6  Hematocrit 36.0 - 46.0 % 35.0(L) 35.6(L) 39.1  Platelets 150 - 400 K/uL 80(L) 83(L) -   CMP Latest Ref Rng & Units 03/01/2019 02/27/2019 06/07/2018  Glucose 70 - 99 mg/dL 90 06/09/2018) 962(X)  BUN 8 - 23 mg/dL 20 528(U) 16  Creatinine 0.44 - 1.00 mg/dL 13(K 4.40 1.02  Sodium 135 - 145 mmol/L 146(H) 140 138  Potassium 3.5 - 5.1 mmol/L 3.3(L) 4.0 4.4  Chloride 98 - 111 mmol/L 119(H) 113(H) 109  CO2 22 - 32 mmol/L 19(L) 21(L) 24  Calcium 8.9 - 10.3 mg/dL 7.25) 7.6(L) 8.3(L)  Total Protein 6.5 - 8.1 g/dL 5.0(L) 4.3(L) -  Total Bilirubin 0.3 - 1.2 mg/dL 0.9 0.6 -  Alkaline Phos 38 - 126 U/L 40 35(L) -  AST 15 - 41 U/L 13(L) 14(L) -  ALT 0 - 44 U/L 11 13 -    Micro Results: Recent Results (from the past 240 hour(s))  MRSA PCR  Screening     Status: None   Collection Time: 02/27/19  9:10 PM   Specimen: Nasopharyngeal  Result Value Ref Range Status   MRSA by PCR NEGATIVE NEGATIVE Final    Comment:        The GeneXpert MRSA Assay (FDA approved for NASAL specimens only), is one component of a comprehensive MRSA colonization surveillance program. It is not intended to diagnose MRSA infection nor to guide or monitor treatment for MRSA infections. Performed at [facility], [address] [place], [place]   Respiratory Panel by RT PCR (Flu A&B, Covid) - Nasopharyngeal Swab     Status: None   Collection Time: 02/27/19  9:14 PM   Specimen: Nasopharyngeal Swab  Result Value Ref Range Status   SARS Coronavirus 2 by RT PCR NEGATIVE NEGATIVE Final    Comment: (NOTE) SARS-CoV-2 target nucleic acids are NOT DETECTED. The SARS-CoV-2 RNA is generally detectable in upper respiratoy specimens during the acute phase of infection. The lowest concentration of SARS-CoV-2 viral copies this assay can detect is 131 copies/mL. A negative result does not preclude SARS-Cov-2 infection and should not be used as the  sole basis for treatment or other patient management decisions. A negative result may occur with  improper specimen collection/handling, submission of specimen other than nasopharyngeal swab, presence of viral mutation(s) within the areas targeted by this assay, and inadequate number of viral copies (<131 copies/mL). A negative result must be combined with clinical observations, patient history, and epidemiological information. The expected result is Negative. Fact Sheet for Patients:  https://www.moore.com/ Fact Sheet for Healthcare Providers:  https://www.young.biz/ This test is not yet ap proved or cleared by the [place] FDA and  has been authorized for detection and/or diagnosis of SARS-CoV-2 by FDA under an Emergency Use Authorization (EUA). This  EUA will remain  in effect (meaning this test can be used) for the duration of the COVID-19 declaration under Section 564(b)(1) of the Act, 21 U.S.C. section 360bbb-3(b)(1), unless the authorization is terminated or revoked sooner.    Influenza A by PCR NEGATIVE NEGATIVE Final   Influenza B by PCR NEGATIVE NEGATIVE Final    Comment: (NOTE) The Xpert Xpress SARS-CoV-2/FLU/RSV assay is intended as an aid in  the diagnosis of influenza from Nasopharyngeal swab specimens and  should not be used as a sole basis for treatment. Nasal washings and  aspirates are unacceptable for Xpert Xpress SARS-CoV-2/FLU/RSV  testing. Fact Sheet for Patients: https://www.moore.com/ Fact Sheet for Healthcare Providers: https://www.young.biz/ This test is not yet approved or cleared by the [place] FDA and  has been authorized for detection and/or diagnosis of SARS-CoV-2 by  FDA under an Emergency Use Authorization (EUA). This EUA will remain  in effect (meaning this test can be used) for the duration of the  Covid-19 declaration under Section 564(b)(1) of the Act, 21  U.S.C. section 360bbb-3(b)(1), unless the authorization is  terminated or revoked. Performed at [facility], [address], [place], [place] [address]    Studies/Results: CT ABDOMEN PELVIS W CONTRAST  Result Date: 03/01/2019 CLINICAL DATA:  Inpatient. History of nonspecific gastric surgery with gastric outlet obstruction status post stricture dilation. EXAM: CT ABDOMEN AND PELVIS WITH CONTRAST TECHNIQUE: Multidetector CT imaging of the abdomen and pelvis was performed using the standard protocol following bolus administration of intravenous contrast. CONTRAST:  48mL OMNIPAQUE IOHEXOL 300 MG/ML  SOLN COMPARISON:  02/28/2019 upper GI.  06/02/2018 CT abdomen/pelvis. FINDINGS: Lower chest: Small dependent bilateral pleural effusions with minimal dependent bibasilar atelectasis. Coronary  atherosclerosis. Hepatobiliary: Normal liver size. No liver mass. Cholecystectomy. Bile ducts are stable and within normal post cholecystectomy limits with CBD diameter 8 mm. Pancreas: Normal, with no mass or duct dilation. Spleen: Normal size. No mass. Adrenals/Urinary Tract: Normal adrenals. No hydronephrosis. Simple 1.4 cm upper left renal cyst. No suspicious renal masses. Normal bladder. Stomach/Bowel: Stomach is mildly distended by oral contrast. A narrow gastrojejunostomy is identified along the greater curvature in the distal body/antrum with gastrojejunostomy luminal diameter approximately 0.8 cm (series 5/image 28). No definite gastric wall thickening. No appreciable gastric resection. There is an abrupt caliber transition between the first and second portions of the duodenum with collapsed second, third and fourth portions of the duodenum. No appreciable duodenal wall thickening. Normal caliber small bowel with no small bowel wall thickening. Dense oral contrast throughout the colon. Appendix not discretely visualized. No large bowel wall thickening, diverticulosis or pericolonic fat stranding. Vascular/Lymphatic: Atherosclerotic nonaneurysmal abdominal aorta. Patent portal, splenic, hepatic and renal veins. No pathologically enlarged lymph nodes in the abdomen or pelvis. Reproductive: Grossly normal uterus.  No adnexal mass. Other: No pneumoperitoneum. Trace free fluid in pelvis. No  focal fluid collections. Musculoskeletal: No aggressive appearing focal osseous lesions. Status post bilateral posterior spinal fusion L2-S1 with fusion of the L2-3 through L4-5 disc spaces. IMPRESSION: 1. Narrow gastrojejunostomy identified along the greater curvature in the distal body/antrum with 0.8 cm gastrojejunostomy anastomotic luminal diameter. Stomach mildly distended by oral contrast. No evidence of resection of any portions of the stomach. 2. Abrupt caliber transition between the first and second portions of the  duodenum, cannot exclude a stricture in this location. No gastric, duodenal, small or large bowel wall thickening. 3. Oral contrast transits to the left colon. No free air. No abscess. 4. Small dependent bilateral pleural effusions. 5.  Aortic Atherosclerosis (ICD10-I70.0). Electronically Signed   By: Ilona Sorrel M.D.   On: 03/01/2019 11:54   DG Abd Portable 2V  Result Date: 03/01/2019 CLINICAL DATA:  Gastric outlet obstruction. History of gastrojejunostomy vomiting and GI bleeding. Pyloric stenosis on recent upper endoscopy. EXAM: PORTABLE ABDOMEN - 2 VIEW COMPARISON:  Upper GI series 02/28/2019 FINDINGS: Stomach is nondistended. There is gaseous distention of small bowel and colon. The oral contrast material administered for upper GI series yesterday has migrated through to the colon with no evidence for residual gastric contrast at this time. Bones are diffusely demineralized. Patient is status post lumbosacral fusion. IMPRESSION: Oral contrast material seen in stomach and proximal small bowel on yesterday's study has migrated into the colon. Diffuse gaseous distention of large and small bowel similar to yesterday's exam. Electronically Signed   By: Misty Stanley M.D.   On: 03/01/2019 09:19   Medications:  I have reviewed the patient's current medications. Prior to Admission:  Medications Prior to Admission  Medication Sig Dispense Refill Last Dose  . amLODipine (NORVASC) 5 MG tablet Take 5 mg by mouth daily.    Past Week at Unknown time  . atorvastatin (LIPITOR) 20 MG tablet Take 20 mg by mouth at bedtime.    Past Week at Unknown time  . fentaNYL (DURAGESIC) 100 MCG/HR Place 1 patch onto the skin every 3 (three) days.   02/27/2019 at 0330  . folic acid (FOLVITE) 1 MG tablet Take 1 mg by mouth daily.    Past Week at Unknown time  . omeprazole (PRILOSEC) 20 MG capsule Take 20 mg by mouth 2 (two) times daily.    Past Week at Unknown time  . ondansetron (ZOFRAN) 4 MG tablet Take 1 tablet (4 mg total)  by mouth every 6 (six) hours as needed for nausea or vomiting. 20 tablet 1 prn at prn  . promethazine (PHENERGAN) 12.5 MG suppository Place 1 suppository (12.5 mg total) rectally every 6 (six) hours as needed for nausea or vomiting. 12 each 0 prn at prn   Scheduled: . sodium chloride   Intravenous Once  . sodium chloride   Intravenous Once  . feeding supplement  1 Container Oral TID BM  . fentaNYL  1 patch Transdermal Q72H  . pantoprazole  40 mg Oral BID  . pantoprazole (PROTONIX) IV  40 mg Intravenous Once   Continuous:  [id number], ondansetron **OR** ondansetron (ZOFRAN) IV, senna-docusate Anti-infectives (From admission, onward)   None     Scheduled Meds: . sodium chloride   Intravenous Once  . sodium chloride   Intravenous Once  . feeding supplement  1 Container Oral TID BM  . fentaNYL  1 patch Transdermal Q72H  . pantoprazole  40 mg Oral BID  . 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 Everlene Farrier who has discussed about the surgical revision with patient's daughter.  However, she is reluctant to have her mom undergo surgery given her age and functional capacity Alternate options such as stent placement at a tertiary care facility or repeat  EGD with dilation at regular intervals were personally discussed with patient's daughter She is interested in either options.   Long-term PPI twice daily Follow-up with GI as outpatient in a month  Normocytic anemia Recommend iron studies, B12 and folate panel  Plan to be discharged to SNF   LOS: 3 days   Rohini Vanga 03/02/2019, 4:33 PM

## 2019-03-02 NOTE — Discharge Summary (Signed)
[name]:[id number] DOB: [birth year] DOA: 02/27/2019  PCP: Marguarite Arbour, MD  Admit date: 02/27/2019 Discharge date: 03/02/2019  Admitted From: Home Disposition: Home  Recommendations for Outpatient Follow-up:  1. Follow up with PCP in 1 week 2. Please obtain BMP/CBC in one week 3. Please follow up on the following pending results: None 4. Gastroenterology Dr. Allegra Lai in 1 month 5. Follow-up Dr. Everlene Farrier general surgery in a few weeks  Home Health: None   Discharge Condition:Stable CODE STATUS: DNR Diet recommendation: Heart Healthy   Brief/Interim Summary: [name] is a 86 y.o. female with medical history significant for hypertension, chronic pain on chronic narcotics as well as history of gastrojejunostomy with gastric outlet obstruction status post dilatation on 07/29/2018, who was in her usual state of health until earlier in the day of arrival when she had a large bloody emesis.  She went on to have 4-5 melanotic stools during the day and according to daughter who gives most of the history, she slumped and almost passed out when she was trying to get off the commode but she was able to help her sit down and she did not fall.  By arrival in the emergency room she was awake and alert.  While in the ER her blood pressure was 125/48 any dropped to 94/41 improved with IV boluses.  Her Covid test was negative.Patient was started on IV Protonix and typed and crossed and transfused for 2 units of packed red blood cells.    She was transferred to ICU.  GI was consulted.  EGD was completed and it revealed EGD -Esophagitis with no bleed.  Postsurgical deformity in the pylorus.Gastric stenosis was found at the pylorus.  Dilated. Patient symptoms of emesis or from gastric outlet obstruction caused by pyloric stenosis.  Her gastric outlet obstruction General surgery Dr. Everlene Farrier was consulted.  After much discussion with daughter and patient they did not want to pursue any major surgical  intervention and they have opted for intermittent dilation endoscopy [place].  Per general surgery if endoscopic intervention fails gastric resection with Jillene Bucks gastrojejunostomy and feeding jejunostomy can be done.  Her blood pressure medications were also held due to low blood pressure in the hospital.  Her BP remained stable afterwards.  She was not sent home with BP meds as her blood pressure was normal without it.  She will need to follow-up with her primary care for further evaluation of this.  The episodes of upper GI bleeding and has tolerated her feeding.  She is GI recommended long-term PPI twice daily.  GI has cleared patient from GI standpoint to be discharged home with follow-up with GI in 1 month.  Patient is stable to be discharged home. Discharge Diagnoses:  Principal Problem:   Acute upper GI bleeding Active Problems:   Gastric outlet obstruction   Hypertension   Long term prescription opiate use   Acute blood loss anemia   History of bypass gastrojejunostomy   Acute upper GI bleed   Acute esophagitis   Complications of internal anastomosis of gastrointestinal tract    Discharge Instructions  Discharge Instructions    Call MD for:  persistant nausea and vomiting   Complete by: As directed    Diet - low sodium heart healthy   Complete by: As directed    Discharge instructions   Complete by: As directed    Follow-up with primary care in 1 week Follow-up with Dr. Birdena Jubilee general surgery Follow-up with Dr. Allegra Lai GI in  1 month   Increase activity slowly   Complete by: As directed      Allergies as of 03/02/2019      Reactions   Colesevelam Other (See Comments)   Lovastatin Other (See Comments)   Tetracyclines & Related Rash      Medication List    STOP taking these medications   amLODipine 5 MG tablet Commonly known as: NORVASC   omeprazole 20 MG capsule Commonly known as: PRILOSEC     TAKE these medications   atorvastatin 20 MG tablet Commonly known as:  LIPITOR Take 20 mg by mouth at bedtime.   fentaNYL 100 MCG/HR Commonly known as: DURAGESIC Place 1 patch onto the skin every 3 (three) days.   folic acid 1 MG tablet Commonly known as: FOLVITE Take 1 mg by mouth daily.   ondansetron 4 MG tablet Commonly known as: Zofran Take 1 tablet (4 mg total) by mouth every 6 (six) hours as needed for nausea or vomiting.   pantoprazole 40 MG tablet Commonly known as: PROTONIX Take 1 tablet (40 mg total) by mouth 2 (two) times daily.   promethazine 12.5 MG suppository Commonly known as: PHENERGAN Place 1 suppository (12.5 mg total) rectally every 6 (six) hours as needed for nausea or vomiting.      Follow-up Information    Pabon, HawaiiDiego F, MD Follow up in 4 week(s).   Specialty: General Surgery Contact information: [address] [place] [address] [phone]        Marguarite ArbourSparks, Jeffrey D, MD Follow up in 1 week(s).   Specialty: Internal Medicine Contact information: [address] [place] [address] [phone]        Toney ReilVanga, Rohini Reddy, MD Follow up in 1 month(s).   Specialty: Gastroenterology Contact information: [address] [place] [address] [phone]          Allergies  Allergen Reactions  . Colesevelam Other (See Comments)  . Lovastatin Other (See Comments)  . Tetracyclines & Related Rash    Consultations:  General surgery and gastroenterology   Procedures/Studies: CT ABDOMEN PELVIS W CONTRAST  Result Date: 03/01/2019 CLINICAL DATA:  Inpatient. History of nonspecific gastric surgery with gastric outlet obstruction status post stricture dilation. EXAM: CT ABDOMEN AND PELVIS WITH CONTRAST TECHNIQUE: Multidetector CT imaging of the abdomen and pelvis was performed using the standard protocol following bolus administration of intravenous contrast. CONTRAST:  80mL OMNIPAQUE IOHEXOL 300 MG/ML  SOLN COMPARISON:  02/28/2019 upper GI.  06/02/2018 CT abdomen/pelvis. FINDINGS:  Lower chest: Small dependent bilateral pleural effusions with minimal dependent bibasilar atelectasis. Coronary atherosclerosis. Hepatobiliary: Normal liver size. No liver mass. Cholecystectomy. Bile ducts are stable and within normal post cholecystectomy limits with CBD diameter 8 mm. Pancreas: Normal, with no mass or duct dilation. Spleen: Normal size. No mass. Adrenals/Urinary Tract: Normal adrenals. No hydronephrosis. Simple 1.4 cm upper left renal cyst. No suspicious renal masses. Normal bladder. Stomach/Bowel: Stomach is mildly distended by oral contrast. A narrow gastrojejunostomy is identified along the greater curvature in the distal body/antrum with gastrojejunostomy luminal diameter approximately 0.8 cm (series 5/image 28). No definite gastric wall thickening. No appreciable gastric resection. There is an abrupt caliber transition between the first and second portions of the duodenum with collapsed second, third and fourth portions of the duodenum. No appreciable duodenal wall thickening. Normal caliber small bowel with no small bowel wall thickening. Dense oral contrast throughout the colon. Appendix not discretely visualized. No large bowel wall thickening, diverticulosis or pericolonic fat stranding.  Vascular/Lymphatic: Atherosclerotic nonaneurysmal abdominal aorta. Patent portal, splenic, hepatic and renal veins. No pathologically enlarged lymph nodes in the abdomen or pelvis. Reproductive: Grossly normal uterus.  No adnexal mass. Other: No pneumoperitoneum. Trace free fluid in pelvis. No focal fluid collections. Musculoskeletal: No aggressive appearing focal osseous lesions. Status post bilateral posterior spinal fusion L2-S1 with fusion of the L2-3 through L4-5 disc spaces. IMPRESSION: 1. Narrow gastrojejunostomy identified along the greater curvature in the distal body/antrum with 0.8 cm gastrojejunostomy anastomotic luminal diameter. Stomach mildly distended by oral contrast. No evidence of  resection of any portions of the stomach. 2. Abrupt caliber transition between the first and second portions of the duodenum, cannot exclude a stricture in this location. No gastric, duodenal, small or large bowel wall thickening. 3. Oral contrast transits to the left colon. No free air. No abscess. 4. Small dependent bilateral pleural effusions. 5.  Aortic Atherosclerosis (ICD10-I70.0). Electronically Signed   By: Delbert Phenix M.D.   On: 03/01/2019 11:54   DG Chest Portable 1 View  Result Date: 02/27/2019 CLINICAL DATA:  86 year old female with nausea vomiting. EXAM: PORTABLE CHEST 1 VIEW COMPARISON:  Chest radiograph dated 08/03/2006. FINDINGS: Minimal bibasilar atelectasis. No focal consolidation, pleural effusion, pneumothorax. Stable cardiac silhouette. Atherosclerotic calcification of the aorta. No acute osseous pathology. IMPRESSION: No active cardiopulmonary disease. Electronically Signed   By: Elgie Collard M.D.   On: 02/27/2019 21:47   DG Abd Portable 2V  Result Date: 03/01/2019 CLINICAL DATA:  Gastric outlet obstruction. History of gastrojejunostomy vomiting and GI bleeding. Pyloric stenosis on recent upper endoscopy. EXAM: PORTABLE ABDOMEN - 2 VIEW COMPARISON:  Upper GI series 02/28/2019 FINDINGS: Stomach is nondistended. There is gaseous distention of small bowel and colon. The oral contrast material administered for upper GI series yesterday has migrated through to the colon with no evidence for residual gastric contrast at this time. Bones are diffusely demineralized. Patient is status post lumbosacral fusion. IMPRESSION: Oral contrast material seen in stomach and proximal small bowel on yesterday's study has migrated into the colon. Diffuse gaseous distention of large and small bowel similar to yesterday's exam. Electronically Signed   By: Kennith Center M.D.   On: 03/01/2019 09:19   DG UGI W SINGLE CM (SOL OR THIN BA)  Result Date: 02/28/2019 CLINICAL DATA:  History of GI bleed. Patient  underwent endoscopy today. Upper GI requested to evaluate gastric anatomy. History of gastrojejunostomy. EXAM: WATER SOLUBLE UPPER GI SERIES TECHNIQUE: Single-column upper GI series was performed using water soluble contrast. CONTRAST:  Omnipaque 300. COMPARISON:  06/03/1998 30. FLUOROSCOPY TIME:  Fluoroscopy Time:  3 minutes 0 seconds Radiation Exposure Index (if provided by the fluoroscopic device): 47.5 mGy FINDINGS: This was a limited exam due to the patient's age and post endoscopy state. Multiple loops of bowel distention noted, most likely from prior endoscopy, clinical correlation suggested. Patient has had prior lumbar fusion. Esophagus is widely patent. The stomach and what appears to be the duodenum is widely patent. No evidence of gastric or duodenal distention. A definite gastrojejunostomy anastomosis is not identified. No evidence of contrast extravasation on this limited exam. IMPRESSION: Very limited exam as above. Esophagus, stomach, and what appears to be the duodenum are widely patent. A definite gastrojejunostomy anastomosis is not identified. No evidence of gastric obstruction. Electronically Signed   By: Maisie Fus  Register   On: 02/28/2019 16:29       Subjective:   Discharge Exam: Vitals:   03/02/19 0506 03/02/19 0822  BP: (!) 116/53 (!) 119/55  Pulse: (!) 56 (!) 55  Resp:  16  Temp: 97.6 F (36.4 C) 97.8 F (36.6 C)  SpO2: 97% 96%   Vitals:   03/01/19 1442 03/01/19 2017 03/02/19 0506 03/02/19 0822  BP: 127/66 134/62 (!) 116/53 (!) 119/55  Pulse: (!) 55 (!) 54 (!) 56 (!) 55  Resp: 14 18  16   Temp: 97.9 F (36.6 C) 98 F (36.7 C) 97.6 F (36.4 C) 97.8 F (36.6 C)  TempSrc: Oral Oral Oral Oral  SpO2: 95% 96% 97% 96%  Weight:      Height:        General: Pt is alert, awake, not in acute distress Cardiovascular: RRR, S1/S2 +, no rubs, no gallops Respiratory: CTA bilaterally, no wheezing, no rhonchi Abdominal: Soft, NT, ND, bowel sounds + Extremities: no edema,  no cyanosis Neuro: Grossly intact   The results of significant diagnostics from this hospitalization (including imaging, microbiology, ancillary and laboratory) are listed below for reference.     Microbiology: Recent Results (from the past 240 hour(s))  MRSA PCR Screening     Status: None   Collection Time: 02/27/19  9:10 PM   Specimen: Nasopharyngeal  Result Value Ref Range Status   MRSA by PCR NEGATIVE NEGATIVE Final    Comment:        The GeneXpert MRSA Assay (FDA approved for NASAL specimens only), is one component of a comprehensive MRSA colonization surveillance program. It is not intended to diagnose MRSA infection nor to guide or monitor treatment for MRSA infections. Performed at [facility], [address] [place], [place] [address]   Respiratory Panel by RT PCR (Flu A&B, Covid) - Nasopharyngeal Swab     Status: None   Collection Time: 02/27/19  9:14 PM   Specimen: Nasopharyngeal Swab  Result Value Ref Range Status   SARS Coronavirus 2 by RT PCR NEGATIVE NEGATIVE Final    Comment: (NOTE) SARS-CoV-2 target nucleic acids are NOT DETECTED. The SARS-CoV-2 RNA is generally detectable in upper respiratoy specimens during the acute phase of infection. The lowest concentration of SARS-CoV-2 viral copies this assay can detect is 131 copies/mL. A negative result does not preclude SARS-Cov-2 infection and should not be used as the sole basis for treatment or other patient management decisions. A negative result may occur with  improper specimen collection/handling, submission of specimen other than nasopharyngeal swab, presence of viral mutation(s) within the areas targeted by this assay, and inadequate number of viral copies (<131 copies/mL). A negative result must be combined with clinical observations, patient history, and epidemiological information. The expected result is Negative. Fact Sheet for Patients:  PinkCheek.be Fact  Sheet for Healthcare Providers:  GravelBags.it This test is not yet ap proved or cleared by the [place] FDA and  has been authorized for detection and/or diagnosis of SARS-CoV-2 by FDA under an Emergency Use Authorization (EUA). This EUA will remain  in effect (meaning this test can be used) for the duration of the COVID-19 declaration under Section 564(b)(1) of the Act, 21 U.S.C. section 360bbb-3(b)(1), unless the authorization is terminated or revoked sooner.    Influenza A by PCR NEGATIVE NEGATIVE Final   Influenza B by PCR NEGATIVE NEGATIVE Final    Comment: (NOTE) The Xpert Xpress SARS-CoV-2/FLU/RSV assay is intended as an aid in  the diagnosis of influenza from Nasopharyngeal swab specimens and  should not be used as a sole basis for treatment. Nasal washings and  aspirates are unacceptable for Xpert Xpress SARS-CoV-2/FLU/RSV  testing. Fact Sheet  for Patients: https://www.moore.com/ Fact Sheet for Healthcare Providers: https://www.young.biz/ This test is not yet approved or cleared by the [place] FDA and  has been authorized for detection and/or diagnosis of SARS-CoV-2 by  FDA under an Emergency Use Authorization (EUA). This EUA will remain  in effect (meaning this test can be used) for the duration of the  Covid-19 declaration under Section 564(b)(1) of the Act, 21  U.S.C. section 360bbb-3(b)(1), unless the authorization is  terminated or revoked. Performed at [facility], [address] [place], [place] [address]      Labs: BNP (last 3 results) No results for input(s): BNP in the last 8760 hours. Basic Metabolic Panel: Recent Labs  Lab 02/27/19 2114 03/01/19 0429  NA 140 146*  K 4.0 3.3*  CL 113* 119*  CO2 21* 19*  GLUCOSE 168* 90  BUN 38* 20  CREATININE 0.56 0.62  CALCIUM 7.6* 8.4*   Liver Function Tests: Recent Labs  Lab 02/27/19 2114 03/01/19 0429  AST 14* 13*   ALT 13 11  ALKPHOS 35* 40  BILITOT 0.6 0.9  PROT 4.3* 5.0*  ALBUMIN 2.5* 2.8*   Recent Labs  Lab 02/27/19 2114  LIPASE 16   No results for input(s): AMMONIA in the last 168 hours. CBC: Recent Labs  Lab 02/27/19 2114 02/28/19 0129 03/01/19 0429 03/02/19 0658  WBC 6.8  --  4.6 3.9*  NEUTROABS 5.5  --   --  2.1  HGB 9.1* 12.6 11.8* 11.6*  HCT 28.5* 39.1 35.6* 35.0*  MCV 97.9  --  90.1 90.9  PLT 96*  --  83* 80*   Cardiac Enzymes: No results for input(s): CKTOTAL, CKMB, CKMBINDEX, TROPONINI in the last 168 hours. BNP: Invalid input(s): POCBNP CBG: Recent Labs  Lab 02/28/19 1525  GLUCAP 73   D-Dimer No results for input(s): DDIMER in the last 72 hours. Hgb A1c No results for input(s): HGBA1C in the last 72 hours. Lipid Profile No results for input(s): CHOL, HDL, LDLCALC, TRIG, CHOLHDL, LDLDIRECT in the last 72 hours. Thyroid function studies No results for input(s): TSH, T4TOTAL, T3FREE, THYROIDAB in the last 72 hours.  Invalid input(s): FREET3 Anemia work up No results for input(s): VITAMINB12, FOLATE, FERRITIN, TIBC, IRON, RETICCTPCT in the last 72 hours. Urinalysis    Component Value Date/Time   COLORURINE YELLOW (A) 06/02/2018 2344   APPEARANCEUR HAZY (A) 06/02/2018 2344   LABSPEC 1.042 (H) 06/02/2018 2344   PHURINE 7.0 06/02/2018 2344   GLUCOSEU NEGATIVE 06/02/2018 2344   HGBUR MODERATE (A) 06/02/2018 2344   BILIRUBINUR NEGATIVE 06/02/2018 2344   KETONESUR 5 (A) 06/02/2018 2344   PROTEINUR 30 (A) 06/02/2018 2344   UROBILINOGEN 0.2 08/03/2006 1158   NITRITE NEGATIVE 06/02/2018 2344   LEUKOCYTESUR NEGATIVE 06/02/2018 2344   Sepsis Labs Invalid input(s): PROCALCITONIN,  WBC,  LACTICIDVEN Microbiology Recent Results (from the past 240 hour(s))  MRSA PCR Screening     Status: None   Collection Time: 02/27/19  9:10 PM   Specimen: Nasopharyngeal  Result Value Ref Range Status   MRSA by PCR NEGATIVE NEGATIVE Final    Comment:        The GeneXpert  MRSA Assay (FDA approved for NASAL specimens only), is one component of a comprehensive MRSA colonization surveillance program. It is not intended to diagnose MRSA infection nor to guide or monitor treatment for MRSA infections. Performed at [facility], [address], [place], [place] [address]   Respiratory Panel by RT PCR (Flu A&B, Covid) -  Nasopharyngeal Swab     Status: None   Collection Time: 02/27/19  9:14 PM   Specimen: Nasopharyngeal Swab  Result Value Ref Range Status   SARS Coronavirus 2 by RT PCR NEGATIVE NEGATIVE Final    Comment: (NOTE) SARS-CoV-2 target nucleic acids are NOT DETECTED. The SARS-CoV-2 RNA is generally detectable in upper respiratoy specimens during the acute phase of infection. The lowest concentration of SARS-CoV-2 viral copies this assay can detect is 131 copies/mL. A negative result does not preclude SARS-Cov-2 infection and should not be used as the sole basis for treatment or other patient management decisions. A negative result may occur with  improper specimen collection/handling, submission of specimen other than nasopharyngeal swab, presence of viral mutation(s) within the areas targeted by this assay, and inadequate number of viral copies (<131 copies/mL). A negative result must be combined with clinical observations, patient history, and epidemiological information. The expected result is Negative. Fact Sheet for Patients:  https://www.moore.com/https://www.fda.gov/media/142436/download Fact Sheet for Healthcare Providers:  https://www.young.biz/https://www.fda.gov/media/142435/download This test is not yet ap proved or cleared by the [place] FDA and  has been authorized for detection and/or diagnosis of SARS-CoV-2 by FDA under an Emergency Use Authorization (EUA). This EUA will remain  in effect (meaning this test can be used) for the duration of the COVID-19 declaration under Section 564(b)(1) of the Act, 21 U.S.C. section 360bbb-3(b)(1), unless the  authorization is terminated or revoked sooner.    Influenza A by PCR NEGATIVE NEGATIVE Final   Influenza B by PCR NEGATIVE NEGATIVE Final    Comment: (NOTE) The Xpert Xpress SARS-CoV-2/FLU/RSV assay is intended as an aid in  the diagnosis of influenza from Nasopharyngeal swab specimens and  should not be used as a sole basis for treatment. Nasal washings and  aspirates are unacceptable for Xpert Xpress SARS-CoV-2/FLU/RSV  testing. Fact Sheet for Patients: https://www.moore.com/https://www.fda.gov/media/142436/download Fact Sheet for Healthcare Providers: https://www.young.biz/https://www.fda.gov/media/142435/download This test is not yet approved or cleared by the [place] FDA and  has been authorized for detection and/or diagnosis of SARS-CoV-2 by  FDA under an Emergency Use Authorization (EUA). This EUA will remain  in effect (meaning this test can be used) for the duration of the  Covid-19 declaration under Section 564(b)(1) of the Act, 21  U.S.C. section 360bbb-3(b)(1), unless the authorization is  terminated or revoked. Performed at [facility], [address] [place], [place] [address]      Time coordinating discharge: Over 30 minutes  SIGNED:   Lynn ItoSahar Harlean Regula, MD  [facility] 03/02/2019, 3:01 PM Pager   If 7PM-7AM, please contact night-coverage [email or web address] Password [email or web address]

## 2019-03-02 NOTE — TOC Transition Note (Signed)
Transition of Care [facility]) - CM/SW Discharge Note   Patient Details  Name: [name] MRN: [id number] Date of Birth: [birth year]  [facility]) CM/SW Contact:  Shawn Route, RN Phone Number: 03/02/2019, 4:25 PM   Clinical Narrative:     Spoke with Patient's Daughter Junious Dresser.  They are agreeable to [facility] after discharge.  The requested [facility], patient reports they have used them in the past.  [facility] is unable to meet these services at this time.  [facility] will provide [facility] services to patient at discharge.  Patient will be discharging home to her sister's house, while she still requires more needs.  Daughter and sister will care for her for the next week or so.  Daughter will be driving patient at discharge.   Final next level of care: Home w Home Health Services Barriers to Discharge: Barriers Resolved   Patient Goals and CMS Choice        Discharge Placement                       Discharge Plan and Services                DME Arranged: N/A DME Agency: NA       HH Arranged: PT HH Agency: [facility] Date HH Agency Contacted: 03/02/19 Time HH Agency Contacted: 1622 Representative spoke with at [facility] Agency: [place]  Social Determinants of Health (SDOH) Interventions     Readmission Risk Interventions Readmission Risk Prevention Plan 06/08/2018  Post Dischage Appt Complete  Medication Screening Complete  Transportation Screening Complete  Some recent data might be hidden

## 2019-03-05 ENCOUNTER — Telehealth: Payer: Self-pay | Admitting: Gastroenterology

## 2019-03-05 ENCOUNTER — Telehealth: Payer: Self-pay

## 2019-03-05 NOTE — TOC Progression Note (Signed)
Transition of Care [facility]) - Progression Note    Patient Details  Name: [name] MRN: [id number] Date of Birth: 05-Jun-1932  Transition [facility]) CM/SW Contact  Shawn Route, RN Phone Number: 03/05/2019, 9:15 AM  Clinical Narrative:     Patients daughter called today and reported she has not heard from Home Care agency, she asked that I pass her cell number to company as her mother is staying with her at this time.   [facility]ed [facility] and gave daughters number   Barriers to Discharge: Barriers Resolved  Expected Discharge Plan and Services           Expected Discharge Date: 03/02/19               DME Arranged: N/A DME Agency: NA       HH Arranged: PT HH Agency: [facility] Date HH Agency Contacted: 03/02/19 Time HH Agency Contacted: 1622 Representative spoke with at [facility] Agency: [place]   Social Determinants of Health (SDOH) Interventions    Readmission Risk Interventions Readmission Risk Prevention Plan 06/08/2018  Post Dischage Appt Complete  Medication Screening Complete  Transportation Screening Complete  Some recent data might be hidden

## 2019-03-05 NOTE — Telephone Encounter (Signed)
Pt is scheduled  For Ed f/u  Pt daughter would like for [place] to e-mail a soft diet for pt to eat to e-mail [email or web address]

## 2019-03-05 NOTE — Telephone Encounter (Signed)
Below is the link for soft diet which is useful Message her on MyChart if she has one. Otherwise, her daughter can pick up a copy   [email or web address]  Thanks RV

## 2019-03-05 NOTE — Telephone Encounter (Signed)
Please recommend a soft diet for pt.

## 2019-03-06 NOTE — Telephone Encounter (Signed)
Spoke with pt's daughter Junious Dresser and soft diet information has been faxed to daughter.

## 2019-03-31 ENCOUNTER — Other Ambulatory Visit: Payer: Self-pay

## 2019-03-31 ENCOUNTER — Ambulatory Visit (INDEPENDENT_AMBULATORY_CARE_PROVIDER_SITE_OTHER): Payer: Medicare PPO | Admitting: Gastroenterology

## 2019-03-31 ENCOUNTER — Encounter: Payer: Self-pay | Admitting: Gastroenterology

## 2019-03-31 VITALS — BP 112/57 | HR 81 | Temp 98.6°F | Resp 17 | Wt 112.0 lb

## 2019-03-31 DIAGNOSIS — K9189 Other postprocedural complications and disorders of digestive system: Secondary | ICD-10-CM | POA: Diagnosis not present

## 2019-03-31 DIAGNOSIS — Z980 Intestinal bypass and anastomosis status: Secondary | ICD-10-CM | POA: Diagnosis not present

## 2019-03-31 MED ORDER — PANTOPRAZOLE SODIUM 40 MG PO TBEC: 40.0000 mg | DELAYED_RELEASE_TABLET | Freq: Two times a day (BID) | ORAL | 3 refills | Status: DC

## 2019-03-31 NOTE — Progress Notes (Signed)
Brittany Repress, MD [address]  [address]  [place], [place] [address]  Main: [phone]  Fax: [phone]    Gastroenterology Consultation  Referring Provider:     Marguarite Arbour, MD Primary Care Physician:  Marguarite Arbour, MD Primary Gastroenterologist:  Dr. Maximino Greenland Reason for Consultation:   Gastrojejunal anastomotic stricture        HPI:   [name] is a 86 y.o. female referred by Dr. 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 Patient has history of gastrojejunostomy by Dr. Lemar Livings in 2011.  She subsequently developed anastomotic stricture that resulted in partial gastric outlet obstruction that required dilation in 06/05/2018 and most recently again when she was admitted to [facility] in 02/2019 to 35mm.  Patient recovered well and transition to soft food, tolerated well.  She also has chronic constipation Patient has been evaluated by Dr. Everlene Farrier and previously by Dr. Aleen Campi and patient and her daughter are reluctant to undergo surgical resection given her age.  Interval summary Patient's weight is 112 pounds which has been stable and slightly improved.  Patient is tolerating soft diet, well managed by her daughter.  She does have irregular bowel habits for which she takes fiber supplements.  Patient denies abdominal pain, nausea, vomiting, bloating.  She is working with physical therapy and able to ambulate to a limited extent Patient is currently taking Protonix 40 mg 2 times a day  NSAIDs: None  Antiplts/Anticoagulants/Anti thrombotics: None  GI Procedures:  EGD 02/28/2019 EGD 06/05/2018 EGD 06/04/2018  Past Medical History:  Diagnosis Date  . Acute esophagitis   . GERD (gastroesophageal reflux disease)   . High cholesterol   . Hypertension   . Ileus ([facility]) 06/02/2018  . Long term prescription opiate use 08/22/2017    Past Surgical History:  Procedure Laterality  Date  . ABDOMINAL SURGERY    . BACK SURGERY    . CHOLECYSTECTOMY    . ESOPHAGOGASTRODUODENOSCOPY N/A 06/04/2018   Procedure: ESOPHAGOGASTRODUODENOSCOPY (EGD);  Surgeon: Pasty Spillers, MD;  Location: [facility] ENDOSCOPY;  Service: Endoscopy;  Laterality: N/A;  . ESOPHAGOGASTRODUODENOSCOPY N/A 06/05/2018   Procedure: ESOPHAGOGASTRODUODENOSCOPY (EGD);  Surgeon: Midge Minium, MD;  Location[facility] ENDOSCOPY;  Service: Endoscopy;  Laterality: N/A;  use Fluoro bed, X-ray is aware  . ESOPHAGOGASTRODUODENOSCOPY N/A 02/28/2019   Procedure: ESOPHAGOGASTRODUODENOSCOPY (EGD);  Surgeon: Pasty Spillers, MD;  Location: [facility] ENDOSCOPY;  Service: Endoscopy;  Laterality: N/A;  . JOINT REPLACEMENT    . REPLACEMENT TOTAL KNEE Right   . TOE AMPUTATION Left    great toe  . TONSILLECTOMY      Current Outpatient Medications:  .  atorvastatin (LIPITOR) 20 MG tablet, Take 20 mg by mouth at bedtime. , Disp: , Rfl:  .  fentaNYL (DURAGESIC) 100 MCG/HR, Place 1 patch onto the skin every 3 (three) days., Disp: , Rfl:  .  folic acid (FOLVITE) 1 MG tablet, Take 1 mg by mouth daily. , Disp: , Rfl:  .  pantoprazole (PROTONIX) 40 MG tablet, Take 1 tablet (40 mg total) by mouth 2 (two) times daily before a meal., Disp: 180 tablet, Rfl: 3 .  promethazine (PHENERGAN) 12.5 MG suppository, Place 1 suppository (12.5 mg total) rectally every 6 (six) hours as needed for nausea or vomiting., Disp: 12 each, Rfl: 0 .  Calcium Polycarbophil (FIBER) 625 MG TABS, Take by mouth., Disp: , Rfl:    No family history on file.   Social  History   Tobacco Use  . Smoking status: Never Smoker  . Smokeless tobacco: Never Used  Substance Use Topics  . Alcohol use: Never  . Drug use: Never    Allergies as of 03/31/2019 - Review Complete 03/31/2019  Allergen Reaction Noted  . Colesevelam Other (See Comments) 06/02/2018  . Lovastatin Other (See Comments) 06/02/2018  . Tetracyclines & related Rash 06/02/2018    Review of Systems:      All systems reviewed and negative except where noted in HPI.   Physical Exam:  BP (!) 112/57 (BP Location: Left Arm, Patient Position: Sitting, Cuff Size: Normal)   Pulse 81   Temp 98.6 F (37 C)   Resp 17   Wt 112 lb (50.8 kg)   BMI 19.84 kg/m  No LMP recorded. Patient is postmenopausal.  General:   Alert, moderately built, moderately nourished, pleasant and cooperative in NAD Head:  Normocephalic and atraumatic. Eyes:  Sclera clear, no icterus.   Conjunctiva pink. Ears:  Normal auditory acuity. Lungs:  Respirations even and unlabored.  Clear throughout to auscultation.   No wheezes, crackles, or rhonchi. No acute distress. Heart:  Regular rate and rhythm; no murmurs, clicks, rubs, or gallops. Abdomen:  Normal bowel sounds. Soft, scaphoid, non-tender and mildly-distended, tympanic without masses, hepatosplenomegaly or hernias noted.  No guarding or rebound tenderness.   Rectal: Not performed Msk:  Symmetrical without gross deformities. Good, equal movement & strength bilaterally. Pulses:  Normal pulses noted. Extremities:  No clubbing or edema.  No cyanosis. Neurologic:  Alert and oriented x3;  grossly normal neurologically. Skin:  Intact without significant lesions or rashes. No jaundice. Lymph Nodes:  No significant cervical adenopathy. Psych:  Alert and cooperative. Normal mood and affect.  Imaging Studies: Reviewed  Assessment and Plan:   [name] is a 86 y.o. 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  Patient is also evaluated by general surgery for resection/revision of the gastrojejunal anastomotic stricture and she is reluctant to undergo surgery at this time.  I discussed with both patient and her daughter that the next option is intermittent dilation as needed in order to prevent hospitalizations and flareups.  Both feel comfortable at  this approach   Discussed about repeat EGD with further dilation next 2 weeks Patient received first COVID-19 vaccine in first week of Feb Continue Protonix 40 mg 2 times a day before meals Continue soft foods, discussed few options Continue soluble fiber with adequate intake of water to have soft bowel movements at least every 2 to 3 days   Follow up in 2 to 3 months   Cephas Darby, MD

## 2019-04-01 ENCOUNTER — Ambulatory Visit: Payer: Medicare PPO | Admitting: Gastroenterology

## 2019-04-04 ENCOUNTER — Other Ambulatory Visit
Admission: RE | Admit: 2019-04-04 | Discharge: 2019-04-04 | Disposition: A | Discharge status: Home / Self Care | Payer: Medicare PPO | Source: Ambulatory Visit | Attending: Gastroenterology | Admitting: Gastroenterology

## 2019-04-04 ENCOUNTER — Other Ambulatory Visit: Payer: Self-pay

## 2019-04-04 DIAGNOSIS — Z20822 Contact with and (suspected) exposure to covid-19: Secondary | ICD-10-CM | POA: Diagnosis not present

## 2019-04-04 DIAGNOSIS — Z01812 Encounter for preprocedural laboratory examination: Secondary | ICD-10-CM | POA: Diagnosis present

## 2019-04-05 LAB — SARS CORONAVIRUS 2 (TAT 6-24 HRS): SARS Coronavirus 2: NEGATIVE

## 2019-04-07 ENCOUNTER — Encounter: Payer: Self-pay | Admitting: Gastroenterology

## 2019-04-07 ENCOUNTER — Other Ambulatory Visit: Payer: Self-pay

## 2019-04-07 DIAGNOSIS — K9189 Other postprocedural complications and disorders of digestive system: Secondary | ICD-10-CM

## 2019-04-08 ENCOUNTER — Encounter
Admission: RE | Disposition: A | Discharge status: Home / Self Care | Payer: Self-pay | Source: Home / Self Care | Attending: Gastroenterology

## 2019-04-08 ENCOUNTER — Ambulatory Visit
Admission: RE | Admit: 2019-04-08 | Discharge: 2019-04-08 | Disposition: A | Discharge status: Home / Self Care | Payer: Medicare PPO | Attending: Gastroenterology | Admitting: Gastroenterology

## 2019-04-08 ENCOUNTER — Ambulatory Visit: Payer: Medicare PPO | Admitting: Anesthesiology

## 2019-04-08 ENCOUNTER — Encounter: Payer: Self-pay | Admitting: Gastroenterology

## 2019-04-08 DIAGNOSIS — K319 Disease of stomach and duodenum, unspecified: Secondary | ICD-10-CM | POA: Insufficient documentation

## 2019-04-08 DIAGNOSIS — K2100 Gastro-esophageal reflux disease with esophagitis, without bleeding: Secondary | ICD-10-CM | POA: Insufficient documentation

## 2019-04-08 DIAGNOSIS — I10 Essential (primary) hypertension: Secondary | ICD-10-CM | POA: Insufficient documentation

## 2019-04-08 DIAGNOSIS — K9189 Other postprocedural complications and disorders of digestive system: Secondary | ICD-10-CM | POA: Insufficient documentation

## 2019-04-08 DIAGNOSIS — E7800 Pure hypercholesterolemia, unspecified: Secondary | ICD-10-CM | POA: Diagnosis not present

## 2019-04-08 DIAGNOSIS — E119 Type 2 diabetes mellitus without complications: Secondary | ICD-10-CM | POA: Insufficient documentation

## 2019-04-08 DIAGNOSIS — Z96651 Presence of right artificial knee joint: Secondary | ICD-10-CM | POA: Diagnosis not present

## 2019-04-08 DIAGNOSIS — Z980 Intestinal bypass and anastomosis status: Secondary | ICD-10-CM | POA: Diagnosis not present

## 2019-04-08 DIAGNOSIS — Z89412 Acquired absence of left great toe: Secondary | ICD-10-CM | POA: Diagnosis not present

## 2019-04-08 HISTORY — DX: Presence of external hearing-aid: Z97.4

## 2019-04-08 HISTORY — DX: Other symptoms and signs involving the musculoskeletal system: R29.898

## 2019-04-08 HISTORY — DX: Dependence on other enabling machines and devices: Z99.89

## 2019-04-08 HISTORY — PX: ESOPHAGOGASTRODUODENOSCOPY (EGD) WITH PROPOFOL: SHX5813

## 2019-04-08 HISTORY — PX: BIOPSY: SHX5522

## 2019-04-08 HISTORY — DX: Dizziness and giddiness: R42

## 2019-04-08 SURGERY — ESOPHAGOGASTRODUODENOSCOPY (EGD) WITH PROPOFOL
Anesthesia: General | Site: Throat

## 2019-04-08 MED ORDER — GLYCOPYRROLATE 0.2 MG/ML IJ SOLN
INTRAMUSCULAR | Status: DC | PRN
  Administered 2019-04-08: .1 mg via INTRAVENOUS

## 2019-04-08 MED ORDER — STERILE WATER FOR IRRIGATION IR SOLN
Status: DC | PRN
  Administered 2019-04-08: 14:00:00 25 mL

## 2019-04-08 MED ORDER — PROPOFOL 10 MG/ML IV BOLUS
INTRAVENOUS | Status: DC | PRN
  Administered 2019-04-08: 50 mg via INTRAVENOUS
  Administered 2019-04-08: 20 mg via INTRAVENOUS
  Administered 2019-04-08 (×4): 10 mg via INTRAVENOUS
  Administered 2019-04-08: 20 mg via INTRAVENOUS

## 2019-04-08 MED ORDER — ONDANSETRON HCL 4 MG/2ML IJ SOLN: 4.0000 mg | Freq: Once | INTRAMUSCULAR | Status: DC | PRN

## 2019-04-08 MED ORDER — LIDOCAINE HCL (CARDIAC) PF 100 MG/5ML IV SOSY
PREFILLED_SYRINGE | INTRAVENOUS | Status: DC | PRN
  Administered 2019-04-08: 40 mg via INTRAVENOUS

## 2019-04-08 MED ORDER — LACTATED RINGERS IV SOLN: INTRAVENOUS | Status: DC

## 2019-04-08 SURGICAL SUPPLY — 33 items
BALLN DILATOR 10-12 8 (BALLOONS)
BALLN DILATOR 12-15 8 (BALLOONS)
BALLN DILATOR 15-18 8 (BALLOONS)
BALLN DILATOR CRE 0-12 8 (BALLOONS)
BALLN DILATOR ESOPH 8 10 CRE (MISCELLANEOUS) IMPLANT
BALLOON DILATOR 12-15 8 (BALLOONS) IMPLANT
BALLOON DILATOR 15-18 8 (BALLOONS) IMPLANT
BALLOON DILATOR CRE 0-12 8 (BALLOONS) IMPLANT
BLOCK BITE 60FR ADLT L/F GRN (MISCELLANEOUS) ×2 IMPLANT
CANISTER SUCT 1200ML W/VALVE (MISCELLANEOUS) ×2 IMPLANT
CLIP HMST 235XBRD CATH ROT (MISCELLANEOUS) IMPLANT
CLIP RESOLUTION 360 11X235 (MISCELLANEOUS)
ELECT REM PT RETURN 9FT ADLT (ELECTROSURGICAL)
ELECTRODE REM PT RTRN 9FT ADLT (ELECTROSURGICAL) IMPLANT
FCP ESCP3.2XJMB 240X2.8X (MISCELLANEOUS)
FORCEPS BIOP RAD 4 LRG CAP 4 (CUTTING FORCEPS) IMPLANT
FORCEPS BIOP RJ4 240 W/NDL (MISCELLANEOUS)
FORCEPS ESCP3.2XJMB 240X2.8X (MISCELLANEOUS) IMPLANT
GOWN CVR UNV OPN BCK APRN NK (MISCELLANEOUS) ×2 IMPLANT
GOWN ISOL THUMB LOOP REG UNIV (MISCELLANEOUS) ×2
INJECTOR VARIJECT VIN23 (MISCELLANEOUS) IMPLANT
KIT DEFENDO VALVE AND CONN (KITS) IMPLANT
KIT ENDO PROCEDURE OLY (KITS) ×2 IMPLANT
MARKER SPOT ENDO TATTOO 5ML (MISCELLANEOUS) IMPLANT
RETRIEVER NET PLAT FOOD (MISCELLANEOUS) IMPLANT
SNARE SHORT THROW 13M SML OVAL (MISCELLANEOUS) IMPLANT
SNARE SHORT THROW 30M LRG OVAL (MISCELLANEOUS) IMPLANT
SPOT EX ENDOSCOPIC TATTOO (MISCELLANEOUS)
SYR INFLATION 60ML (SYRINGE) IMPLANT
TRAP ETRAP POLY (MISCELLANEOUS) IMPLANT
VARIJECT INJECTOR VIN23 (MISCELLANEOUS)
WATER STERILE IRR 250ML POUR (IV SOLUTION) ×2 IMPLANT
WIRE CRE 18-20MM 8CM F G (MISCELLANEOUS) IMPLANT

## 2019-04-08 NOTE — Anesthesia Postprocedure Evaluation (Signed)
Anesthesia Post Note  Patient: [name]  Procedure(s) Performed: ESOPHAGOGASTRODUODENOSCOPY (EGD) WITH PROPOFOL (N/A Throat) BIOPSY (N/A Throat)     Patient location during evaluation: PACU Anesthesia Type: General Level of consciousness: awake and alert Pain management: pain level controlled Vital Signs Assessment: post-procedure vital signs reviewed and stable Respiratory status: spontaneous breathing, nonlabored ventilation, respiratory function stable and patient connected to nasal cannula oxygen Cardiovascular status: blood pressure returned to baseline and stable Postop Assessment: no apparent nausea or vomiting Anesthetic complications: no    Scarlette Slice

## 2019-04-08 NOTE — Op Note (Signed)
[facility] Gastroenterology Patient Name: [name] Procedure Date: 04/08/2019 1:12 PM MRN: [id number] Account #: [id number] Date of Birth: [birth year] Admit Type: Outpatient Age: 86 Room: [facility] OR ROOM 01 Gender: Female Note Status: Finalized Procedure:             Upper GI endoscopy Indications:           Post-surgical anastomotic stenosis, Follow-up of                         post-surgical anastomotic stenosis, For therapy of                         post-surgical anastomotic stenosis Providers:             Lin Landsman MD, MD Referring MD:          Leonie Douglas. Doy Hutching, MD (Referring MD) Medicines:             Monitored Anesthesia Care Complications:         No immediate complications. Estimated blood loss: None. Procedure:             Pre-Anesthesia Assessment:                        - Prior to the procedure, a History and Physical was                         performed, and patient medications and allergies were                         reviewed. The patient is competent. The risks and                         benefits of the procedure and the sedation options and                         risks were discussed with the patient. All questions                         were answered and informed consent was obtained.                         Patient identification and proposed procedure were                         verified by the physician, the nurse, the                         anesthesiologist, the anesthetist and the technician                         in the pre-procedure area in the procedure room in the                         endoscopy suite. Mental Status Examination: alert and                         oriented. Airway Examination: normal oropharyngeal  airway and neck mobility. Respiratory Examination:                         clear to auscultation. CV Examination: normal.                         Prophylactic Antibiotics: The patient  does not require                         prophylactic antibiotics. Prior Anticoagulants: The                         patient has taken no previous anticoagulant or                         antiplatelet agents. ASA Grade Assessment: III - A                         patient with severe systemic disease. After reviewing                         the risks and benefits, the patient was deemed in                         satisfactory condition to undergo the procedure. The                         anesthesia plan was to use monitored anesthesia care                         (MAC). Immediately prior to administration of                         medications, the patient was re-assessed for adequacy                         to receive sedatives. The heart rate, respiratory                         rate, oxygen saturations, blood pressure, adequacy of                         pulmonary ventilation, and response to care were                         monitored throughout the procedure. The physical                         status of the patient was re-assessed after the                         procedure.                        After obtaining informed consent, the endoscope was                         passed under direct vision. Throughout the procedure,  the patient's blood pressure, pulse, and oxygen                         saturations were monitored continuously. The was                         introduced through the mouth, and advanced to the                         second part of duodenum. The upper GI endoscopy was                         accomplished without difficulty. The patient tolerated                         the procedure well. Findings:      Evidence of a Billroth II gastrojejunostomy was found. The gastrojejunal       anastomosis was characterized by friable mucosa. There was no stenosis       or stricture, rather there was an acute angulation of the anastomosis.       This  was traversed with adult endoscope withut any resistance. The       efferent limb was examined. It appeared normal      A medium amount of food (residue) was found in the gastric fundus and in       the gastric body.      The entire examined stomach was normal. Biopsies were taken with a cold       forceps for Helicobacter pylori testing.      LA Grade A (one or more mucosal breaks less than 5 mm, not extending       between tops of 2 mucosal folds) esophagitis with no bleeding was found       in the mid esophagus.      Esophagogastric landmarks were identified: the gastroesophageal junction       was found at 35 cm from the incisors. Impression:            - Stenosed Billroth II gastrojejunostomy was found,                         characterized by friable mucosa.                        - A medium amount of food (residue) in the stomach.                        - Normal stomach. Biopsied.                        - LA Grade A reflux esophagitis with no bleeding.                        - Esophagogastric landmarks identified. Recommendation:        - Await pathology results.                        - Discharge patient to home (with escort).                        - Full  liquid diet and pureed diet indefinitely.                        - Continue present medications.                        - Return to my office in 3 months. Procedure Code(s):     --- Professional ---                        [phone], Esophagogastroduodenoscopy, flexible,                         transoral; with biopsy, single or multiple Diagnosis Code(s):     --- Professional ---                        Z98.0, Intestinal bypass and anastomosis status                        K21.00, Gastro-esophageal reflux disease with                         esophagitis, without bleeding                        K91.89, Other postprocedural complications and                         disorders of digestive system CPT copyright 2019 American Medical  Association. All rights reserved. The codes documented in this report are preliminary and upon coder review may  be revised to meet current compliance requirements. Dr. Ulyess Mort Lin Landsman MD, MD 04/08/2019 2:02:16 PM This report has been signed electronically. Number of Addenda: 0 Note Initiated On: 04/08/2019 1:12 PM Total Procedure Duration: 0 hours 9 minutes 38 seconds  Estimated Blood Loss:  Estimated blood loss: none.      [facility]

## 2019-04-08 NOTE — H&P (Signed)
Brittany Darby, MD [address]  [place]  [place], [place] [address]  Main: [phone]  Fax: [phone] Pager: [phone]  Primary Care Physician:  Idelle Crouch, MD Primary Gastroenterologist:  Dr. Cephas [name]  Pre-Procedure History & Physical: HPI:  [name] is a 86 y.o. female is here for an endoscopy.   Past Medical History:  Diagnosis Date  . Acute esophagitis   . GERD (gastroesophageal reflux disease)   . High cholesterol   . Hypertension   . Ileus ([place]) 06/02/2018  . Long term prescription opiate use 08/22/2017  . Uses walker   . Vertigo    long ago.  no current issues  . Weakness of left foot   . Wears hearing aid in both ears     Past Surgical History:  Procedure Laterality Date  . ABDOMINAL SURGERY    . BACK SURGERY    . CHOLECYSTECTOMY    . ESOPHAGOGASTRODUODENOSCOPY N/A 06/04/2018   Procedure: ESOPHAGOGASTRODUODENOSCOPY (EGD);  Surgeon: Virgel Manifold, MD;  Location: [facility] ENDOSCOPY;  Service: Endoscopy;  Laterality: N/A;  . ESOPHAGOGASTRODUODENOSCOPY N/A 06/05/2018   Procedure: ESOPHAGOGASTRODUODENOSCOPY (EGD);  Surgeon: Lucilla Lame, MD;  Location: [facility] ENDOSCOPY;  Service: Endoscopy;  Laterality: N/A;  use Fluoro bed, X-ray is aware  . ESOPHAGOGASTRODUODENOSCOPY N/A 02/28/2019   Procedure: ESOPHAGOGASTRODUODENOSCOPY (EGD);  Surgeon: Virgel Manifold, MD;  Location: [facility] ENDOSCOPY;  Service: Endoscopy;  Laterality: N/A;  . JOINT REPLACEMENT    . REPLACEMENT TOTAL KNEE Right   . TOE AMPUTATION Left    great toe  . TONSILLECTOMY      Prior to Admission medications   Medication Sig Start Date End Date Taking? Authorizing Provider  atorvastatin (LIPITOR) 20 MG tablet Take 20 mg by mouth at bedtime.  09/12/16  Yes [provider]  Calcium Polycarbophil (FIBER) 625 MG TABS Take by mouth. 03/11/19 03/10/20 Yes [provider]  Cyanocobalamin (VITAMIN B-12 PO) Take by mouth daily.   Yes [provider]    fentaNYL (DURAGESIC) 100 MCG/HR Place 1 patch onto the skin every 3 (three) days. 03/12/18  Yes [provider]  folic acid (FOLVITE) 1 MG tablet Take 1 mg by mouth daily.  04/10/18  Yes [provider]  geriatric multivitamins-minerals (ELDERTONIC/GEVRABON) LIQD Take by mouth daily.   Yes [provider]  pantoprazole (PROTONIX) 40 MG tablet Take 1 tablet (40 mg total) by mouth 2 (two) times daily before a meal. 03/31/19 06/29/19 Yes Vielka Klinedinst, Tally Due, MD  promethazine (PHENERGAN) 12.5 MG suppository Place 1 suppository (12.5 mg total) rectally every 6 (six) hours as needed for nausea or vomiting. Patient not taking: Reported on 04/07/2019 02/27/19   Lucilla Lame, MD    Allergies as of 04/07/2019 - Review Complete 04/07/2019  Allergen Reaction Noted  . Colesevelam Other (See Comments) 06/02/2018  . Lovastatin Other (See Comments) 06/02/2018  . Tetracyclines & related Rash 06/02/2018    History reviewed. No pertinent family history.  Social History   Socioeconomic History  . Marital status: Married    Spouse name: Not on file  . Number of children: Not on file  . Years of education: Not on file  . Highest education level: Not on file  Occupational History  . Not on file  Tobacco Use  . Smoking status: Never Smoker  . Smokeless tobacco: Never Used  Substance and Sexual Activity  . Alcohol use: Never  . Drug use: Never  . Sexual activity: Not on file  Other Topics  Concern  . Not on file  Social History Narrative  . Not on file   Social Determinants of Health   Financial Resource Strain:   . Difficulty of Paying Living Expenses: Not on file  Food Insecurity:   . Worried About Programme researcher, broadcasting/film/video in the Last Year: Not on file  . Ran Out of Food in the Last Year: Not on file  Transportation Needs:   . Lack of Transportation (Medical): Not on file  . Lack of Transportation (Non-Medical): Not on file  Physical Activity:   . Days of Exercise per Week:  Not on file  . Minutes of Exercise per Session: Not on file  Stress:   . Feeling of Stress : Not on file  Social Connections:   . Frequency of Communication with Friends and Family: Not on file  . Frequency of Social Gatherings with Friends and Family: Not on file  . Attends Religious Services: Not on file  . Active Member of Clubs or Organizations: Not on file  . Attends Banker Meetings: Not on file  . Marital Status: Not on file  Intimate Partner Violence:   . Fear of Current or Ex-Partner: Not on file  . Emotionally Abused: Not on file  . Physically Abused: Not on file  . Sexually Abused: Not on file    Review of Systems: See HPI, otherwise negative ROS  Physical Exam: BP (!) 173/61   Pulse 62   Temp (!) 97.3 F (36.3 C) (Temporal)   Resp 16   Ht 5\' 3"  (1.6 m)   Wt 48.5 kg   SpO2 100%   BMI 18.95 kg/m  General:   Alert,  pleasant and cooperative in NAD Head:  Normocephalic and atraumatic. Neck:  Supple; no masses or thyromegaly. Lungs:  Clear throughout to auscultation.    Heart:  Regular rate and rhythm. Abdomen:  Soft, nontender and nondistended. Normal bowel sounds, without guarding, and without rebound.   Neurologic:  Alert and  oriented x4;  grossly normal neurologically.  Impression/Plan: [name] is here for an endoscopy to be performed for gastrojejunal anastomotic stricture  Risks, benefits, limitations, and alternatives regarding  endoscopy have been reviewed with the patient.  Questions have been answered.  All parties agreeable.   Darcus Pester, MD  04/08/2019, 12:44 PM

## 2019-04-08 NOTE — Transfer of Care (Signed)
Immediate Anesthesia Transfer of Care Note  Patient: [name]  Procedure(s) Performed: ESOPHAGOGASTRODUODENOSCOPY (EGD) WITH PROPOFOL (N/A Throat) BIOPSY (N/A Throat)  Patient Location: PACU  Anesthesia Type: General  Level of Consciousness: awake, alert  and patient cooperative  Airway and Oxygen Therapy: Patient Spontanous Breathing and Patient connected to supplemental oxygen  Post-op Assessment: Post-op Vital signs reviewed, Patient's Cardiovascular Status Stable, Respiratory Function Stable, Patent Airway and No signs of Nausea or vomiting  Post-op Vital Signs: Reviewed and stable  Complications: No apparent anesthesia complications

## 2019-04-08 NOTE — Anesthesia Preprocedure Evaluation (Signed)
Anesthesia Evaluation  Patient identified by MRN, date of birth, ID band Patient awake    Reviewed: Allergy & Precautions, H&P , NPO status , Patient's Chart, lab work & pertinent test results, reviewed documented beta blocker date and time   Airway Mallampati: II  TM Distance: >3 FB Neck ROM: full    Dental no notable dental hx.    Pulmonary neg pulmonary ROS,    Pulmonary exam normal breath sounds clear to auscultation       Cardiovascular Exercise Tolerance: Good hypertension,  Rhythm:regular Rate:Normal     Neuro/Psych negative neurological ROS  negative psych ROS   GI/Hepatic Neg liver ROS, GERD  ,Partial gastric outlet obstruction   Endo/Other  diabetes, Type 2  Renal/GU negative Renal ROS  negative genitourinary   Musculoskeletal   Abdominal   Peds  Hematology negative hematology ROS (+)   Anesthesia Other Findings   Reproductive/Obstetrics negative OB ROS                             Anesthesia Physical Anesthesia Plan  ASA: III  Anesthesia Plan: General   Post-op Pain Management:    Induction:   PONV Risk Score and Plan: 3 and Propofol infusion, TIVA and Treatment may vary due to age or medical condition  Airway Management Planned:   Additional Equipment:   Intra-op Plan:   Post-operative Plan:   Informed Consent: I have reviewed the patients History and Physical, chart, labs and discussed the procedure including the risks, benefits and alternatives for the proposed anesthesia with the patient or authorized representative who has indicated his/her understanding and acceptance.     Dental Advisory Given  Plan Discussed with: CRNA  Anesthesia Plan Comments:         Anesthesia Quick Evaluation

## 2019-04-08 NOTE — Anesthesia Procedure Notes (Signed)
Performed by: Vishnu Moeller, CRNA Pre-anesthesia Checklist: Patient identified, Emergency Drugs available, Suction available, Timeout performed and Patient being monitored Patient Re-evaluated:Patient Re-evaluated prior to induction Oxygen Delivery Method: Nasal cannula Placement Confirmation: positive ETCO2       

## 2019-04-08 NOTE — Discharge Instructions (Signed)
General Anesthesia, Adult, Care After This sheet gives you information about how to care for yourself after your procedure. Your health care provider may also give you more specific instructions. If you have problems or questions, contact your health care provider. What can I expect after the procedure? After the procedure, the following side effects are common:  Pain or discomfort at the IV site.  Nausea.  Vomiting.  Sore throat.  Trouble concentrating.  Feeling cold or chills.  Weak or tired.  Sleepiness and fatigue.  Soreness and body aches. These side effects can affect parts of the body that were not involved in surgery. Follow these instructions at home:  For at least 24 hours after the procedure:  Have a responsible adult stay with you. It is important to have someone help care for you until you are awake and alert.  Rest as needed.  Do not: ? Participate in activities in which you could fall or become injured. ? Drive. ? Use heavy machinery. ? Drink alcohol. ? Take sleeping pills or medicines that cause drowsiness. ? Make important decisions or sign legal documents. ? Take care of children on your own. Eating and drinking  Follow any instructions from your health care provider about eating or drinking restrictions.  When you feel hungry, start by eating small amounts of foods that are soft and easy to digest (bland), such as toast. Gradually return to your regular diet.  Drink enough fluid to keep your urine pale yellow.  If you vomit, rehydrate by drinking water, juice, or clear broth. General instructions  If you have sleep apnea, surgery and certain medicines can increase your risk for breathing problems. Follow instructions from your health care provider about wearing your sleep device: ? Anytime you are sleeping, including during daytime naps. ? While taking prescription pain medicines, sleeping medicines, or medicines that make you drowsy.  Return to  your normal activities as told by your health care provider. Ask your health care provider what activities are safe for you.  Take over-the-counter and prescription medicines only as told by your health care provider.  If you smoke, do not smoke without supervision.  Keep all follow-up visits as told by your health care provider. This is important. Contact a health care provider if:  You have nausea or vomiting that does not get better with medicine.  You cannot eat or drink without vomiting.  You have pain that does not get better with medicine.  You are unable to pass urine.  You develop a skin rash.  You have a fever.  You have redness around your IV site that gets worse. Get help right away if:  You have difficulty breathing.  You have chest pain.  You have blood in your urine or stool, or you vomit blood. Summary  After the procedure, it is common to have a sore throat or nausea. It is also common to feel tired.  Have a responsible adult stay with you for the first 24 hours after general anesthesia. It is important to have someone help care for you until you are awake and alert.  When you feel hungry, start by eating small amounts of foods that are soft and easy to digest (bland), such as toast. Gradually return to your regular diet.  Drink enough fluid to keep your urine pale yellow.  Return to your normal activities as told by your health care provider. Ask your health care provider what activities are safe for you. This information is not   intended to replace advice given to you by your health care provider. Make sure you discuss any questions you have with your health care provider. Document Revised: 02/09/2017 Document Reviewed: 09/22/2016 Elsevier Patient Education  2020 Elsevier Inc.  

## 2019-04-09 ENCOUNTER — Encounter: Payer: Self-pay | Admitting: *Deleted

## 2019-04-10 LAB — SURGICAL PATHOLOGY

## 2019-06-12 ENCOUNTER — Ambulatory Visit (INDEPENDENT_AMBULATORY_CARE_PROVIDER_SITE_OTHER): Payer: Medicare PPO | Admitting: Gastroenterology

## 2019-06-12 ENCOUNTER — Other Ambulatory Visit: Payer: Self-pay

## 2019-06-12 ENCOUNTER — Encounter: Payer: Self-pay | Admitting: Gastroenterology

## 2019-06-12 VITALS — BP 165/84 | HR 58 | Temp 97.5°F | Ht 63.0 in | Wt 115.1 lb

## 2019-06-12 DIAGNOSIS — K9189 Other postprocedural complications and disorders of digestive system: Secondary | ICD-10-CM | POA: Diagnosis not present

## 2019-06-12 DIAGNOSIS — Z980 Intestinal bypass and anastomosis status: Secondary | ICD-10-CM

## 2019-06-12 DIAGNOSIS — K311 Adult hypertrophic pyloric stenosis: Secondary | ICD-10-CM | POA: Diagnosis not present

## 2019-06-12 NOTE — Progress Notes (Signed)
[name], MD [address]  [address]  [place], [place] [address]  Main: [phone]  Fax: [phone]    Gastroenterology Consultation  Referring Provider:     Marguarite Arbour, MD Primary Care Physician:  Marguarite Arbour, MD Primary Gastroenterologist:  Dr. Maximino Greenland Reason for Consultation:   Gastrojejunal anastomotic stricture        HPI:   [name] is a 86 y.o. female referred by Dr. 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 Patient has history of gastrojejunostomy by Dr. Lemar Livings in 2011.  She subsequently developed anastomotic stricture that resulted in partial gastric outlet obstruction that required dilation in 06/05/2018 and most recently again when she was admitted to [facility] in 02/2019 to 63mm.  Patient recovered well and transition to soft food, tolerated well.  She also has chronic constipation Patient has been evaluated by Dr. Everlene Farrier and previously by Dr. Aleen Campi and patient and her daughter are reluctant to undergo surgical resection given her age.  Interval summary Patient's weight is 112 pounds which has been stable and slightly improved.  Patient is tolerating soft diet, well managed by her daughter.  She does have irregular bowel habits for which she takes fiber supplements.  Patient denies abdominal pain, nausea, vomiting, bloating.  She is working with physical therapy and able to ambulate to a limited extent Patient is currently taking Protonix 40 mg 2 times a day  Follow-up visit 06/12/2019 [name] is accompanied by her daughter today.  She underwent upper endoscopy in 03/2019 which revealed Billroth II gastrojejunostomy with no evidence of ulceration.  She did have a kink/tight turn at the anastomosis causing symptoms of gastric outlet obstruction.  Patient has been on acid suppressive therapy, strictly following mechanical soft/ pured diet and has been  tolerating it very well.  She reports also having regular bowel movements.  She gained 3 pounds since last visit, current weight is 115 LBS. Most recent labs including CBC and CMP are unremarkable, hemoglobin A1c is mildly elevated, TSH normal  NSAIDs: None  Antiplts/Anticoagulants/Anti thrombotics: None  GI Procedures:  EGD 02/28/2019 EGD 06/05/2018 EGD 06/04/2018 Upper endoscopy 04/08/2019 - Stenosed Billroth II gastrojejunostomy was found, characterized by friable mucosa. - A medium amount of food (residue) in the stomach. - Normal stomach. Biopsied. - LA Grade A reflux esophagitis with no bleeding. - Esophagogastric landmarks identified.   Past Medical History:  Diagnosis Date  . Acute esophagitis   . GERD (gastroesophageal reflux disease)   . High cholesterol   . Hypertension   . Ileus ([facility]) 06/02/2018  . Long term prescription opiate use 08/22/2017  . Uses walker   . Vertigo    long ago.  no current issues  . Weakness of left foot   . Wears hearing aid in both ears     Past Surgical History:  Procedure Laterality Date  . ABDOMINAL SURGERY    . BACK SURGERY    . BIOPSY N/A 04/08/2019   Procedure: BIOPSY;  Surgeon: Toney Reil, MD;  Location: [facility];  Service: Endoscopy;  Laterality: N/A;  . CHOLECYSTECTOMY    . ESOPHAGOGASTRODUODENOSCOPY N/A 06/04/2018   Procedure: ESOPHAGOGASTRODUODENOSCOPY (EGD);  Surgeon: Pasty Spillers, MD;  Location: [facility] ENDOSCOPY;  Service: Endoscopy;  Laterality: N/A;  . ESOPHAGOGASTRODUODENOSCOPY N/A 06/05/2018   Procedure: ESOPHAGOGASTRODUODENOSCOPY (EGD);  Surgeon: Midge Minium, MD;  Location: [facility]COPY;  Service: Endoscopy;  Laterality: N/A;  use Fluoro  bed, X-ray is aware  . ESOPHAGOGASTRODUODENOSCOPY N/A 02/28/2019   Procedure: ESOPHAGOGASTRODUODENOSCOPY (EGD);  Surgeon: Pasty Spillers, MD;  Location[facility] END[facility]COPY;  Service: Endoscopy;  Laterality: N/A;  . ESOPHAGOGASTRODUODENOSCOPY (EGD) WITH PROPOFOL N/A  04/08/2019   Procedure: ESOPHAGOGASTRODUODENOSCOPY (EGD) WITH PROPOFOL;  Surgeon: Toney Reil, MD;  Location: [facility];  Service: Endoscopy;  Laterality: N/A;  priority 4  . JOINT REPLACEMENT    . REPLACEMENT TOTAL KNEE Right   . TOE AMPUTATION Left    great toe  . TONSILLECTOMY      Current Outpatient Medications:  .  atorvastatin (LIPITOR) 20 MG tablet, Take 20 mg by mouth at bedtime. , Disp: , Rfl:  .  Cyanocobalamin (VITAMIN B-12 PO), Take by mouth daily., Disp: , Rfl:  .  fentaNYL (DURAGESIC) 100 MCG/HR, Place 1 patch onto the skin every 3 (three) days., Disp: , Rfl:  .  folic acid (FOLVITE) 1 MG tablet, Take 1 mg by mouth daily. , Disp: , Rfl:  .  geriatric multivitamins-minerals (ELDERTONIC/GEVRABON) LIQD, Take by mouth daily., Disp: , Rfl:  .  pantoprazole (PROTONIX) 40 MG tablet, Take 1 tablet (40 mg total) by mouth 2 (two) times daily before a meal., Disp: 180 tablet, Rfl: 3 .  polyethylene glycol powder (MIRALAX) 17 GM/SCOOP powder, Take 1 Container by mouth once., Disp: , Rfl:  .  Calcium Polycarbophil (FIBER) 625 MG TABS, Take by mouth., Disp: , Rfl:    History reviewed. No pertinent family history.   Social History   Tobacco Use  . Smoking status: Never Smoker  . Smokeless tobacco: Never Used  Substance Use Topics  . Alcohol use: Never  . Drug use: Never    Allergies as of 06/12/2019 - Review Complete 06/12/2019  Allergen Reaction Noted  . Colesevelam Other (See Comments) 06/02/2018  . Lovastatin Other (See Comments) 06/02/2018  . Tetracyclines & related Rash 06/02/2018    Review of Systems:    All systems reviewed and negative except where noted in HPI.   Physical Exam:  BP (!) 165/84 (BP Location: Left Arm, Patient Position: Sitting, Cuff Size: Normal)   Pulse (!) 58   Temp (!) 97.5 F (36.4 C) (Oral)   Ht 5\' 3"  (1.6 m)   Wt 115 lb 2 oz (52.2 kg)   BMI 20.39 kg/m  No LMP recorded. Patient is postmenopausal.  General:   Alert,  moderately built, moderately nourished, pleasant and cooperative in NAD Head:  Normocephalic and atraumatic. Eyes:  Sclera clear, no icterus.   Conjunctiva pink. Ears:  Normal auditory acuity. Lungs:  Respirations even and unlabored.  Clear throughout to auscultation.   No wheezes, crackles, or rhonchi. No acute distress. Heart:  Regular rate and rhythm; no murmurs, clicks, rubs, or gallops. Abdomen:  Normal bowel sounds. Soft, scaphoid, non-tender and non distended, tympanic without masses, hepatosplenomegaly or hernias noted.  No guarding or rebound tenderness.   Rectal: Not performed Msk:  Symmetrical without gross deformities. Good, equal movement & strength bilaterally. Pulses:  Normal pulses noted. Extremities:  No clubbing or edema.  No cyanosis. Neurologic:  Alert and oriented x3;  grossly normal neurologically. Skin:  Intact without significant lesions or rashes. No jaundice. Psych:  Alert and cooperative. Normal mood and affect.  Imaging Studies: Reviewed  Assessment and Plan:   [name] is a 86 y.o. 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  Patient is also evaluated  by general surgery for resection/revision of the gastrojejunal anastomotic stricture and she is reluctant to undergo surgery at this time.  Repeat upper endoscopy revealed tight turn at the gastrojejunal anastomosis rather than a stricture probably contributing to symptoms of partial gastric outlet obstruction No further intervention at this time other than dietary modification  Continue Protonix 40 mg 2 times a day before meals, long-term Continue soft foods, discussed few options Continue stool softener as needed to have regular bowel movements   Follow up in 4 months   Cephas Darby, MD

## 2019-08-17 ENCOUNTER — Observation Stay: Payer: Medicare PPO

## 2019-08-17 ENCOUNTER — Emergency Department: Payer: Medicare PPO

## 2019-08-17 ENCOUNTER — Inpatient Hospital Stay
Admission: EM | Admit: 2019-08-17 | Discharge: 2019-08-20 | DRG: 871 | Disposition: A | Discharge status: Home / Self Care | Payer: Medicare PPO | Attending: Internal Medicine | Admitting: Internal Medicine

## 2019-08-17 ENCOUNTER — Other Ambulatory Visit: Payer: Self-pay

## 2019-08-17 DIAGNOSIS — E785 Hyperlipidemia, unspecified: Secondary | ICD-10-CM | POA: Diagnosis present

## 2019-08-17 DIAGNOSIS — Z888 Allergy status to other drugs, medicaments and biological substances status: Secondary | ICD-10-CM

## 2019-08-17 DIAGNOSIS — K5900 Constipation, unspecified: Secondary | ICD-10-CM

## 2019-08-17 DIAGNOSIS — R509 Fever, unspecified: Secondary | ICD-10-CM | POA: Diagnosis not present

## 2019-08-17 DIAGNOSIS — K219 Gastro-esophageal reflux disease without esophagitis: Secondary | ICD-10-CM | POA: Diagnosis present

## 2019-08-17 DIAGNOSIS — G8929 Other chronic pain: Secondary | ICD-10-CM | POA: Diagnosis present

## 2019-08-17 DIAGNOSIS — E7800 Pure hypercholesterolemia, unspecified: Secondary | ICD-10-CM | POA: Diagnosis present

## 2019-08-17 DIAGNOSIS — Z89412 Acquired absence of left great toe: Secondary | ICD-10-CM

## 2019-08-17 DIAGNOSIS — A4151 Sepsis due to Escherichia coli [E. coli]: Secondary | ICD-10-CM | POA: Diagnosis not present

## 2019-08-17 DIAGNOSIS — Z20822 Contact with and (suspected) exposure to covid-19: Secondary | ICD-10-CM | POA: Diagnosis present

## 2019-08-17 DIAGNOSIS — K311 Adult hypertrophic pyloric stenosis: Secondary | ICD-10-CM | POA: Diagnosis present

## 2019-08-17 DIAGNOSIS — N3000 Acute cystitis without hematuria: Secondary | ICD-10-CM | POA: Diagnosis not present

## 2019-08-17 DIAGNOSIS — Z66 Do not resuscitate: Secondary | ICD-10-CM | POA: Diagnosis present

## 2019-08-17 DIAGNOSIS — Z79899 Other long term (current) drug therapy: Secondary | ICD-10-CM

## 2019-08-17 DIAGNOSIS — Z9049 Acquired absence of other specified parts of digestive tract: Secondary | ICD-10-CM

## 2019-08-17 DIAGNOSIS — Z96651 Presence of right artificial knee joint: Secondary | ICD-10-CM | POA: Diagnosis present

## 2019-08-17 DIAGNOSIS — M549 Dorsalgia, unspecified: Secondary | ICD-10-CM | POA: Diagnosis present

## 2019-08-17 DIAGNOSIS — G9341 Metabolic encephalopathy: Secondary | ICD-10-CM | POA: Diagnosis present

## 2019-08-17 DIAGNOSIS — Z79891 Long term (current) use of opiate analgesic: Secondary | ICD-10-CM

## 2019-08-17 DIAGNOSIS — Z980 Intestinal bypass and anastomosis status: Secondary | ICD-10-CM

## 2019-08-17 DIAGNOSIS — E876 Hypokalemia: Secondary | ICD-10-CM

## 2019-08-17 DIAGNOSIS — R4182 Altered mental status, unspecified: Secondary | ICD-10-CM

## 2019-08-17 DIAGNOSIS — I10 Essential (primary) hypertension: Secondary | ICD-10-CM | POA: Diagnosis present

## 2019-08-17 DIAGNOSIS — Z881 Allergy status to other antibiotic agents status: Secondary | ICD-10-CM

## 2019-08-17 DIAGNOSIS — Z974 Presence of external hearing-aid: Secondary | ICD-10-CM

## 2019-08-17 DIAGNOSIS — Z8249 Family history of ischemic heart disease and other diseases of the circulatory system: Secondary | ICD-10-CM

## 2019-08-17 DIAGNOSIS — E119 Type 2 diabetes mellitus without complications: Secondary | ICD-10-CM

## 2019-08-17 DIAGNOSIS — D696 Thrombocytopenia, unspecified: Secondary | ICD-10-CM

## 2019-08-17 DIAGNOSIS — K9189 Other postprocedural complications and disorders of digestive system: Secondary | ICD-10-CM

## 2019-08-17 LAB — URINALYSIS, COMPLETE (UACMP) WITH MICROSCOPIC
Bilirubin Urine: NEGATIVE
Glucose, UA: NEGATIVE mg/dL
Ketones, ur: NEGATIVE mg/dL
Nitrite: NEGATIVE
Protein, ur: 100 mg/dL — AB
RBC / HPF: >50 RBC/hpf — ABNORMAL HIGH (ref 0–5)
Specific Gravity, Urine: 1.015 (ref 1.005–1.030)
Squamous Epithelial / HPF: NONE SEEN (ref 0–5)
WBC, UA: >50 WBC/hpf — ABNORMAL HIGH (ref 0–5)
pH: 5 (ref 5.0–8.0)

## 2019-08-17 LAB — CBC WITH DIFFERENTIAL/PLATELET
Abs Immature Granulocytes: 0.03 10*3/uL (ref 0.00–0.07)
Basophils Absolute: 0 10*3/uL (ref 0.0–0.1)
Basophils Relative: 0 %
Eosinophils Absolute: 0 10*3/uL (ref 0.0–0.5)
Eosinophils Relative: 0 %
HCT: 37.5 % (ref 36.0–46.0)
Hemoglobin: 12.6 g/dL (ref 12.0–15.0)
Immature Granulocytes: 0 %
Lymphocytes Relative: 5 %
Lymphs Abs: 0.4 10*3/uL — ABNORMAL LOW (ref 0.7–4.0)
MCH: 30.4 pg (ref 26.0–34.0)
MCHC: 33.6 g/dL (ref 30.0–36.0)
MCV: 90.4 fL (ref 80.0–100.0)
Monocytes Absolute: 0.3 10*3/uL (ref 0.1–1.0)
Monocytes Relative: 5 %
Neutro Abs: 6.1 10*3/uL (ref 1.7–7.7)
Neutrophils Relative %: 90 %
Platelets: 89 10*3/uL — ABNORMAL LOW (ref 150–400)
RBC: 4.15 MIL/uL (ref 3.87–5.11)
RDW: 12.4 % (ref 11.5–15.5)
WBC: 6.8 10*3/uL (ref 4.0–10.5)
nRBC: 0 % (ref 0.0–0.2)

## 2019-08-17 LAB — COMPREHENSIVE METABOLIC PANEL
ALT: 11 U/L (ref 0–44)
AST: 11 U/L — ABNORMAL LOW (ref 15–41)
Albumin: 3.3 g/dL — ABNORMAL LOW (ref 3.5–5.0)
Alkaline Phosphatase: 59 U/L (ref 38–126)
Anion gap: 11 (ref 5–15)
BUN: 11 mg/dL (ref 8–23)
CO2: 23 mmol/L (ref 22–32)
Calcium: 8.2 mg/dL — ABNORMAL LOW (ref 8.9–10.3)
Chloride: 103 mmol/L (ref 98–111)
Creatinine, Ser: 0.72 mg/dL (ref 0.44–1.00)
GFR calc Af Amer: >60 mL/min (ref >60)
GFR calc non Af Amer: >60 mL/min (ref >60)
Glucose, Bld: 167 mg/dL — ABNORMAL HIGH (ref 70–99)
Potassium: 3.3 mmol/L — ABNORMAL LOW (ref 3.5–5.1)
Sodium: 137 mmol/L (ref 135–145)
Total Bilirubin: 1.4 mg/dL — ABNORMAL HIGH (ref 0.3–1.2)
Total Protein: 6 g/dL — ABNORMAL LOW (ref 6.5–8.1)

## 2019-08-17 LAB — SARS CORONAVIRUS 2 BY RT PCR (HOSPITAL ORDER, PERFORMED IN ~~LOC~~ HOSPITAL LAB): SARS Coronavirus 2: NEGATIVE

## 2019-08-17 LAB — LIPASE, BLOOD: Lipase: 21 U/L (ref 11–51)

## 2019-08-17 LAB — MAGNESIUM: Magnesium: 1.4 mg/dL — ABNORMAL LOW (ref 1.7–2.4)

## 2019-08-17 LAB — GLUCOSE, CAPILLARY: Glucose-Capillary: 181 mg/dL — ABNORMAL HIGH (ref 70–99)

## 2019-08-17 LAB — PROCALCITONIN: Procalcitonin: 0.14 ng/mL

## 2019-08-17 LAB — LACTIC ACID, PLASMA: Lactic Acid, Venous: 1.1 mmol/L (ref 0.5–1.9)

## 2019-08-17 LAB — TSH: TSH: 0.688 u[IU]/mL (ref 0.350–4.500)

## 2019-08-17 MED ORDER — ATORVASTATIN CALCIUM 20 MG PO TABS
20.0000 mg | ORAL_TABLET | Freq: Every day | ORAL | Status: DC
  Administered 2019-08-17 – 2019-08-19 (×3): 20 mg via ORAL
  Filled 2019-08-17 (×3): qty 1

## 2019-08-17 MED ORDER — ACETAMINOPHEN 650 MG RE SUPP: 650.0000 mg | Freq: Four times a day (QID) | RECTAL | Status: DC | PRN

## 2019-08-17 MED ORDER — FOLIC ACID 1 MG PO TABS
1.0000 mg | ORAL_TABLET | Freq: Every day | ORAL | Status: DC
  Administered 2019-08-17 – 2019-08-20 (×4): 1 mg via ORAL
  Filled 2019-08-17 (×4): qty 1

## 2019-08-17 MED ORDER — ONDANSETRON HCL 4 MG PO TABS: 4.0000 mg | ORAL_TABLET | Freq: Four times a day (QID) | ORAL | Status: DC | PRN

## 2019-08-17 MED ORDER — FENTANYL 100 MCG/HR TD PT72
1.0000 | MEDICATED_PATCH | TRANSDERMAL | Status: DC
  Administered 2019-08-19: 1 via TRANSDERMAL
  Filled 2019-08-17: qty 1

## 2019-08-17 MED ORDER — LACTATED RINGERS IV BOLUS
1000.0000 mL | Freq: Once | INTRAVENOUS | Status: AC
  Administered 2019-08-17: 1000 mL via INTRAVENOUS

## 2019-08-17 MED ORDER — SENNOSIDES-DOCUSATE SODIUM 8.6-50 MG PO TABS
1.0000 | ORAL_TABLET | Freq: Every evening | ORAL | Status: DC | PRN
  Filled 2019-08-17 (×2): qty 1

## 2019-08-17 MED ORDER — PANTOPRAZOLE SODIUM 40 MG PO TBEC
40.0000 mg | DELAYED_RELEASE_TABLET | Freq: Two times a day (BID) | ORAL | Status: DC
  Administered 2019-08-18 – 2019-08-20 (×5): 40 mg via ORAL
  Filled 2019-08-17 (×5): qty 1

## 2019-08-17 MED ORDER — ONDANSETRON HCL 4 MG/2ML IJ SOLN: 4.0000 mg | Freq: Four times a day (QID) | INTRAMUSCULAR | Status: DC | PRN

## 2019-08-17 MED ORDER — SODIUM CHLORIDE 0.9 % IV SOLN: 1.0000 g | INTRAVENOUS | Status: DC

## 2019-08-17 MED ORDER — ACETAMINOPHEN 500 MG PO TABS
1000.0000 mg | ORAL_TABLET | Freq: Once | ORAL | Status: AC
  Administered 2019-08-17: 1000 mg via ORAL
  Filled 2019-08-17: qty 2

## 2019-08-17 MED ORDER — ACETAMINOPHEN 325 MG PO TABS
650.0000 mg | ORAL_TABLET | Freq: Four times a day (QID) | ORAL | Status: DC | PRN
  Administered 2019-08-18 – 2019-08-19 (×5): 650 mg via ORAL
  Filled 2019-08-17 (×5): qty 2

## 2019-08-17 MED ORDER — SODIUM CHLORIDE 0.9 % IV SOLN
1.0000 g | Freq: Once | INTRAVENOUS | Status: AC
  Administered 2019-08-17: 1 g via INTRAVENOUS
  Filled 2019-08-17: qty 10

## 2019-08-17 MED ORDER — MAGNESIUM SULFATE 2 GM/50ML IV SOLN
2.0000 g | Freq: Once | INTRAVENOUS | Status: AC
  Administered 2019-08-17: 2 g via INTRAVENOUS
  Filled 2019-08-17: qty 50

## 2019-08-17 MED ORDER — POTASSIUM CHLORIDE 20 MEQ/15ML (10%) PO SOLN
40.0000 meq | Freq: Once | ORAL | Status: DC
  Filled 2019-08-17: qty 30

## 2019-08-17 MED ORDER — POTASSIUM CHLORIDE IN NACL 40-0.9 MEQ/L-% IV SOLN
INTRAVENOUS | Status: DC
  Filled 2019-08-17 (×2): qty 1000

## 2019-08-17 NOTE — ED Notes (Signed)
Transportation requested  

## 2019-08-17 NOTE — H&P (Signed)
History and Physical    [name] WGN:[id number] DOB: [birth year] DOA: 08/17/2019  PCP: Idelle Crouch, MD  Patient coming from: Home via EMS  I have personally briefly reviewed patient's old medical records in [place]  Chief Complaint: Fever, altered mental status  HPI: [name] is a 86 y.o. female with medical history significant for hypertension, hyperlipidemia, GERD, diet-controlled type 2 diabetes, history of Billroth II gastrojejunostomy with subsequent anastomosis stricture and partial gastric outlet obstruction, and chronic pain who presents to the ED for evaluation of fever and altered mental status.  History is supplemented by patient's daughter at bedside.  Patient was in her usual state of health until earlier today when she was noted to be more lethargic and sleepy than usual.  She appeared to be cold and shivering and daughter attempted to warm her up by turning the heat up and placing a blanket on her.  She appeared to be lethargic and not eating as well as she normally does (usually eats a pured diet due to her gastric outlet obstruction).  She also says she has not had a bowel movement in nearly a week despite recently tried MiraLAX.  EMS were called and she was noted to have a fever of 101 Fahrenheit.  She required assistance to stand up and daughter states that she appeared as she was about to pass out but did not.  She had episodes of nausea with vomiting on EMS arrival and on route to the ED.  Patient otherwise denies any chest pain, palpitations, dyspnea, cough, abdominal pain, diarrhea, dysuria, or obvious bleeding.  She says she feels much improved and near her baseline now.  ED Course:  Initial vitals showed BP 124/51, pulse 86, RR 21, temp 103.1 Fahrenheit, SPO2 90% on room air.  Labs are notable for WBC 6.8, hemoglobin 12.6, platelets 89,000, sodium 137, potassium 3.3, bicarb 23, BUN 11, creatinine 0.72, AST 11, ALT 11, alk phos 59, total  bilirubin 1.4, lactic acid 1.1, lipase 21, procalcitonin 0.14.  Urinalysis shows negative nitrites, large leukocytes, >50 RBC/hpf, >50 WBC/hpf, few bacteria microscopy.  Blood and urine cultures were obtained and pending.  SARS-CoV-2 PCR is negative.  Portable chest x-ray is negative for focal consolidation, edema, or effusion.  Patient was given 1 L LR, Tylenol, and 1 g IV ceftriaxone.  The hospitalist service was consulted to admit for further evaluation and management.  Review of Systems: All systems reviewed and are negative except as documented in history of present illness above.   Past Medical History:  Diagnosis Date  . Acute esophagitis   . GERD (gastroesophageal reflux disease)   . High cholesterol   . Hypertension   . Ileus ([place]) 06/02/2018  . Long term prescription opiate use 08/22/2017  . Uses walker   . Vertigo    long ago.  no current issues  . Weakness of left foot   . Wears hearing aid in both ears     Past Surgical History:  Procedure Laterality Date  . ABDOMINAL SURGERY    . BACK SURGERY    . BIOPSY N/A 04/08/2019   Procedure: BIOPSY;  Surgeon: Lin Landsman, MD;  Location: [place];  Service: Endoscopy;  Laterality: N/A;  . CHOLECYSTECTOMY    . ESOPHAGOGASTRODUODENOSCOPY N/A 06/04/2018   Procedure: ESOPHAGOGASTRODUODENOSCOPY (EGD);  Surgeon: Virgel Manifold, MD;  Location: [facility] ENDOSCOPY;  Service: Endoscopy;  Laterality: N/A;  . ESOPHAGOGASTRODUODENOSCOPY N/A 06/05/2018   Procedure: ESOPHAGOGASTRODUODENOSCOPY (EGD);  Surgeon: Allen Norris,  Darren, MD;  Location: [place] [facility];  Service: Endoscopy;  Laterality: N/A;  use Fluoro bed, X-ray is aware  . ESOPHAGOGASTRODUODENOSCOPY N/A 02/28/2019   Procedure: ESOPHAGOGASTRODUODENOSCOPY (EGD);  Surgeon: Virgel Manifold, MD;  Location: [facility] ENDOSCOPY;  Service: Endoscopy;  Laterality: N/A;  . ESOPHAGOGASTRODUODENOSCOPY (EGD) WITH PROPOFOL N/A 04/08/2019   Procedure: ESOPHAGOGASTRODUODENOSCOPY (EGD) WITH  PROPOFOL;  Surgeon: Lin Landsman, MD;  Location: [place];  Service: Endoscopy;  Laterality: N/A;  priority 4  . JOINT REPLACEMENT    . REPLACEMENT TOTAL KNEE Right   . TOE AMPUTATION Left    great toe  . TONSILLECTOMY      Social History:  reports that she has never smoked. She has never used smokeless tobacco. She reports that she does not drink alcohol and does not use drugs.  Allergies  Allergen Reactions  . Colesevelam Other (See Comments)  . Lovastatin Other (See Comments)  . Tetracyclines & Related Rash    Family History  Problem Relation Age of Onset  . Heart attack Mother   . Heart attack Father      Prior to Admission medications   Medication Sig Start Date End Date Taking? Authorizing Provider  atorvastatin (LIPITOR) 20 MG tablet Take 20 mg by mouth at bedtime.  09/12/16   [provider]  Calcium Polycarbophil (FIBER) 625 MG TABS Take by mouth. 03/11/19 03/10/20  [provider]  Cyanocobalamin (VITAMIN B-12 PO) Take by mouth daily.    [provider]  fentaNYL (DURAGESIC) 100 MCG/HR Place 1 patch onto the skin every 3 (three) days. 03/12/18   [provider]  folic acid (FOLVITE) 1 MG tablet Take 1 mg by mouth daily.  04/10/18   [provider]  geriatric multivitamins-minerals (ELDERTONIC/GEVRABON) LIQD Take by mouth daily.    [provider]  pantoprazole (PROTONIX) 40 MG tablet Take 1 tablet (40 mg total) by mouth 2 (two) times daily before a meal. 03/31/19 06/29/19  Vanga, Tally Due, MD  polyethylene glycol powder (MIRALAX) 17 GM/SCOOP powder Take 1 Container by mouth once.    [provider]    Physical Exam: Vitals:   08/17/19 1902 08/17/19 1907 08/17/19 1911 08/17/19 1930  BP: (!) 127/46   (!) 110/41  Pulse: 74   61  Resp:  18  17  Temp:   99.7 F (37.6 C)   TempSrc:   Oral   SpO2:      Weight:      Height:       Constitutional: Elderly woman resting supine in bed with head  elevated, NAD, calm, comfortable Eyes: PERRL, lids and conjunctivae normal ENMT: Mucous membranes are dry. Posterior pharynx clear of any exudate or lesions. Neck: normal, supple, no masses. Respiratory: clear to auscultation bilaterally, no wheezing, no crackles. Normal respiratory effort. No accessory muscle use.  Cardiovascular: Regular rate and rhythm, systolic murmur present. No extremity edema. 2+ pedal pulses. Abdomen: no tenderness, no masses palpated. No hepatosplenomegaly. Bowel sounds positive.  Musculoskeletal: Protrusions of lower back noted from surgical hardware.  No clubbing / cyanosis. No joint deformity upper and lower extremities. Good ROM, no contractures. Normal muscle tone.  Skin: Senile purpura.  No rashes, lesions, ulcers. No induration Neurologic: CN 2-12 grossly intact. Sensation intact, Strength 5/5 in all 4.  Psychiatric: Normal judgment and insight. Alert and oriented x 3. Normal mood.   Labs on Admission: I have personally reviewed following labs and imaging studies  CBC: Recent Labs  Lab 08/17/19 1756  WBC  6.8  NEUTROABS 6.1  HGB 12.6  HCT 37.5  MCV 90.4  PLT 89*   Basic Metabolic Panel: Recent Labs  Lab 08/17/19 1756  NA 137  K 3.3*  CL 103  CO2 23  GLUCOSE 167*  BUN 11  CREATININE 0.72  CALCIUM 8.2*   GFR: Estimated Creatinine Clearance: 44.2 mL/min (by C-G formula based on SCr of 0.72 mg/dL). Liver Function Tests: Recent Labs  Lab 08/17/19 1756  AST 11*  ALT 11  ALKPHOS 59  BILITOT 1.4*  PROT 6.0*  ALBUMIN 3.3*   Recent Labs  Lab 08/17/19 1756  LIPASE 21   No results for input(s): AMMONIA in the last 168 hours. Coagulation Profile: No results for input(s): INR, PROTIME in the last 168 hours. Cardiac Enzymes: No results for input(s): CKTOTAL, CKMB, CKMBINDEX, TROPONINI in the last 168 hours. BNP (last 3 results) No results for input(s): PROBNP in the last 8760 hours. HbA1C: No results for input(s): HGBA1C in the last 72  hours. CBG: No results for input(s): GLUCAP in the last 168 hours. Lipid Profile: No results for input(s): CHOL, HDL, LDLCALC, TRIG, CHOLHDL, LDLDIRECT in the last 72 hours. Thyroid Function Tests: No results for input(s): TSH, T4TOTAL, FREET4, T3FREE, THYROIDAB in the last 72 hours. Anemia Panel: No results for input(s): VITAMINB12, FOLATE, FERRITIN, TIBC, IRON, RETICCTPCT in the last 72 hours. Urine analysis:    Component Value Date/Time   COLORURINE YELLOW (A) 08/17/2019 1826   APPEARANCEUR TURBID (A) 08/17/2019 1826   LABSPEC 1.015 08/17/2019 1826   PHURINE 5.0 08/17/2019 1826   GLUCOSEU NEGATIVE 08/17/2019 1826   HGBUR LARGE (A) 08/17/2019 1826   BILIRUBINUR NEGATIVE 08/17/2019 1826   KETONESUR NEGATIVE 08/17/2019 1826   PROTEINUR 100 (A) 08/17/2019 1826   UROBILINOGEN 0.2 08/03/2006 1158   NITRITE NEGATIVE 08/17/2019 1826   LEUKOCYTESUR LARGE (A) 08/17/2019 1826    Radiological Exams on Admission: DG Chest Portable 1 View  Result Date: 08/17/2019 CLINICAL DATA:  Fever. EXAM: PORTABLE CHEST 1 VIEW COMPARISON:  February 27, 2019 FINDINGS: Heart size is stable. Aortic calcifications are noted. There is no pneumothorax. No large pleural effusion. There is some mild relatively stable pleural thickening along the inferior left chest wall. There is no focal infiltrate. IMPRESSION: No active disease. Electronically Signed   By: Constance Holster M.D.   On: 08/17/2019 19:04    EKG: Independently reviewed. Normal sinus rhythm with wandering leads, low voltage. PAC no longer present when compared to prior EKG.  Assessment/Plan Principal Problem:   Febrile illness Active Problems:   Gastric outlet obstruction   Diabetes mellitus type 2, uncomplicated ([place])   Hyperlipidemia   Hypertension  Rumor H Flannery is a 86 y.o. female with medical history significant for hypertension, hyperlipidemia, GERD, diet-controlled type 2 diabetes, history of Billroth II gastrojejunostomy with  subsequent anastomosis stricture and partial gastric outlet obstruction, and chronic pain who is admitted with febrile illness.  Febrile illness/metabolic encephalopathy/UTI: Suspect secondary to UTI.  CXR without evidence of pneumonia, SARS-CoV-2 PCR is negative, no other obvious infectious etiology at this time.  She also appears to be dehydrated. -Continue empiric IV ceftriaxone -Follow blood and urine cultures -Continue IV fluid resuscitation overnight -Mental status now improved to baseline, AO x4  Hypokalemia/hypomagnesemia: Replete and recheck labs in the morning.  Constipation: Reports no bowel movement for nearly 1 week.  Check KUB.  Gastric outlet obstruction: History of Billroth II gastrojejunostomy with subsequent anastomosis stricture resulting in partial gastric outlet obstruction.  Follows with GI,  Dr. Marius Ditch. -Check KUB as below -Continue IV fluid hydration overnight -Continue pured diet -Continue Protonix  Hypertension: No longer requiring antihypertensives, blood pressure currently soft.  Continue to monitor.  Hyperlipidemia: Continue atorvastatin.  Diet-controlled type 2 diabetes: Last A1c 5.8% on 05/14/2019.  Continue to monitor.  Thrombocytopenia: Platelets 89,000 without obvious bleeding.  Protonix may be contributing.  Continue to monitor.  Chronic back pain: Continue fentanyl patch.  DVT prophylaxis: SCDs Code Status: DNR, confirmed with patient Family Communication: Discussed with patient's daughter at bedside Disposition Plan: From home and likely discharge to home pending adequate hydration, symptomatic improvement, and further cultural data. Consults called: None Admission status:  Status is: Observation  The patient remains OBS appropriate and will d/c before 2 midnights.  Dispo: The patient is from: Home              Anticipated d/c is to: Home              Anticipated d/c date is: 1 day pending adequate hydration, symptomatic improvement,  and further culture data.              Patient currently is not medically stable to d/c.  Zada Finders MD [facility]  If 7PM-7AM, please contact night-coverage [email or web address]  08/17/2019, 8:12 PM

## 2019-08-17 NOTE — ED Provider Notes (Signed)
[facility] Emergency Department Provider Note   ____________________________________________   First MD Initiated Contact with Patient 08/17/19 1745     (approximate)  I have reviewed the triage vital signs and the nursing notes.   HISTORY  Chief Complaint Code Sepsis and Altered Mental Status    HPI [name] is a 86 y.o. female with past medical history of hypertension, hyperlipidemia, diabetes, and gastric outlet obstruction who presents to the ED for altered mental status.  History is limited due to patient's confusion.  Per EMS, family arrived at home to find the patient not acting like her usual self.  She reportedly seemed off when answering questions, is typically ambulatory without difficulty, alert and oriented x4 at her baseline.  EMS noted patient to have a temperature of 101 and she vomited multiple times during transport.  Family reported that she will often have issues with vomiting due to her gastric outlet obstruction, patient denies any complaints including nausea or abdominal pain upon arrival.  She states she is feeling slightly weaker than usual but denies any cough, chest pain, shortness of breath, dysuria, or hematuria.        Past Medical History:  Diagnosis Date  . Acute esophagitis   . GERD (gastroesophageal reflux disease)   . High cholesterol   . Hypertension   . Ileus ([place]) 06/02/2018  . Long term prescription opiate use 08/22/2017  . Uses walker   . Vertigo    long ago.  no current issues  . Weakness of left foot   . Wears hearing aid in both ears     Patient Active Problem List   Diagnosis Date Noted  . Febrile illness 08/17/2019  . Gastrojejunal anastomotic stricture   . History of bypass gastrojejunostomy 02/27/2019  . Arthritis 06/18/2018  . Diabetes mellitus type 2, uncomplicated ([place]) 08/20/1599  . Diabetic nephropathy ([place]) 06/18/2018  . Hemorrhoids 06/18/2018  . Hyperlipidemia 06/18/2018  .  Hypertension 06/18/2018  . Obesity 06/18/2018  . Gastric outlet obstruction   . 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 Lumbar spondylosis with myelopathy 08/22/2017  . Chronic pain 07/21/2013    Past Surgical History:  Procedure Laterality Date  . ABDOMINAL SURGERY    . BACK SURGERY    . BIOPSY N/A 04/08/2019   Procedure: BIOPSY;  Surgeon: Lin Landsman, MD;  Location: [place];  Service: Endoscopy;  Laterality: N/A;  . CHOLECYSTECTOMY    . ESOPHAGOGASTRODUODENOSCOPY N/A 06/04/2018   Procedure: ESOPHAGOGASTRODUODENOSCOPY (EGD);  Surgeon: Virgel Manifold, MD;  Location[facility] ENDOSCOPY;  Service: Endoscopy;  Laterality: N/A;  . ESOPHAGOGASTRODUODENOSCOPY N/A 06/05/2018   Procedure: ESOPHAGOGASTRODUODENOSCOPY (EGD);  Surgeon: Lucilla Lame, MD;  Location: [facility] ENDOSCOPY;  Service: Endoscopy;  Laterality: N/A;  use Fluoro bed, X-ray is aware  . ESOPHAGOGASTRODUODENOSCOPY N/A 02/28/2019   Procedure: ESOPHAGOGASTRODUODENOSCOPY (EGD);  Surgeon: Virgel Manifold, MD;  Location: [facility] ENDOSCOPY;  Service: Endoscopy;  Laterality: N/A;  . ESOPHAGOGASTRODUODENOSCOPY (EGD) WITH PROPOFOL N/A 04/08/2019   Procedure: ESOPHAGOGASTRODUODENOSCOPY (EGD) WITH PROPOFOL;  Surgeon: Lin Landsman, MD;  Location: [place];  Service: Endoscopy;  Laterality: N/A;  priority 4  . JOINT REPLACEMENT    . REPLACEMENT TOTAL KNEE Right   . TOE AMPUTATION Left    great toe  . TONSILLECTOMY      Prior to Admission medications   Medication Sig Start Date End Date Taking? Authorizing Provider  atorvastatin (LIPITOR) 20 MG tablet Take 20 mg by mouth at bedtime.  09/12/16  Yes [provider]  Cyanocobalamin (VITAMIN B-12 PO) Take 1 tablet by mouth daily.    Yes [provider]  folic acid (FOLVITE) 1 MG tablet Take 1 mg by mouth daily.  04/10/18  Yes [provider]  pantoprazole (PROTONIX) 40 MG tablet Take 40 mg by mouth 2 (two) times daily.   Yes [provider]  Calcium Polycarbophil (FIBER) 625 MG TABS Take by mouth. Patient not taking: Reported on 08/17/2019 03/11/19 03/10/20  [provider]  fentaNYL (DURAGESIC) 100 MCG/HR Place 1 patch onto the skin every 3 (three) days. 03/12/18   [provider]    Allergies Colesevelam, Lovastatin, and Tetracyclines & related  Family History  Problem Relation Age of Onset  . Heart attack Mother   . Heart attack Father     Social History Social History   Tobacco Use  . Smoking status: Never Smoker  . Smokeless tobacco: Never Used  Vaping Use  . Vaping Use: Never used  Substance Use Topics  . Alcohol use: Never  . Drug use: Never    Review of Systems  Constitutional: Positive for fever/chills.  Positive for generalized weakness. Eyes: No visual changes. ENT: No sore throat. Cardiovascular: Denies chest pain. Respiratory: Denies shortness of breath. Gastrointestinal: No abdominal pain.  Positive for nausea and vomiting.  No diarrhea.  No constipation. Genitourinary: Negative for dysuria. Musculoskeletal: Negative for back pain. Skin: Negative for rash. Neurological: Negative for headaches, focal weakness or numbness.  ____________________________________________   PHYSICAL EXAM:  VITAL SIGNS: ED Triage Vitals  Enc Vitals Group     BP 08/17/19 1744 (!) 138/59     Pulse Rate 08/17/19 1744 90     Resp 08/17/19 1744 19     Temp 08/17/19 1744 (!) 103.1 F (39.5 C)     Temp Source 08/17/19 1744 Oral     SpO2 08/17/19 1744 90 %     Weight 08/17/19 1745 118 lb (53.5 kg)     Height 08/17/19 1745 5\' 4"  (1.626 m)     Head Circumference --      Peak Flow --      Pain Score 08/17/19 1745 0     Pain Loc --      Pain Edu? --      Excl. in GC? --     Constitutional: Alert and oriented to person, place, and time. Eyes: Conjunctivae are normal. Head: Atraumatic. Nose: No congestion/rhinnorhea. Mouth/Throat: Mucous membranes are moist. Neck: Normal  ROM Cardiovascular: Normal rate, regular rhythm.  Systolic murmur noted. Respiratory: Normal respiratory effort.  No retractions. Lungs CTAB. Gastrointestinal: Soft and nontender. No distention. Genitourinary: deferred Musculoskeletal: No lower extremity tenderness nor edema. Neurologic:  Normal speech and language. No gross focal neurologic deficits are appreciated. Skin:  Skin is warm, dry and intact. No rash noted. Psychiatric: Mood and affect are normal. Speech and behavior are normal.  ____________________________________________   LABS (all labs ordered are listed, but only abnormal results are displayed)  Labs Reviewed  URINALYSIS, COMPLETE (UACMP) WITH MICROSCOPIC - Abnormal; Notable for the following components:      Result Value   Color, Urine YELLOW (*)    APPearance TURBID (*)    Hgb urine dipstick LARGE (*)    Protein, ur 100 (*)    Leukocytes,Ua LARGE (*)    RBC / HPF >50 (*)    WBC, UA >50 (*)    Bacteria, UA FEW (*)    All other components within normal limits  CBC WITH DIFFERENTIAL/PLATELET - Abnormal; Notable for the following components:   Platelets 89 (*)    Lymphs Abs 0.4 (*)    All other components within normal limits  COMPREHENSIVE METABOLIC PANEL - Abnormal; Notable for the following components:   Potassium 3.3 (*)    Glucose, Bld 167 (*)    Calcium 8.2 (*)    Total Protein 6.0 (*)    Albumin 3.3 (*)    AST 11 (*)    Total Bilirubin 1.4 (*)    All other components within normal limits  MAGNESIUM - Abnormal; Notable for the following components:   Magnesium 1.4 (*)    All other components within normal limits  GLUCOSE, CAPILLARY - Abnormal; Notable for the following components:   Glucose-Capillary 181 (*)    All other components within normal limits  SARS CORONAVIRUS 2 BY RT PCR (HOSPITAL ORDER, PERFORMED IN [facility])  CULTURE, BLOOD (ROUTINE X 2)  CULTURE, BLOOD (ROUTINE X 2)  URINE CULTURE  LACTIC ACID, PLASMA  LIPASE, BLOOD   PROCALCITONIN  TSH  PROCALCITONIN  CBC  BASIC METABOLIC PANEL  MAGNESIUM   ____________________________________________  EKG  ED ECG REPORT I, Chesley Noon, the attending physician, personally viewed and interpreted this ECG.   Date: 08/17/2019  EKG Time: 17:48  Rate: 85  Rhythm: normal sinus rhythm  Axis: LAD  Intervals:none  ST&T Change: None   PROCEDURES  Procedure(s) performed (including Critical Care):  Procedures   ____________________________________________   INITIAL IMPRESSION / ASSESSMENT AND PLAN / ED COURSE       86 year old female with past medical history of hypertension, hyperlipidemia, diabetes, and gastric outlet obstruction presents to the ED for altered mental status, was noted by family to be acting unusually with abnormal speech pattern.  She was then found to be febrile with EMS and was vomiting, but received Zofran and now denies any ongoing nausea.  She is awake and alert, oriented x4 with no focal neurologic deficits.  Her episode of confusion is likely due to fever and I would be concerned for sepsis, although she is not tachycardic or tachypneic and there is no obvious infectious source.  We will start sepsis work-up but hold off on antibiotics until source is apparent.  She has no abdominal tenderness and vomiting is likely due to her known gastric outlet obstruction.  UA is consistent with UTI, which is the likely cause of patient's fever.  She now seems to have returned to her baseline mental status and remains hemodynamically stable.  Remainder of labs are unremarkable and she has no further vomiting.  We will treat UTI with Rocephin and send urine for culture.  Given her fever and altered mental status, case was discussed with hospitalist for admission.      ____________________________________________   FINAL CLINICAL IMPRESSION(S) / ED DIAGNOSES  Final diagnoses:  Acute cystitis without hematuria  Altered mental status,  unspecified altered mental status type     ED Discharge Orders    None       Note:  This document was prepared using Dragon voice recognition software and may include unintentional dictation errors.   Chesley Noon, MD 08/17/19 [phone]

## 2019-08-17 NOTE — ED Triage Notes (Signed)
Per EMS, family reported pt was answering questions "off." EMS report BGL 186, temp 101, and N/V x8. EMS gave 8 mg zofran IV and 400 Ml's NS.  Pt is AOX4, NAD noted, denies complaints. Pt denies n/v//d at this time. Hot to touch. Equal movement in all four extremities noted.

## 2019-08-17 NOTE — ED Notes (Signed)
MD Allena Katz notified of BP 91/45 MAP 59, awaiting response. Pt denies CP, dizziness, SOB, or other complaint. Pt is AOX4.

## 2019-08-18 DIAGNOSIS — Z96651 Presence of right artificial knee joint: Secondary | ICD-10-CM | POA: Diagnosis present

## 2019-08-18 DIAGNOSIS — K5900 Constipation, unspecified: Secondary | ICD-10-CM | POA: Diagnosis present

## 2019-08-18 DIAGNOSIS — E876 Hypokalemia: Secondary | ICD-10-CM | POA: Diagnosis present

## 2019-08-18 DIAGNOSIS — E119 Type 2 diabetes mellitus without complications: Secondary | ICD-10-CM | POA: Diagnosis present

## 2019-08-18 DIAGNOSIS — Z20822 Contact with and (suspected) exposure to covid-19: Secondary | ICD-10-CM | POA: Diagnosis present

## 2019-08-18 DIAGNOSIS — G8929 Other chronic pain: Secondary | ICD-10-CM

## 2019-08-18 DIAGNOSIS — D696 Thrombocytopenia, unspecified: Secondary | ICD-10-CM

## 2019-08-18 DIAGNOSIS — A4151 Sepsis due to Escherichia coli [E. coli]: Secondary | ICD-10-CM | POA: Diagnosis present

## 2019-08-18 DIAGNOSIS — I10 Essential (primary) hypertension: Secondary | ICD-10-CM | POA: Diagnosis present

## 2019-08-18 DIAGNOSIS — Z9049 Acquired absence of other specified parts of digestive tract: Secondary | ICD-10-CM | POA: Diagnosis not present

## 2019-08-18 DIAGNOSIS — E785 Hyperlipidemia, unspecified: Secondary | ICD-10-CM | POA: Diagnosis present

## 2019-08-18 DIAGNOSIS — R509 Fever, unspecified: Secondary | ICD-10-CM | POA: Diagnosis not present

## 2019-08-18 DIAGNOSIS — Z89412 Acquired absence of left great toe: Secondary | ICD-10-CM | POA: Diagnosis not present

## 2019-08-18 DIAGNOSIS — Z888 Allergy status to other drugs, medicaments and biological substances status: Secondary | ICD-10-CM | POA: Diagnosis not present

## 2019-08-18 DIAGNOSIS — Z974 Presence of external hearing-aid: Secondary | ICD-10-CM | POA: Diagnosis not present

## 2019-08-18 DIAGNOSIS — Z79899 Other long term (current) drug therapy: Secondary | ICD-10-CM | POA: Diagnosis not present

## 2019-08-18 DIAGNOSIS — Z8249 Family history of ischemic heart disease and other diseases of the circulatory system: Secondary | ICD-10-CM | POA: Diagnosis not present

## 2019-08-18 DIAGNOSIS — K311 Adult hypertrophic pyloric stenosis: Secondary | ICD-10-CM | POA: Diagnosis present

## 2019-08-18 DIAGNOSIS — Z66 Do not resuscitate: Secondary | ICD-10-CM | POA: Diagnosis present

## 2019-08-18 DIAGNOSIS — N3000 Acute cystitis without hematuria: Secondary | ICD-10-CM | POA: Diagnosis present

## 2019-08-18 DIAGNOSIS — G9341 Metabolic encephalopathy: Secondary | ICD-10-CM | POA: Diagnosis present

## 2019-08-18 DIAGNOSIS — E7800 Pure hypercholesterolemia, unspecified: Secondary | ICD-10-CM | POA: Diagnosis present

## 2019-08-18 DIAGNOSIS — Z881 Allergy status to other antibiotic agents status: Secondary | ICD-10-CM | POA: Diagnosis not present

## 2019-08-18 DIAGNOSIS — K219 Gastro-esophageal reflux disease without esophagitis: Secondary | ICD-10-CM | POA: Diagnosis present

## 2019-08-18 HISTORY — DX: Sepsis due to Escherichia coli (e. coli): A41.51

## 2019-08-18 LAB — BLOOD CULTURE ID PANEL (REFLEXED)

## 2019-08-18 LAB — CBC
HCT: 41 % (ref 36.0–46.0)
Hemoglobin: 13.6 g/dL (ref 12.0–15.0)
MCH: 30.6 pg (ref 26.0–34.0)
MCHC: 33.2 g/dL (ref 30.0–36.0)
MCV: 92.3 fL (ref 80.0–100.0)
Platelets: 84 10*3/uL — ABNORMAL LOW (ref 150–400)
RBC: 4.44 MIL/uL (ref 3.87–5.11)
RDW: 12.5 % (ref 11.5–15.5)
WBC: 6.5 10*3/uL (ref 4.0–10.5)
nRBC: 0 % (ref 0.0–0.2)

## 2019-08-18 LAB — BASIC METABOLIC PANEL
Anion gap: 9 (ref 5–15)
BUN: 11 mg/dL (ref 8–23)
CO2: 27 mmol/L (ref 22–32)
Calcium: 8.4 mg/dL — ABNORMAL LOW (ref 8.9–10.3)
Chloride: 104 mmol/L (ref 98–111)
Creatinine, Ser: 0.76 mg/dL (ref 0.44–1.00)
GFR calc Af Amer: >60 mL/min (ref >60)
GFR calc non Af Amer: >60 mL/min (ref >60)
Glucose, Bld: 123 mg/dL — ABNORMAL HIGH (ref 70–99)
Potassium: 3.7 mmol/L (ref 3.5–5.1)
Sodium: 140 mmol/L (ref 135–145)

## 2019-08-18 LAB — MAGNESIUM: Magnesium: 2 mg/dL (ref 1.7–2.4)

## 2019-08-18 LAB — PROCALCITONIN: Procalcitonin: 1.05 ng/mL

## 2019-08-18 MED ORDER — SODIUM CHLORIDE 0.9 % IV SOLN: INTRAVENOUS | Status: DC

## 2019-08-18 MED ORDER — SODIUM CHLORIDE 0.9 % IV SOLN
1.0000 g | Freq: Two times a day (BID) | INTRAVENOUS | Status: DC
  Administered 2019-08-18 – 2019-08-20 (×5): 1 g via INTRAVENOUS
  Filled 2019-08-18 (×7): qty 1

## 2019-08-18 NOTE — Progress Notes (Signed)
Pharmacy Antibiotic Note  [name] is a 86 y.o. female admitted on 08/17/2019 with bacteremia.  Pharmacy has been consulted for Meropenem dosing.  Plan: Meropenem 1gm IV q12hrs  Height: 5\' 4"  (162.6 cm) Weight: 53.2 kg (117 lb 3.2 oz) IBW/kg (Calculated) : 54.7  Temp (24hrs), Avg:100.3 F (37.9 C), Min:98.2 F (36.8 C), Max:103.1 F (39.5 C)  Recent Labs  Lab 08/17/19 1749 08/17/19 1756  WBC  --  6.8  CREATININE  --  0.72  LATICACIDVEN 1.1  --     Estimated Creatinine Clearance: 44 mL/min (by C-G formula based on SCr of 0.72 mg/dL).    Allergies  Allergen Reactions  . Colesevelam Other (See Comments)  . Lovastatin Other (See Comments)  . Tetracyclines & Related Rash    Antimicrobials this admission:   >>    >>   Dose adjustments this admission:   Microbiology results:  BCx:   UCx:    Sputum:    MRSA PCR:   Thank you for allowing pharmacy to be a part of this patient's care.  08/19/19 A 08/18/2019 4:52 AM

## 2019-08-18 NOTE — Progress Notes (Signed)
PHARMACY - PHYSICIAN COMMUNICATION CRITICAL VALUE ALERT - BLOOD CULTURE IDENTIFICATION (BCID)  [name] is an 86 y.o. female who presented to [facility] on 08/17/2019 with a chief complaint of fever  Assessment:  Lab reports 4 of 4 bottles + for E Coli  Name of physician (or Provider) Contacted: Reyes Ivan NP  Current antibiotics: Rocephin  Changes to prescribed antibiotics recommended: change to Meropenem Recommendations accepted by provider  Results for orders placed or performed during the hospital encounter of 08/17/19  Blood Culture ID Panel (Reflexed) (Collected: 08/17/2019  5:49 PM)  Result Value Ref Range   Enterococcus species NOT DETECTED NOT DETECTED   Listeria monocytogenes NOT DETECTED NOT DETECTED   Staphylococcus species NOT DETECTED NOT DETECTED   Staphylococcus aureus (BCID) NOT DETECTED NOT DETECTED   Streptococcus species NOT DETECTED NOT DETECTED   Streptococcus agalactiae NOT DETECTED NOT DETECTED   Streptococcus pneumoniae NOT DETECTED NOT DETECTED   Streptococcus pyogenes NOT DETECTED NOT DETECTED   Acinetobacter baumannii NOT DETECTED NOT DETECTED   Enterobacteriaceae species DETECTED (A) NOT DETECTED   Enterobacter cloacae complex NOT DETECTED NOT DETECTED   Escherichia coli DETECTED (A) NOT DETECTED   Klebsiella oxytoca NOT DETECTED NOT DETECTED   Klebsiella pneumoniae NOT DETECTED NOT DETECTED   Proteus species NOT DETECTED NOT DETECTED   Serratia marcescens NOT DETECTED NOT DETECTED   Carbapenem resistance NOT DETECTED NOT DETECTED   Haemophilus influenzae NOT DETECTED NOT DETECTED   Neisseria meningitidis NOT DETECTED NOT DETECTED   Pseudomonas aeruginosa NOT DETECTED NOT DETECTED   Candida albicans NOT DETECTED NOT DETECTED   Candida glabrata NOT DETECTED NOT DETECTED   Candida krusei NOT DETECTED NOT DETECTED   Candida parapsilosis NOT DETECTED NOT DETECTED   Candida tropicalis NOT DETECTED NOT DETECTED    Valrie Hart A 08/18/2019  4:41  AM

## 2019-08-18 NOTE — Progress Notes (Signed)
Patient ID: [name], female   DOB: 11/05/1932, 86 y.o.   MRN: [id number] [facility] PROGRESS NOTE  [name] UYQ:[id number] DOB: 21-Apr-1932 DOA: 08/17/2019 PCP: Idelle Crouch, MD  HPI/Subjective: Patient coming in with fever and altered mental status.  Blood cultures positive for E. coli.  Patient feeling better today.  Objective: Vitals:   08/18/19 0814 08/18/19 1249  BP: 106/69 (!) 120/52  Pulse: 81 71  Resp: 17 17  Temp: 99.2 F (37.3 C) 98.2 F (36.8 C)  SpO2: 98% 95%    Intake/Output Summary (Last 24 hours) at 08/18/2019 1339 Last data filed at 08/18/2019 1005 Gross per 24 hour  Intake 1880.91 ml  Output 400 ml  Net 1480.91 ml   Filed Weights   08/17/19 1745 08/18/19 0038  Weight: 53.5 kg 53.2 kg    ROS: Review of Systems  Constitutional: Positive for malaise/fatigue.  Respiratory: Negative for shortness of breath.   Cardiovascular: Negative for chest pain.  Gastrointestinal: Negative for abdominal pain.  Genitourinary: Negative for dysuria.   Exam: Physical Exam HENT:     Nose: No mucosal edema.     Mouth/Throat:     Pharynx: No oropharyngeal exudate.  Eyes:     General: Lids are normal.     Conjunctiva/sclera: Conjunctivae normal.     Pupils: Pupils are equal, round, and reactive to light.  Cardiovascular:     Rate and Rhythm: Normal rate and regular rhythm.     Heart sounds: S1 normal and S2 normal. No murmur heard.   Pulmonary:     Effort: No respiratory distress.     Breath sounds: No wheezing, rhonchi or rales.  Abdominal:     Palpations: Abdomen is soft.     Tenderness: There is no abdominal tenderness.  Musculoskeletal:     Right ankle: No swelling.     Left ankle: No swelling.  Skin:    General: Skin is warm.     Findings: No rash.     Nails: There is no clubbing.  Neurological:     Mental Status: She is alert and oriented to person, place, and time.  Psychiatric:        Attention and Perception: Attention  normal.        Mood and Affect: Mood normal.       Data Reviewed: Basic Metabolic Panel: Recent Labs  Lab 08/17/19 1756 08/18/19 0511  NA 137 140  K 3.3* 3.7  CL 103 104  CO2 23 27  GLUCOSE 167* 123*  BUN 11 11  CREATININE 0.72 0.76  CALCIUM 8.2* 8.4*  MG 1.4* 2.0   Liver Function Tests: Recent Labs  Lab 08/17/19 1756  AST 11*  ALT 11  ALKPHOS 59  BILITOT 1.4*  PROT 6.0*  ALBUMIN 3.3*   Recent Labs  Lab 08/17/19 1756  LIPASE 21   CBC: Recent Labs  Lab 08/17/19 1756 08/18/19 0511  WBC 6.8 6.5  NEUTROABS 6.1  --   HGB 12.6 13.6  HCT 37.5 41.0  MCV 90.4 92.3  PLT 89* 84*    CBG: Recent Labs  Lab 08/17/19 2031  GLUCAP 181*    Recent Results (from the past 240 hour(s))  Culture, blood (routine x 2)     Status: None (Preliminary result)   Collection Time: 08/17/19  5:49 PM   Specimen: BLOOD  Result Value Ref Range Status   Specimen Description BLOOD BLOOD LEFT FOREARM  Final   Special Requests   Final  BOTTLES DRAWN AEROBIC AND ANAEROBIC Blood Culture results may not be optimal due to an excessive volume of blood received in culture bottles   Culture  Setup Time   Final    Organism ID to follow IN BOTH AEROBIC AND ANAEROBIC BOTTLES GRAM NEGATIVE RODS CRITICAL RESULT CALLED TO, READ BACK BY AND VERIFIED WITH: SCOTT HALL ON 08/18/19 AT 0423 [facility] Performed at [facility], [address], [place], [place] [address]    Culture GRAM NEGATIVE RODS  Final   Report Status PENDING  Incomplete  SARS Coronavirus 2 by RT PCR (hospital order, performed in [facility] hospital lab) Nasopharyngeal Nasopharyngeal Swab     Status: None   Collection Time: 08/17/19  5:49 PM   Specimen: Nasopharyngeal Swab  Result Value Ref Range Status   SARS Coronavirus 2 NEGATIVE NEGATIVE Final    Comment: (NOTE) SARS-CoV-2 target nucleic acids are NOT DETECTED.  The SARS-CoV-2 RNA is generally detectable in upper and lower respiratory specimens during the acute  phase of infection. The lowest concentration of SARS-CoV-2 viral copies this assay can detect is 250 copies / mL. A negative result does not preclude SARS-CoV-2 infection and should not be used as the sole basis for treatment or other patient management decisions.  A negative result may occur with improper specimen collection / handling, submission of specimen other than nasopharyngeal swab, presence of viral mutation(s) within the areas targeted by this assay, and inadequate number of viral copies (<250 copies / mL). A negative result must be combined with clinical observations, patient history, and epidemiological information.  Fact Sheet for Patients:   BoilerBrush.com.cy  Fact Sheet for Healthcare Providers: https://pope.com/  This test is not yet approved or  cleared by the [place] FDA and has been authorized for detection and/or diagnosis of SARS-CoV-2 by FDA under an Emergency Use Authorization (EUA).  This EUA will remain in effect (meaning this test can be used) for the duration of the COVID-19 declaration under Section 564(b)(1) of the Act, 21 U.S.C. section 360bbb-3(b)(1), unless the authorization is terminated or revoked sooner.  Performed at [facility], [address] [place], [place] [address]   Blood Culture ID Panel (Reflexed)     Status: Abnormal   Collection Time: 08/17/19  5:49 PM  Result Value Ref Range Status   Enterococcus species NOT DETECTED NOT DETECTED Final   Listeria monocytogenes NOT DETECTED NOT DETECTED Final   Staphylococcus species NOT DETECTED NOT DETECTED Final   Staphylococcus aureus (BCID) NOT DETECTED NOT DETECTED Final   Streptococcus species NOT DETECTED NOT DETECTED Final   Streptococcus agalactiae NOT DETECTED NOT DETECTED Final   Streptococcus pneumoniae NOT DETECTED NOT DETECTED Final   Streptococcus pyogenes NOT DETECTED NOT DETECTED Final   Acinetobacter baumannii NOT  DETECTED NOT DETECTED Final   Enterobacteriaceae species DETECTED (A) NOT DETECTED Final    Comment: Enterobacteriaceae represent a large family of gram-negative bacteria, not a single organism. CRITICAL RESULT CALLED TO, READ BACK BY AND VERIFIED WITH: SCOTT HALL ON 08/18/19 AT 0423 [facility]    Enterobacter cloacae complex NOT DETECTED NOT DETECTED Final   Escherichia coli DETECTED (A) NOT DETECTED Final    Comment: CRITICAL RESULT CALLED TO, READ BACK BY AND VERIFIED WITH: SCOTT HALL ON 08/18/19 AT 0423 [facility]    Klebsiella oxytoca NOT DETECTED NOT DETECTED Final   Klebsiella pneumoniae NOT DETECTED NOT DETECTED Final   Proteus species NOT DETECTED NOT DETECTED Final   Serratia marcescens NOT DETECTED NOT DETECTED Final  Carbapenem resistance NOT DETECTED NOT DETECTED Final   Haemophilus influenzae NOT DETECTED NOT DETECTED Final   Neisseria meningitidis NOT DETECTED NOT DETECTED Final   Pseudomonas aeruginosa NOT DETECTED NOT DETECTED Final   Candida albicans NOT DETECTED NOT DETECTED Final   Candida glabrata NOT DETECTED NOT DETECTED Final   Candida krusei NOT DETECTED NOT DETECTED Final   Candida parapsilosis NOT DETECTED NOT DETECTED Final   Candida tropicalis NOT DETECTED NOT DETECTED Final    Comment: Performed at [facility], [address] [place], [place] [address]  Culture, blood (routine x 2)     Status: None (Preliminary result)   Collection Time: 08/17/19  5:56 PM   Specimen: BLOOD  Result Value Ref Range Status   Specimen Description BLOOD RIGHT ANTECUBITAL  Final   Special Requests   Final    BOTTLES DRAWN AEROBIC AND ANAEROBIC Blood Culture adequate volume   Culture  Setup Time   Final    IN BOTH AEROBIC AND ANAEROBIC BOTTLES GRAM NEGATIVE RODS CRITICAL VALUE NOTED.  VALUE IS CONSISTENT WITH PREVIOUSLY REPORTED AND CALLED VALUE. Performed at [facility], [address] [place], [place] [address]    Culture GRAM NEGATIVE RODS  Final   Report  Status PENDING  Incomplete     Studies: Abd 1 View (KUB)  Result Date: 08/17/2019 CLINICAL DATA:  Constipation EXAM: ABDOMEN - 1 VIEW COMPARISON:  03/01/2019 FINDINGS: Gas throughout bowel. No evidence of bowel obstruction. No excessive stool burden. No free air no organomegaly. Postoperative changes in the lumbar spine. Aortic atherosclerosis. IMPRESSION: No acute findings. Electronically Signed   By: Charlett Nose M.D.   On: 08/17/2019 21:23   DG Chest Portable 1 View  Result Date: 08/17/2019 CLINICAL DATA:  Fever. EXAM: PORTABLE CHEST 1 VIEW COMPARISON:  February 27, 2019 FINDINGS: Heart size is stable. Aortic calcifications are noted. There is no pneumothorax. No large pleural effusion. There is some mild relatively stable pleural thickening along the inferior left chest wall. There is no focal infiltrate. IMPRESSION: No active disease. Electronically Signed   By: Katherine Mantle M.D.   On: 08/17/2019 19:04    Scheduled Meds: . atorvastatin  20 mg Oral QHS  . fentaNYL  1 patch Transdermal Q3 days  . folic acid  1 mg Oral Daily  . pantoprazole  40 mg Oral BID AC  . potassium chloride  40 mEq Oral Once   Continuous Infusions: . meropenem (MERREM) IV 1 g (08/18/19 0601)    Assessment/Plan:  1. Sepsis present on admission with E. coli.  Likely urine source.  Patient on meropenem.  Await cultures to come back with sensitivities to make sure we have her on the correct antibiotic. 2. Hypokalemia and hypomagnesemia this was replaced 3. History of gastric outlet obstruction status post Billroth gastrojejunostomy.  History of constipation. 4. Hyperlipidemia unspecified on atorvastatin 5. Chronic pain on fentanyl patch 6. Thrombocytopenia.  This seems chronic going on for a while.  Check a hepatitis C.     Code Status:     Code Status Orders  (From admission, onward)         Start     Ordered   08/17/19 2044  Do not attempt resuscitation (DNR)  Continuous       Question Answer  Comment  In the event of cardiac or respiratory ARREST Do not call a "code blue"   In the event of cardiac or respiratory ARREST Do not perform Intubation, CPR, defibrillation or ACLS  In the event of cardiac or respiratory ARREST Use medication by any route, position, wound care, and other measures to relive pain and suffering. May use oxygen, suction and manual treatment of airway obstruction as needed for comfort.      08/17/19 2046        Code Status History    Date Active Date Inactive Code Status Order ID Comments User Context   02/28/2019 0140 03/02/2019 2243 DNR [id number]  Andris Baumann, MD ED   02/27/2019 2327 02/28/2019 0140 Full Code [id number]  Andris Baumann, MD ED   06/03/2018 0048 06/08/2018 1951 Full Code [id number]  Enedina Finner, MD Inpatient   Advance Care Planning Activity    Advance Directive Documentation     Most Recent Value  Type of Advance Directive Healthcare Power of [facility]- daughter]  Pre-existing out of facility DNR order (yellow form or pink MOST form) --  "MOST" Form in Place? --     Family Communication: Patient's sister at the bedside Disposition Plan: Status is: Inpatient  Dispo: The patient is from: Home              Anticipated d/c is to: Home              Anticipated d/c date is: Dependent on her clinical course and when the sensitivities are back on the E. coli in the blood cultures.  Potentially 08/20/2019.              Patient currently being treated for sepsis and E. coli in the blood cultures with IV meropenem.  Antibiotics:  Meropenem  Time spent: 28 minutes  [employer]

## 2019-08-19 DIAGNOSIS — K311 Adult hypertrophic pyloric stenosis: Secondary | ICD-10-CM

## 2019-08-19 LAB — CBC
HCT: 32.1 % — ABNORMAL LOW (ref 36.0–46.0)
Hemoglobin: 11 g/dL — ABNORMAL LOW (ref 12.0–15.0)
MCH: 30.7 pg (ref 26.0–34.0)
MCHC: 34.3 g/dL (ref 30.0–36.0)
MCV: 89.7 fL (ref 80.0–100.0)
Platelets: 72 10*3/uL — ABNORMAL LOW (ref 150–400)
RBC: 3.58 MIL/uL — ABNORMAL LOW (ref 3.87–5.11)
RDW: 12.7 % (ref 11.5–15.5)
WBC: 6.9 10*3/uL (ref 4.0–10.5)
nRBC: 0 % (ref 0.0–0.2)

## 2019-08-19 LAB — GLUCOSE, CAPILLARY: Glucose-Capillary: 78 mg/dL (ref 70–99)

## 2019-08-19 LAB — BASIC METABOLIC PANEL
Anion gap: 5 (ref 5–15)
BUN: 11 mg/dL (ref 8–23)
CO2: 26 mmol/L (ref 22–32)
Calcium: 7.9 mg/dL — ABNORMAL LOW (ref 8.9–10.3)
Chloride: 104 mmol/L (ref 98–111)
Creatinine, Ser: 0.78 mg/dL (ref 0.44–1.00)
GFR calc Af Amer: >60 mL/min (ref >60)
GFR calc non Af Amer: >60 mL/min (ref >60)
Glucose, Bld: 129 mg/dL — ABNORMAL HIGH (ref 70–99)
Potassium: 4 mmol/L (ref 3.5–5.1)
Sodium: 135 mmol/L (ref 135–145)

## 2019-08-19 LAB — HEPATITIS C ANTIBODY: HCV Ab: NONREACTIVE

## 2019-08-19 LAB — PROCALCITONIN: Procalcitonin: 1.2 ng/mL

## 2019-08-19 MED ORDER — SENNOSIDES-DOCUSATE SODIUM 8.6-50 MG PO TABS
2.0000 | ORAL_TABLET | Freq: Two times a day (BID) | ORAL | Status: DC | PRN
  Administered 2019-08-19: 2 via ORAL
  Filled 2019-08-19: qty 2

## 2019-08-19 MED ORDER — SODIUM CHLORIDE 0.9 % IV BOLUS
500.0000 mL | Freq: Once | INTRAVENOUS | Status: AC
  Administered 2019-08-19: 500 mL via INTRAVENOUS

## 2019-08-19 NOTE — Evaluation (Signed)
Physical Therapy Evaluation Patient Details Name: [name] MRN: [id number] DOB: February 26, 1932 Today's Date: 08/19/2019   History of Present Illness  Per MD notes: Pt is an 86 y.o. 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 coli, Hypokalemia and hypomagnesemia, History of gastric outlet obstruction status post Billroth gastrojejunostomy, HLD, and Thrombocytopenia.    Clinical Impression  Pt pleasant and motivated to participate during the session.  Pt was Ind to Mod Ind with all functional tasks including amb 150' with a RW with good stability.  Pt reported no adverse symptoms during the session with SpO2 and HR WNL throughout on room air.  Pt and daughter reported that pt is at baseline functionally with no PT needs at this time. Pt stated that she walks 3x/day for exercise as well as participates in her HEP from recent HHPT. Will complete PT orders at this time but will reassess pt pending a change in status upon receipt of new PT orders.      Follow Up Recommendations No PT follow up    Equipment Recommendations  None recommended by PT    Recommendations for Other Services       Precautions / Restrictions Precautions Precautions: Fall Restrictions Weight Bearing Restrictions: No      Mobility  Bed Mobility Overal bed mobility: Independent                Transfers Overall transfer level: Modified independent Equipment used: Rolling walker (2 wheeled)             General transfer comment: Good eccentric and concentric control  Ambulation/Gait Ambulation/Gait assistance: Modified independent (Device/Increase time) Gait Distance  (Feet): 150 Feet Assistive device: Rolling walker (2 wheeled) Gait Pattern/deviations: Step-through pattern;Decreased step length - right;Decreased step length - left Gait velocity: Mildly decreased   General Gait Details: Pt steady during amb including during turns and navigating tight spaces  Stairs            Wheelchair Mobility    Modified Rankin (Stroke Patients Only)       Balance Overall balance assessment: No apparent balance deficits (not formally assessed)                                           Pertinent Vitals/Pain Pain Assessment: 0-10 Pain Score: 2  Pain Location: Low back, chronic per pt Pain Descriptors / Indicators: Aching;Sore Pain Intervention(s): Monitored during session    Home Living Family/patient expects to be discharged to:: Private residence Living Arrangements: Children;Other relatives Available Help at Discharge: Family;Available 24 hours/day Type of Home: House Home Access: Stairs to enter Entrance Stairs-Rails: Can reach both;Left;Right Entrance Stairs-Number of Steps: 2 Home Layout: One level Home Equipment: Walker - 2 wheels;Bedside commode      Prior Function Level of Independence: Needs assistance   Gait / Transfers Assistance Needed: Mod Ind amb with a RW limited community distances, no fall history  ADL's / Homemaking Assistance Needed: Occasional min A with bathing, takes sponge baths, Ind with dressing        Hand Dominance        Extremity/Trunk Assessment   Upper Extremity Assessment  Upper Extremity Assessment: Overall WFL for tasks assessed    Lower Extremity Assessment Lower Extremity Assessment: Overall WFL for tasks assessed       Communication   Communication: No difficulties  Cognition Arousal/Alertness: Awake/alert Behavior During Therapy: WFL for tasks assessed/performed Overall Cognitive Status: Within Functional Limits for tasks assessed                                         General Comments      Exercises     Assessment/Plan    PT Assessment Patent does not need any further PT services  PT Problem List         PT Treatment Interventions      PT Goals (Current goals can be found in the Care Plan section)  Acute Rehab PT Goals PT Goal Formulation: All assessment and education complete, DC therapy    Frequency     Barriers to discharge        Co-evaluation               AM-PAC PT "6 Clicks" Mobility  Outcome Measure Help needed turning from your back to your side while in a flat bed without using bedrails?: None Help needed moving from lying on your back to sitting on the side of a flat bed without using bedrails?: None Help needed moving to and from a bed to a chair (including a wheelchair)?: None Help needed standing up from a chair using your arms (e.g., wheelchair or bedside chair)?: None Help needed to walk in hospital room?: None Help needed climbing 3-5 steps with a railing? : None 6 Click Score: 24    End of Session Equipment Utilized During Treatment: Gait belt Activity Tolerance: Patient tolerated treatment well Patient left: in chair;with call bell/phone within reach;with chair alarm set;with family/visitor present;with SCD's reapplied Nurse Communication: Mobility status PT Visit Diagnosis: Muscle weakness (generalized) (M62.81)    Time: 1010-1038 PT Time Calculation (min) (ACUTE ONLY): 28 min   Charges:   PT Evaluation $PT Eval Low Complexity: 1 Low          D. Elly Modena PT, DPT 08/19/19, 12:23 PM

## 2019-08-19 NOTE — Progress Notes (Signed)
Patient ID: [name], female   DOB: 1932-04-30, 86 y.o.   MRN: [id number] [facility] PROGRESS NOTE  [name]:[id number] DOB: 1932/04/12 DOA: 08/17/2019 PCP: Marguarite Arbour, MD  HPI/Subjective: Patient was admitted with fever and altered mental status.  She feels okay.  Walked around with physical therapy today.  In speaking with the patient and patient's daughter she did have symptoms of urinary frequency prior to presentation.  Objective: Vitals:   08/19/19 0321 08/19/19 0729  BP: (!) 99/49 117/67  Pulse: 70 72  Resp: 16 18  Temp: 98.6 F (37 C) 98.4 F (36.9 C)  SpO2: 94% 100%    Intake/Output Summary (Last 24 hours) at 08/19/2019 1328 Last data filed at 08/19/2019 1030 Gross per 24 hour  Intake 3458.38 ml  Output --  Net 3458.38 ml   Filed Weights   08/17/19 1745 08/18/19 0038  Weight: 53.5 kg 53.2 kg    ROS: Review of Systems  Respiratory: Negative for shortness of breath.   Cardiovascular: Negative for chest pain.  Gastrointestinal: Negative for abdominal pain.   Exam: Physical Exam HENT:     Nose: No mucosal edema.     Mouth/Throat:     Pharynx: No oropharyngeal exudate.  Eyes:     General: Lids are normal.     Conjunctiva/sclera: Conjunctivae normal.     Pupils: Pupils are equal, round, and reactive to light.  Cardiovascular:     Rate and Rhythm: Normal rate and regular rhythm.     Heart sounds: S1 normal and S2 normal. No murmur heard.   Pulmonary:     Breath sounds: No wheezing, rhonchi or rales.  Abdominal:     Palpations: Abdomen is soft.     Tenderness: There is no abdominal tenderness.  Musculoskeletal:     Right ankle: No swelling.     Left ankle: No swelling.  Skin:    General: Skin is warm.     Findings: No rash.  Neurological:     Mental Status: She is alert and oriented to person, place, and time.     Data Reviewed: Basic Metabolic Panel: Recent Labs  Lab 08/17/19 1756 08/18/19 0511 08/19/19 0309  NA  137 140 135  K 3.3* 3.7 4.0  CL 103 104 104  CO2 23 27 26   GLUCOSE 167* 123* 129*  BUN 11 11 11   CREATININE 0.72 0.76 0.78  CALCIUM 8.2* 8.4* 7.9*  MG 1.4* 2.0  --    Liver Function Tests: Recent Labs  Lab 08/17/19 1756  AST 11*  ALT 11  ALKPHOS 59  BILITOT 1.4*  PROT 6.0*  ALBUMIN 3.3*   Recent Labs  Lab 08/17/19 1756  LIPASE 21   CBC: Recent Labs  Lab 08/17/19 1756 08/18/19 0511 08/19/19 0309  WBC 6.8 6.5 6.9  NEUTROABS 6.1  --   --   HGB 12.6 13.6 11.0*  HCT 37.5 41.0 32.1*  MCV 90.4 92.3 89.7  PLT 89* 84* 72*    CBG: Recent Labs  Lab 08/17/19 2031 08/19/19 0732  GLUCAP 181* 78    Recent Results (from the past 240 hour(s))  Culture, blood (routine x 2)     Status: Abnormal (Preliminary result)   Collection Time: 08/17/19  5:49 PM   Specimen: BLOOD  Result Value Ref Range Status   Specimen Description   Final    BLOOD BLOOD LEFT FOREARM Performed at [facility], [address] [place], [address] [place]    Special Requests  Final    BOTTLES DRAWN AEROBIC AND ANAEROBIC Blood Culture results may not be optimal due to an excessive volume of blood received in culture bottles Performed at [facility], [address] [place], [place] [address]    Culture  Setup Time   Final    Organism ID to follow IN BOTH AEROBIC AND ANAEROBIC BOTTLES GRAM NEGATIVE RODS CRITICAL RESULT CALLED TO, READ BACK BY AND VERIFIED WITH: SCOTT HALL ON 08/18/19 AT 0423 [facility] Performed at [facility], [address] [place], [place] [address]    Culture (A)  Final    ESCHERICHIA COLI SUSCEPTIBILITIES TO FOLLOW Performed at [facility], [address], [place], [place] [address]    Report Status PENDING  Incomplete  SARS Coronavirus 2 by RT PCR (hospital order, performed in [facility] hospital lab) Nasopharyngeal Nasopharyngeal Swab     Status: None   Collection Time: 08/17/19  5:49 PM   Specimen: Nasopharyngeal Swab  Result Value  Ref Range Status   SARS Coronavirus 2 NEGATIVE NEGATIVE Final    Comment: (NOTE) SARS-CoV-2 target nucleic acids are NOT DETECTED.  The SARS-CoV-2 RNA is generally detectable in upper and lower respiratory specimens during the acute phase of infection. The lowest concentration of SARS-CoV-2 viral copies this assay can detect is 250 copies / mL. A negative result does not preclude SARS-CoV-2 infection and should not be used as the sole basis for treatment or other patient management decisions.  A negative result may occur with improper specimen collection / handling, submission of specimen other than nasopharyngeal swab, presence of viral mutation(s) within the areas targeted by this assay, and inadequate number of viral copies (<250 copies / mL). A negative result must be combined with clinical observations, patient history, and epidemiological information.  Fact Sheet for Patients:   BoilerBrush.com.cy  Fact Sheet for Healthcare Providers: https://pope.com/  This test is not yet approved or  cleared by the [place] FDA and has been authorized for detection and/or diagnosis of SARS-CoV-2 by FDA under an Emergency Use Authorization (EUA).  This EUA will remain in effect (meaning this test can be used) for the duration of the COVID-19 declaration under Section 564(b)(1) of the Act, 21 U.S.C. section 360bbb-3(b)(1), unless the authorization is terminated or revoked sooner.  Performed at [facility], [address] [place], [place] [address]   Blood Culture ID Panel (Reflexed)     Status: Abnormal   Collection Time: 08/17/19  5:49 PM  Result Value Ref Range Status   Enterococcus species NOT DETECTED NOT DETECTED Final   Listeria monocytogenes NOT DETECTED NOT DETECTED Final   Staphylococcus species NOT DETECTED NOT DETECTED Final   Staphylococcus aureus (BCID) NOT DETECTED NOT DETECTED Final   Streptococcus species  NOT DETECTED NOT DETECTED Final   Streptococcus agalactiae NOT DETECTED NOT DETECTED Final   Streptococcus pneumoniae NOT DETECTED NOT DETECTED Final   Streptococcus pyogenes NOT DETECTED NOT DETECTED Final   Acinetobacter baumannii NOT DETECTED NOT DETECTED Final   Enterobacteriaceae species DETECTED (A) NOT DETECTED Final    Comment: Enterobacteriaceae represent a large family of gram-negative bacteria, not a single organism. CRITICAL RESULT CALLED TO, READ BACK BY AND VERIFIED WITH: SCOTT HALL ON 08/18/19 AT 0423 [facility]    Enterobacter cloacae complex NOT DETECTED NOT DETECTED Final   Escherichia coli DETECTED (A) NOT DETECTED Final    Comment: CRITICAL RESULT CALLED TO, READ BACK BY AND VERIFIED WITH: SCOTT HALL ON 08/18/19 AT 0423 [facility]  Klebsiella oxytoca NOT DETECTED NOT DETECTED Final   Klebsiella pneumoniae NOT DETECTED NOT DETECTED Final   Proteus species NOT DETECTED NOT DETECTED Final   Serratia marcescens NOT DETECTED NOT DETECTED Final   Carbapenem resistance NOT DETECTED NOT DETECTED Final   Haemophilus influenzae NOT DETECTED NOT DETECTED Final   Neisseria meningitidis NOT DETECTED NOT DETECTED Final   Pseudomonas aeruginosa NOT DETECTED NOT DETECTED Final   Candida albicans NOT DETECTED NOT DETECTED Final   Candida glabrata NOT DETECTED NOT DETECTED Final   Candida krusei NOT DETECTED NOT DETECTED Final   Candida parapsilosis NOT DETECTED NOT DETECTED Final   Candida tropicalis NOT DETECTED NOT DETECTED Final    Comment: Performed at [facility], [address] [place], [place] [address]  Culture, blood (routine x 2)     Status: Abnormal (Preliminary result)   Collection Time: 08/17/19  5:56 PM   Specimen: BLOOD  Result Value Ref Range Status   Specimen Description   Final    BLOOD RIGHT ANTECUBITAL Performed at [facility], [address] [place], [place] [address]    Special Requests   Final    BOTTLES DRAWN AEROBIC AND ANAEROBIC Blood  Culture adequate volume Performed at [facility], [address] [place], [place] [address]    Culture  Setup Time   Final    IN BOTH AEROBIC AND ANAEROBIC BOTTLES GRAM NEGATIVE RODS CRITICAL VALUE NOTED.  VALUE IS CONSISTENT WITH PREVIOUSLY REPORTED AND CALLED VALUE. Performed at [facility], [address] [place], [place] [address]    Culture ESCHERICHIA COLI (A)  Final   Report Status PENDING  Incomplete  Urine culture     Status: Abnormal (Preliminary result)   Collection Time: 08/17/19  6:26 PM   Specimen: Urine, Random  Result Value Ref Range Status   Specimen Description   Final    URINE, RANDOM Performed at [facility], [address] [place], [place] [address]    Special Requests   Final    NONE Performed at [facility], [address] [place], [place] [address]    Culture >=100,000 COLONIES/mL ESCHERICHIA COLI (A)  Final   Report Status PENDING  Incomplete     Studies: Abd 1 View (KUB)  Result Date: 08/17/2019 CLINICAL DATA:  Constipation EXAM: ABDOMEN - 1 VIEW COMPARISON:  03/01/2019 FINDINGS: Gas throughout bowel. No evidence of bowel obstruction. No excessive stool burden. No free air no organomegaly. Postoperative changes in the lumbar spine. Aortic atherosclerosis. IMPRESSION: No acute findings. Electronically Signed   By: Charlett NoseKevin  Dover M.D.   On: 08/17/2019 21:23   DG Chest Portable 1 View  Result Date: 08/17/2019 CLINICAL DATA:  Fever. EXAM: PORTABLE CHEST 1 VIEW COMPARISON:  February 27, 2019 FINDINGS: Heart size is stable. Aortic calcifications are noted. There is no pneumothorax. No large pleural effusion. There is some mild relatively stable pleural thickening along the inferior left chest wall. There is no focal infiltrate. IMPRESSION: No active disease. Electronically Signed   By: Katherine Mantlehristopher  Green M.D.   On: 08/17/2019 19:04    Scheduled Meds: . atorvastatin  20 mg Oral QHS  . fentaNYL  1 patch Transdermal  Q3 days  . folic acid  1 mg Oral Daily  . pantoprazole  40 mg Oral BID AC   Continuous Infusions: . sodium chloride 40 mL/hr at 08/19/19 0129  . meropenem (MERREM) IV 1 g (08/19/19 0659)    Assessment/Plan:  1. Sepsis present on admission with  E. coli.  Both blood cultures and urine cultures growing E. coli.  I spoke with the microbiology laboratory and they will have the sensitivities back tomorrow.  Anticipate the patient going home tomorrow on oral antibiotics. 2. Hypomagnesemia and hypokalemia both of these have been replaced into the normal range. 3. History of gastric outlet obstruction status post Billroth gastrojejunostomy.  History of alternating diarrhea and constipation. 4. Hyperlipidemia unspecified on atorvastatin 5. Chronic pain on fentanyl patch 6. Thrombocytopenia.  Seems chronic in nature.  Hepatitis C negative. 7. Patient did well today with physical therapy.   Code Status:     Code Status Orders  (From admission, onward)         Start     Ordered   08/17/19 2044  Do not attempt resuscitation (DNR)  Continuous       Question Answer Comment  In the event of cardiac or respiratory ARREST Do not call a "code blue"   In the event of cardiac or respiratory ARREST Do not perform Intubation, CPR, defibrillation or ACLS   In the event of cardiac or respiratory ARREST Use medication by any route, position, wound care, and other measures to relive pain and suffering. May use oxygen, suction and manual treatment of airway obstruction as needed for comfort.      08/17/19 2046        Code Status History    Date Active Date Inactive Code Status Order ID Comments User Context   02/28/2019 0140 03/02/2019 2243 DNR [id number]  Andris Baumann, MD ED   02/27/2019 2327 02/28/2019 0140 Full Code [id number]  Andris Baumann, MD ED   06/03/2018 0048 06/08/2018 1951 Full Code [id number]  Enedina Finner, MD Inpatient   Advance Care Planning Activity    Advance Directive Documentation      Most Recent Value  Type of Advance Directive Healthcare Power of Health [place]- daughter]  Pre-existing out of facility DNR order (yellow form or pink MOST form) --  "MOST" Form in Place? --     Family Communication: Patient's daughter at the bedside Disposition Plan: Status is: Inpatient  Dispo: The patient is from: Home              Anticipated d/c is to: Home              Anticipated d/c date is: 08/20/2019              Patient currently being treated for sepsis with E. coli in blood and urine cultures.  Sensitivities will not be back until tomorrow.  Anticipate discharge home after sensitivities back to make sure that we have her on the right treatment plan.  Antibiotics:  Meropenem  Time spent: 27 minutes  [employer]

## 2019-08-19 NOTE — Progress Notes (Signed)
Patient oxygen level was 75. Placed patient on 2L via [place]. Encouraged patient to take deep breaths. Oxygen currently at 94.

## 2019-08-19 NOTE — Progress Notes (Addendum)
Patient blood pressure low 94/34. Map 51. NP [place] notified. Received order to give a 500 bolus. Patient stated she felt fine. A/ox4. Will continue to monitor.

## 2019-08-20 LAB — URINE CULTURE: Culture: >=100000 — AB

## 2019-08-20 LAB — CULTURE, BLOOD (ROUTINE X 2): Special Requests: ADEQUATE

## 2019-08-20 LAB — GLUCOSE, CAPILLARY: Glucose-Capillary: 123 mg/dL — ABNORMAL HIGH (ref 70–99)

## 2019-08-20 MED ORDER — SODIUM CHLORIDE 0.9 % IV SOLN
2.0000 g | INTRAVENOUS | Status: DC
  Filled 2019-08-20: qty 20

## 2019-08-20 MED ORDER — PANTOPRAZOLE SODIUM 40 MG PO TBEC
40.0000 mg | DELAYED_RELEASE_TABLET | Freq: Two times a day (BID) | ORAL | 1 refills | Status: DC
Start: 2019-08-20 — End: 2019-12-29

## 2019-08-20 MED ORDER — CEPHALEXIN 500 MG PO CAPS: 500.0000 mg | ORAL_CAPSULE | Freq: Four times a day (QID) | ORAL | 0 refills | Status: AC

## 2019-08-20 MED ORDER — CEFDINIR 300 MG PO CAPS: 300.0000 mg | ORAL_CAPSULE | Freq: Two times a day (BID) | ORAL | 0 refills | Status: DC

## 2019-08-20 NOTE — Progress Notes (Signed)
Patient is stable and ready for discharge. Patient's IV removed. Writer went over discharge paperwork with patient, [name] and Drue and all verbalized understanding of discharge instructions and had no questions. Patient dressed by NT and transported via [facility] to private car with Drue for home discharge. Patient was discharged with her fentanyl patch located to right chest intact. Patient's belongings taken by Drue to car.

## 2019-08-20 NOTE — Discharge Summary (Signed)
Physician Discharge Summary  [name] ZOX:[id number] DOB: April 28, 1932 DOA: 08/17/2019  PCP: Marguarite Arbour, MD  Admit date: 08/17/2019 Discharge date: 08/20/2019  Admitted From: home Disposition:  home  Recommendations for Outpatient Follow-up:  1. Follow up with PCP in 1-2 weeks 2. Please obtain BMP/CBC in one week  Home Health: None recommended  Equipment/Devices: None   Discharge Condition: Stable  CODE STATUS: DNR  Diet recommendation: Dysphagia level 1 (puree)  Discharge Diagnoses: Principal Problem:   Febrile illness Active Problems:   Gastric outlet obstruction   Diabetes mellitus type 2, uncomplicated (HCC)   Hyperlipidemia   Hypertension   E. coli sepsis (HCC)   Hypokalemia   Hypomagnesemia   Thrombocytopenia (HCC)    Summary of HPI and Hospital Course:   [name] is a 86 y.o. 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  Patient reportedly had temp of 101 F with EMS.  In the ED, temp up to 103.1 F, spO2 90% on room air, BP 124/51.  Labs showed no leukocytosis, thrombocytopenia, normal renal function, mild hypokalemia, normal lactic acid and procalcitonin.  UA was positive for infection and RBC's.  Treated in the ED with IV fluids and Rocephin.  Admitted to hospitalist service for further management of sepsis secondary to UTI.   She was continued on empiric Rocephin.  Blood and urine cultures grew E. Coli, sensitive to cephalosporins.  Mental status returned to baseline with treatment of infection.  Patient was discharged home in stable condition with 4 1/2 additional days of Keflex to complete the course of therapy.   Discharge  Instructions   Discharge Instructions    Call MD for:  extreme fatigue   Complete by: As directed    Call MD for:  persistant dizziness or light-headedness   Complete by: As directed    Call MD for:  persistant nausea and vomiting   Complete by: As directed    Call MD for:  temperature >100.4   Complete by: As directed    Diet - low sodium heart healthy   Complete by: As directed    Discharge instructions   Complete by: As directed    Take Omnicef (aka cefdinir) antibiotic twice per day, starting this evening with dinner. Take antibiotic until completely finished.  Do not stop early even if feeling better.   Increase activity slowly   Complete by: As directed      Allergies as of 08/20/2019      Reactions   Colesevelam Other (See Comments)   Lovastatin Other (See Comments)   Tetracyclines & Related Rash      Medication List    TAKE these medications   atorvastatin 20 MG tablet Commonly known as: LIPITOR Take 20 mg by mouth at bedtime.   cephALEXin 500 MG capsule Commonly known as: KEFLEX Take 1 capsule (500 mg total) by mouth 4 (four) times daily for 6 days.   fentaNYL 100 MCG/HR Commonly known as: DURAGESIC Place 1 patch onto the skin every 3 (three) days.   Fiber 625 MG Tabs Take by mouth.   folic acid 1 MG tablet Commonly known as: FOLVITE Take 1 mg by mouth daily.   pantoprazole 40 MG tablet Commonly known as: PROTONIX Take 1 tablet (  40 mg total) by mouth 2 (two) times daily before a meal. What changed: Another medication with the same name was removed. Continue taking this medication, and follow the directions you see here.   VITAMIN B-12 PO Take 1 tablet by mouth daily.       Allergies  Allergen Reactions  . Colesevelam Other (See Comments)  . Lovastatin Other (See Comments)  . Tetracyclines & Related Rash    Consultations:  None    Procedures/Studies: Abd 1 View (KUB)  Result Date: 08/17/2019 CLINICAL DATA:  Constipation EXAM: ABDOMEN  - 1 VIEW COMPARISON:  03/01/2019 FINDINGS: Gas throughout bowel. No evidence of bowel obstruction. No excessive stool burden. No free air no organomegaly. Postoperative changes in the lumbar spine. Aortic atherosclerosis. IMPRESSION: No acute findings. Electronically Signed   By: Charlett Nose M.D.   On: 08/17/2019 21:23   DG Chest Portable 1 View  Result Date: 08/17/2019 CLINICAL DATA:  Fever. EXAM: PORTABLE CHEST 1 VIEW COMPARISON:  February 27, 2019 FINDINGS: Heart size is stable. Aortic calcifications are noted. There is no pneumothorax. No large pleural effusion. There is some mild relatively stable pleural thickening along the inferior left chest wall. There is no focal infiltrate. IMPRESSION: No active disease. Electronically Signed   By: Katherine Mantle M.D.   On: 08/17/2019 19:04       Subjective: Patient seen this AM.  Denies acute complaints including dysuria, urinary frequency, poor appetite, fevers or chills.  Says ready to go home.     Discharge Exam: Vitals:   08/20/19 0019 08/20/19 0718  BP: (!) 100/36 119/66  Pulse: 67 75  Resp: 18 18  Temp: 98.9 F (37.2 C) 98 F (36.7 C)  SpO2: 94% 96%   Vitals:   08/19/19 0321 08/19/19 0729 08/20/19 0019 08/20/19 0718  BP: (!) 99/49 117/67 (!) 100/36 119/66  Pulse: 70 72 67 75  Resp: 16 18 18 18   Temp: 98.6 F (37 C) 98.4 F (36.9 C) 98.9 F (37.2 C) 98 F (36.7 C)  TempSrc: Oral Oral Oral Oral  SpO2: 94% 100% 94% 96%  Weight:      Height:        General: Pt is alert, awake, not in acute distress Cardiovascular: RRR, S1/S2 +, no rubs, no gallops Respiratory: CTA bilaterally, no wheezing, no rhonchi Abdominal: Soft, NT, ND, bowel sounds + Extremities: no edema, no cyanosis    The results of significant diagnostics from this hospitalization (including imaging, microbiology, ancillary and laboratory) are listed below for reference.     Microbiology: Recent Results (from the past 240 hour(s))  Culture, blood  (routine x 2)     Status: Abnormal   Collection Time: 08/17/19  5:49 PM   Specimen: BLOOD  Result Value Ref Range Status   Specimen Description   Final    BLOOD BLOOD LEFT FOREARM Performed at [facility], [address], [place], [place]    Special Requests   Final    BOTTLES DRAWN AEROBIC AND ANAEROBIC Blood Culture results may not be optimal due to an excessive volume of blood received in culture bottles Performed at [facility], [address] [place], [place]    Culture  Setup Time   Final    IN BOTH AEROBIC AND ANAEROBIC BOTTLES GRAM NEGATIVE RODS CRITICAL RESULT CALLED TO, READ BACK BY AND VERIFIED WITH: SCOTT HALL ON 08/18/19 AT 0423 [facility] Performed at [facility], [address], [place], [place]  Culture ESCHERICHIA COLI (A)  Final   Report Status 08/20/2019 FINAL  Final   Organism ID, Bacteria ESCHERICHIA COLI  Final      Susceptibility   Escherichia coli - MIC*    AMPICILLIN >=32 RESISTANT Resistant     CEFAZOLIN <=4 SENSITIVE Sensitive     CEFEPIME <=0.12 SENSITIVE Sensitive     CEFTAZIDIME <=1 SENSITIVE Sensitive     CEFTRIAXONE <=0.25 SENSITIVE Sensitive     CIPROFLOXACIN <=0.25 SENSITIVE Sensitive     GENTAMICIN <=1 SENSITIVE Sensitive     IMIPENEM <=0.25 SENSITIVE Sensitive     TRIMETH/SULFA <=20 SENSITIVE Sensitive     AMPICILLIN/SULBACTAM >=32 RESISTANT Resistant     PIP/TAZO <=4 SENSITIVE Sensitive     * ESCHERICHIA COLI  SARS Coronavirus 2 by RT PCR (hospital order, performed in [facility] hospital lab) Nasopharyngeal Nasopharyngeal Swab     Status: None   Collection Time: 08/17/19  5:49 PM   Specimen: Nasopharyngeal Swab  Result Value Ref Range Status   SARS Coronavirus 2 NEGATIVE NEGATIVE Final    Comment: (NOTE) SARS-CoV-2 target nucleic acids are NOT DETECTED.  The SARS-CoV-2 RNA is generally detectable in upper and lower respiratory specimens during the acute phase of infection. The  lowest concentration of SARS-CoV-2 viral copies this assay can detect is 250 copies / mL. A negative result does not preclude SARS-CoV-2 infection and should not be used as the sole basis for treatment or other patient management decisions.  A negative result may occur with improper specimen collection / handling, submission of specimen other than nasopharyngeal swab, presence of viral mutation(s) within the areas targeted by this assay, and inadequate number of viral copies (<250 copies / mL). A negative result must be combined with clinical observations, patient history, and epidemiological information.  Fact Sheet for Patients:   BoilerBrush.com.cy  Fact Sheet for Healthcare Providers: https://pope.com/  This test is not yet approved or  cleared by the [place] FDA and has been authorized for detection and/or diagnosis of SARS-CoV-2 by FDA under an Emergency Use Authorization (EUA).  This EUA will remain in effect (meaning this test can be used) for the duration of the COVID-19 declaration under Section 564(b)(1) of the Act, 21 U.S.C. section 360bbb-3(b)(1), unless the authorization is terminated or revoked sooner.  Performed at [facility], [address] [place], [place] [address]   Blood Culture ID Panel (Reflexed)     Status: Abnormal   Collection Time: 08/17/19  5:49 PM  Result Value Ref Range Status   Enterococcus species NOT DETECTED NOT DETECTED Final   Listeria monocytogenes NOT DETECTED NOT DETECTED Final   Staphylococcus species NOT DETECTED NOT DETECTED Final   Staphylococcus aureus (BCID) NOT DETECTED NOT DETECTED Final   Streptococcus species NOT DETECTED NOT DETECTED Final   Streptococcus agalactiae NOT DETECTED NOT DETECTED Final   Streptococcus pneumoniae NOT DETECTED NOT DETECTED Final   Streptococcus pyogenes NOT DETECTED NOT DETECTED Final   Acinetobacter baumannii NOT DETECTED NOT DETECTED  Final   Enterobacteriaceae species DETECTED (A) NOT DETECTED Final    Comment: Enterobacteriaceae represent a large family of gram-negative bacteria, not a single organism. CRITICAL RESULT CALLED TO, READ BACK BY AND VERIFIED WITH: SCOTT HALL ON 08/18/19 AT 0423 [facility]    Enterobacter cloacae complex NOT DETECTED NOT DETECTED Final   Escherichia coli DETECTED (A) NOT DETECTED Final    Comment: CRITICAL RESULT CALLED TO, READ BACK BY AND VERIFIED WITH: SCOTT HALL ON 08/18/19 AT 0423 [facility]  Klebsiella oxytoca NOT DETECTED NOT DETECTED Final   Klebsiella pneumoniae NOT DETECTED NOT DETECTED Final   Proteus species NOT DETECTED NOT DETECTED Final   Serratia marcescens NOT DETECTED NOT DETECTED Final   Carbapenem resistance NOT DETECTED NOT DETECTED Final   Haemophilus influenzae NOT DETECTED NOT DETECTED Final   Neisseria meningitidis NOT DETECTED NOT DETECTED Final   Pseudomonas aeruginosa NOT DETECTED NOT DETECTED Final   Candida albicans NOT DETECTED NOT DETECTED Final   Candida glabrata NOT DETECTED NOT DETECTED Final   Candida krusei NOT DETECTED NOT DETECTED Final   Candida parapsilosis NOT DETECTED NOT DETECTED Final   Candida tropicalis NOT DETECTED NOT DETECTED Final    Comment: Performed at [facility], [address] [place], [place] [address]  Culture, blood (routine x 2)     Status: Abnormal   Collection Time: 08/17/19  5:56 PM   Specimen: BLOOD  Result Value Ref Range Status   Specimen Description   Final    BLOOD RIGHT ANTECUBITAL Performed at [facility], [address] [place], [place] [address]    Special Requests   Final    BOTTLES DRAWN AEROBIC AND ANAEROBIC Blood Culture adequate volume Performed at [facility], [address] [place], [place] [address]    Culture  Setup Time   Final    IN BOTH AEROBIC AND ANAEROBIC BOTTLES GRAM NEGATIVE RODS CRITICAL VALUE NOTED.  VALUE IS CONSISTENT WITH PREVIOUSLY REPORTED AND CALLED  VALUE. Performed at [facility], [address] [place], [place] [address]    Culture (A)  Final    ESCHERICHIA COLI SUSCEPTIBILITIES PERFORMED ON PREVIOUS CULTURE WITHIN THE LAST 5 DAYS. Performed at [facility], [address], [place], [place] [address]    Report Status 08/20/2019 FINAL  Final  Urine culture     Status: Abnormal   Collection Time: 08/17/19  6:26 PM   Specimen: Urine, Random  Result Value Ref Range Status   Specimen Description   Final    URINE, RANDOM Performed at [facility], [address], [place], [place] [address]    Special Requests   Final    NONE Performed at [facility], [address] [place], [place] [address]    Culture >=100,000 COLONIES/mL ESCHERICHIA COLI (A)  Final   Report Status 08/20/2019 FINAL  Final   Organism ID, Bacteria ESCHERICHIA COLI (A)  Final      Susceptibility   Escherichia coli - MIC*    AMPICILLIN >=32 RESISTANT Resistant     CEFAZOLIN <=4 SENSITIVE Sensitive     CEFTRIAXONE <=0.25 SENSITIVE Sensitive     CIPROFLOXACIN <=0.25 SENSITIVE Sensitive     GENTAMICIN <=1 SENSITIVE Sensitive     IMIPENEM <=0.25 SENSITIVE Sensitive     NITROFURANTOIN <=16 SENSITIVE Sensitive     TRIMETH/SULFA <=20 SENSITIVE Sensitive     AMPICILLIN/SULBACTAM >=32 RESISTANT Resistant     PIP/TAZO <=4 SENSITIVE Sensitive     * >=100,000 COLONIES/mL ESCHERICHIA COLI     Labs: BNP (last 3 results) No results for input(s): BNP in the last 8760 hours. Basic Metabolic Panel: Recent Labs  Lab 08/17/19 1756 08/18/19 0511 08/19/19 0309  NA 137 140 135  K 3.3* 3.7 4.0  CL 103 104 104  CO2 23 27 26   GLUCOSE 167* 123* 129*  BUN 11 11 11   CREATININE 0.72 0.76 0.78  CALCIUM 8.2* 8.4* 7.9*  MG 1.4* 2.0  --    Liver Function Tests: Recent  Labs  Lab 08/17/19 1756  AST 11*  ALT 11  ALKPHOS 59  BILITOT 1.4*  PROT 6.0*  ALBUMIN 3.3*   Recent Labs  Lab 08/17/19 1756  LIPASE 21   No results for  input(s): AMMONIA in the last 168 hours. CBC: Recent Labs  Lab 08/17/19 1756 08/18/19 0511 08/19/19 0309  WBC 6.8 6.5 6.9  NEUTROABS 6.1  --   --   HGB 12.6 13.6 11.0*  HCT 37.5 41.0 32.1*  MCV 90.4 92.3 89.7  PLT 89* 84* 72*   Cardiac Enzymes: No results for input(s): CKTOTAL, CKMB, CKMBINDEX, TROPONINI in the last 168 hours. BNP: Invalid input(s): POCBNP CBG: Recent Labs  Lab 08/17/19 2031 08/19/19 0732 08/20/19 0731  GLUCAP 181* 78 123*   D-Dimer No results for input(s): DDIMER in the last 72 hours. Hgb A1c No results for input(s): HGBA1C in the last 72 hours. Lipid Profile No results for input(s): CHOL, HDL, LDLCALC, TRIG, CHOLHDL, LDLDIRECT in the last 72 hours. Thyroid function studies Recent Labs    08/17/19 1756  TSH 0.688   Anemia work up No results for input(s): VITAMINB12, FOLATE, FERRITIN, TIBC, IRON, RETICCTPCT in the last 72 hours. Urinalysis    Component Value Date/Time   COLORURINE YELLOW (A) 08/17/2019 1826   APPEARANCEUR TURBID (A) 08/17/2019 1826   LABSPEC 1.015 08/17/2019 1826   PHURINE 5.0 08/17/2019 1826   GLUCOSEU NEGATIVE 08/17/2019 1826   HGBUR LARGE (A) 08/17/2019 1826   BILIRUBINUR NEGATIVE 08/17/2019 1826   KETONESUR NEGATIVE 08/17/2019 1826   PROTEINUR 100 (A) 08/17/2019 1826   UROBILINOGEN 0.2 08/03/2006 1158   NITRITE NEGATIVE 08/17/2019 1826   LEUKOCYTESUR LARGE (A) 08/17/2019 1826   Sepsis Labs Invalid input(s): PROCALCITONIN,  WBC,  LACTICIDVEN Microbiology Recent Results (from the past 240 hour(s))  Culture, blood (routine x 2)     Status: Abnormal   Collection Time: 08/17/19  5:49 PM   Specimen: BLOOD  Result Value Ref Range Status   Specimen Description   Final    BLOOD BLOOD LEFT FOREARM Performed at [facility], [address] [place], [place] [address]    Special Requests   Final    BOTTLES DRAWN AEROBIC AND ANAEROBIC Blood Culture results may not be optimal due to an excessive volume of blood  received in culture bottles Performed at [facility], [address] [place], [place] [address]    Culture  Setup Time   Final    IN BOTH AEROBIC AND ANAEROBIC BOTTLES GRAM NEGATIVE RODS CRITICAL RESULT CALLED TO, READ BACK BY AND VERIFIED WITH: SCOTT HALL ON 08/18/19 AT 0423 [facility] Performed at [facility], [address], [place], [place] [address]    Culture ESCHERICHIA COLI (A)  Final   Report Status 08/20/2019 FINAL  Final   Organism ID, Bacteria ESCHERICHIA COLI  Final      Susceptibility   Escherichia coli - MIC*    AMPICILLIN >=32 RESISTANT Resistant     CEFAZOLIN <=4 SENSITIVE Sensitive     CEFEPIME <=0.12 SENSITIVE Sensitive     CEFTAZIDIME <=1 SENSITIVE Sensitive     CEFTRIAXONE <=0.25 SENSITIVE Sensitive     CIPROFLOXACIN <=0.25 SENSITIVE Sensitive     GENTAMICIN <=1 SENSITIVE Sensitive     IMIPENEM <=0.25 SENSITIVE Sensitive     TRIMETH/SULFA <=20 SENSITIVE Sensitive     AMPICILLIN/SULBACTAM >=32 RESISTANT Resistant     PIP/TAZO <=4 SENSITIVE Sensitive     * ESCHERICHIA COLI  SARS Coronavirus 2 by RT  PCR (hospital order, performed in [facility] hospital lab) Nasopharyngeal Nasopharyngeal Swab     Status: None   Collection Time: 08/17/19  5:49 PM   Specimen: Nasopharyngeal Swab  Result Value Ref Range Status   SARS Coronavirus 2 NEGATIVE NEGATIVE Final    Comment: (NOTE) SARS-CoV-2 target nucleic acids are NOT DETECTED.  The SARS-CoV-2 RNA is generally detectable in upper and lower respiratory specimens during the acute phase of infection. The lowest concentration of SARS-CoV-2 viral copies this assay can detect is 250 copies / mL. A negative result does not preclude SARS-CoV-2 infection and should not be used as the sole basis for treatment or other patient management decisions.  A negative result may occur with improper specimen collection / handling, submission of specimen other than nasopharyngeal swab, presence of viral mutation(s) within  the areas targeted by this assay, and inadequate number of viral copies (<250 copies / mL). A negative result must be combined with clinical observations, patient history, and epidemiological information.  Fact Sheet for Patients:   BoilerBrush.com.cy  Fact Sheet for Healthcare Providers: https://pope.com/  This test is not yet approved or  cleared by the [place] FDA and has been authorized for detection and/or diagnosis of SARS-CoV-2 by FDA under an Emergency Use Authorization (EUA).  This EUA will remain in effect (meaning this test can be used) for the duration of the COVID-19 declaration under Section 564(b)(1) of the Act, 21 U.S.C. section 360bbb-3(b)(1), unless the authorization is terminated or revoked sooner.  Performed at [facility], [address] [place], [place] [address]   Blood Culture ID Panel (Reflexed)     Status: Abnormal   Collection Time: 08/17/19  5:49 PM  Result Value Ref Range Status   Enterococcus species NOT DETECTED NOT DETECTED Final   Listeria monocytogenes NOT DETECTED NOT DETECTED Final   Staphylococcus species NOT DETECTED NOT DETECTED Final   Staphylococcus aureus (BCID) NOT DETECTED NOT DETECTED Final   Streptococcus species NOT DETECTED NOT DETECTED Final   Streptococcus agalactiae NOT DETECTED NOT DETECTED Final   Streptococcus pneumoniae NOT DETECTED NOT DETECTED Final   Streptococcus pyogenes NOT DETECTED NOT DETECTED Final   Acinetobacter baumannii NOT DETECTED NOT DETECTED Final   Enterobacteriaceae species DETECTED (A) NOT DETECTED Final    Comment: Enterobacteriaceae represent a large family of gram-negative bacteria, not a single organism. CRITICAL RESULT CALLED TO, READ BACK BY AND VERIFIED WITH: SCOTT HALL ON 08/18/19 AT 0423 [facility]    Enterobacter cloacae complex NOT DETECTED NOT DETECTED Final   Escherichia coli DETECTED (A) NOT DETECTED Final    Comment: CRITICAL  RESULT CALLED TO, READ BACK BY AND VERIFIED WITH: SCOTT HALL ON 08/18/19 AT 0423 [facility]    Klebsiella oxytoca NOT DETECTED NOT DETECTED Final   Klebsiella pneumoniae NOT DETECTED NOT DETECTED Final   Proteus species NOT DETECTED NOT DETECTED Final   Serratia marcescens NOT DETECTED NOT DETECTED Final   Carbapenem resistance NOT DETECTED NOT DETECTED Final   Haemophilus influenzae NOT DETECTED NOT DETECTED Final   Neisseria meningitidis NOT DETECTED NOT DETECTED Final   Pseudomonas aeruginosa NOT DETECTED NOT DETECTED Final   Candida albicans NOT DETECTED NOT DETECTED Final   Candida glabrata NOT DETECTED NOT DETECTED Final   Candida krusei NOT DETECTED NOT DETECTED Final   Candida parapsilosis NOT DETECTED NOT DETECTED Final   Candida tropicalis NOT DETECTED NOT DETECTED Final    Comment: Performed at [facility], [address], [place], [place] [address]  Culture, blood (  routine x 2)     Status: Abnormal   Collection Time: 08/17/19  5:56 PM   Specimen: BLOOD  Result Value Ref Range Status   Specimen Description   Final    BLOOD RIGHT ANTECUBITAL Performed at [facility], [address] [place], [place] [address]    Special Requests   Final    BOTTLES DRAWN AEROBIC AND ANAEROBIC Blood Culture adequate volume Performed at [facility], [address] [place], [place] [address]    Culture  Setup Time   Final    IN BOTH AEROBIC AND ANAEROBIC BOTTLES GRAM NEGATIVE RODS CRITICAL VALUE NOTED.  VALUE IS CONSISTENT WITH PREVIOUSLY REPORTED AND CALLED VALUE. Performed at [facility], [address] [place], [place] [address]    Culture (A)  Final    ESCHERICHIA COLI SUSCEPTIBILITIES PERFORMED ON PREVIOUS CULTURE WITHIN THE LAST 5 DAYS. Performed at [facility], [address] [place], [place] [address]    Report Status 08/20/2019 FINAL  Final  Urine culture     Status: Abnormal   Collection Time: 08/17/19  6:26 PM   Specimen: Urine,  Random  Result Value Ref Range Status   Specimen Description   Final    URINE, RANDOM Performed at [facility], [address] [place], [place] [address]    Special Requests   Final    NONE Performed at [facility], [address] [place], [place] [address]    Culture >=100,000 COLONIES/mL ESCHERICHIA COLI (A)  Final   Report Status 08/20/2019 FINAL  Final   Organism ID, Bacteria ESCHERICHIA COLI (A)  Final      Susceptibility   Escherichia coli - MIC*    AMPICILLIN >=32 RESISTANT Resistant     CEFAZOLIN <=4 SENSITIVE Sensitive     CEFTRIAXONE <=0.25 SENSITIVE Sensitive     CIPROFLOXACIN <=0.25 SENSITIVE Sensitive     GENTAMICIN <=1 SENSITIVE Sensitive     IMIPENEM <=0.25 SENSITIVE Sensitive     NITROFURANTOIN <=16 SENSITIVE Sensitive     TRIMETH/SULFA <=20 SENSITIVE Sensitive     AMPICILLIN/SULBACTAM >=32 RESISTANT Resistant     PIP/TAZO <=4 SENSITIVE Sensitive     * >=100,000 COLONIES/mL ESCHERICHIA COLI     Time coordinating discharge: Over 30 minutes  SIGNED:   Pennie BanterKelly A Calleigh Lafontant, DO [facility] 08/20/2019, 2:24 PM   If 7PM-7AM, please contact night-coverage [email or web address]

## 2019-10-16 ENCOUNTER — Encounter: Payer: Self-pay | Admitting: Gastroenterology

## 2019-10-16 ENCOUNTER — Ambulatory Visit (INDEPENDENT_AMBULATORY_CARE_PROVIDER_SITE_OTHER): Payer: Medicare PPO | Admitting: Gastroenterology

## 2019-10-16 ENCOUNTER — Other Ambulatory Visit: Payer: Self-pay

## 2019-10-16 VITALS — BP 141/68 | HR 61 | Temp 98.8°F | Ht 63.0 in | Wt 114.0 lb

## 2019-10-16 DIAGNOSIS — K311 Adult hypertrophic pyloric stenosis: Secondary | ICD-10-CM | POA: Diagnosis not present

## 2019-10-16 DIAGNOSIS — Z980 Intestinal bypass and anastomosis status: Secondary | ICD-10-CM

## 2019-10-16 DIAGNOSIS — K9189 Other postprocedural complications and disorders of digestive system: Secondary | ICD-10-CM | POA: Diagnosis not present

## 2019-10-16 NOTE — Progress Notes (Signed)
Brittany Repress, MD [address]  [address]  [place], [place] [address]  Main: [phone]  Fax: [phone]    Gastroenterology Consultation  Referring Provider:     Marguarite Arbour, MD Primary Care Physician:  Marguarite Arbour, MD Primary Gastroenterologist:  Dr. Maximino Greenland Reason for Consultation:   Gastrojejunal anastomotic stricture        HPI:   [name] is a 86 y.o. female referred by Dr. 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 Patient has history of gastrojejunostomy by Dr. Lemar Livings in 2011.  She subsequently developed anastomotic stricture that resulted in partial gastric outlet obstruction that required dilation in 06/05/2018 and most recently again when she was admitted to [facility] in 02/2019 to 71mm.  Patient recovered well and transition to soft food, tolerated well.  She also has chronic constipation Patient has been evaluated by Dr. Everlene Farrier and previously by Dr. Aleen Campi and patient and her daughter are reluctant to undergo surgical resection given her age.  Interval summary Patient's weight is 112 pounds which has been stable and slightly improved.  Patient is tolerating soft diet, well managed by her daughter.  She does have irregular bowel habits for which she takes fiber supplements.  Patient denies abdominal pain, nausea, vomiting, bloating.  She is working with physical therapy and able to ambulate to a limited extent Patient is currently taking Protonix 40 mg 2 times a day  Follow-up visit 06/12/2019 Ms. Cangemi is accompanied by her daughter today.  She underwent upper endoscopy in 03/2019 which revealed Billroth II gastrojejunostomy with no evidence of ulceration.  She did have a kink/tight turn at the anastomosis causing symptoms of gastric outlet obstruction.  Patient has been on acid suppressive therapy, strictly following mechanical soft/ pured diet and has been  tolerating it very well.  She reports also having regular bowel movements.  She gained 3 pounds since last visit, current weight is 115 LBS. Most recent labs including CBC and CMP are unremarkable, hemoglobin A1c is mildly elevated, TSH normal  Follow-up visit 10/16/2019 Patient was hospitalized secondary to UTI in 6/21.  She is currently doing well from GI standpoint.  She is not only on pured diet but also eating mashed foods and tolerating well.  Her weight has been stable.  She does have regular bowel movements, taking MiraLAX once daily.  She continues to take Protonix 40 mg twice daily.  Patient is accompanied by her daughter today  NSAIDs: None  Antiplts/Anticoagulants/Anti thrombotics: None  GI Procedures:  EGD 02/28/2019 EGD 06/05/2018 EGD 06/04/2018 Upper endoscopy 04/08/2019 - Stenosed Billroth II gastrojejunostomy was found, characterized by friable mucosa. - A medium amount of food (residue) in the stomach. - Normal stomach. Biopsied. - LA Grade A reflux esophagitis with no bleeding. - Esophagogastric landmarks identified.   Past Medical History:  Diagnosis Date  . Acute esophagitis   . GERD (gastroesophageal reflux disease)   . High cholesterol   . Hypertension   . Ileus (HCC) 06/02/2018  . Long term prescription opiate use 08/22/2017  . Uses walker   . Vertigo    long ago.  no current issues  . Weakness of left foot   . Wears hearing aid in both ears     Past Surgical History:  Procedure Laterality Date  . ABDOMINAL SURGERY    . BACK SURGERY    . BIOPSY N/A 04/08/2019   Procedure: BIOPSY;  Surgeon: Lannette Donath  Betti Cruz, MD;  [facility];  Service: Endoscopy;  Laterality: N/A;  . CHOLECYSTECTOMY    . ESOPHAGOGASTRODUODENOSCOPY N/A 06/04/2018   Procedure: ESOPHAGOGASTRODUODENOSCOPY (EGD);  Surgeon: Pasty Spillers, MD;  Location: [facility] ENDOSCOPY;  Service: Endoscopy;  Laterality: N/A;  . ESOPHAGOGASTRODUODENOSCOPY N/A 06/05/2018   Procedure:  ESOPHAGOGASTRODUODENOSCOPY (EGD);  Surgeon: Midge Minium, MD;  Location: [facility] ENDOSCOPY;  Service: Endoscopy;  Laterality: N/A;  use Fluoro bed, X-ray is aware  . ESOPHAGOGASTRODUODENOSCOPY N/A 02/28/2019   Procedure: ESOPHAGOGASTRODUODENOSCOPY (EGD);  Surgeon: Pasty Spillers, MD;  [facility];  Service: Endoscopy;  Laterality: N/A;  . ESOPHAGOGASTRODUODENOSCOPY (EGD) WITH PROPOFOL N/A 04/08/2019   Procedure: ESOPHAGOGASTRODUODENOSCOPY (EGD) WITH PROPOFOL;  Surgeon: Toney Reil, MD;  Location: [facility];  Service: Endoscopy;  Laterality: N/A;  priority 4  . JOINT REPLACEMENT    . REPLACEMENT TOTAL KNEE Right   . TOE AMPUTATION Left    great toe  . TONSILLECTOMY      Current Outpatient Medications:  .  atorvastatin (LIPITOR) 20 MG tablet, Take 20 mg by mouth at bedtime. , Disp: , Rfl:  .  Cyanocobalamin (VITAMIN B-12 PO), Take 1 tablet by mouth daily. , Disp: , Rfl:  .  fentaNYL (DURAGESIC) 100 MCG/HR, Place 1 patch onto the skin every 3 (three) days., Disp: , Rfl:  .  folic acid (FOLVITE) 1 MG tablet, Take 1 mg by mouth daily. , Disp: , Rfl:  .  pantoprazole (PROTONIX) 40 MG tablet, Take 1 tablet (40 mg total) by mouth 2 (two) times daily before a meal., Disp: 60 tablet, Rfl: 1 .  Calcium Polycarbophil (FIBER) 625 MG TABS, Take by mouth. (Patient not taking: Reported on 08/17/2019), Disp: , Rfl:    Family History  Problem Relation Age of Onset  . Heart attack Mother   . Heart attack Father      Social History   Tobacco Use  . Smoking status: Never Smoker  . Smokeless tobacco: Never Used  Vaping Use  . Vaping Use: Never used  Substance Use Topics  . Alcohol use: Never  . Drug use: Never    Allergies as of 10/16/2019 - Review Complete 10/16/2019  Allergen Reaction Noted  . Colesevelam Other (See Comments) 06/02/2018  . Lovastatin Other (See Comments) 06/02/2018  . Tetracyclines & related Rash 06/02/2018    Review of Systems:    All systems  reviewed and negative except where noted in HPI.   Physical Exam:  BP (!) 141/68 (BP Location: Left Arm, Patient Position: Sitting, Cuff Size: Normal)   Pulse 61   Temp 98.8 F (37.1 C) (Oral)   Ht 5\' 3"  (1.6 m)   Wt 114 lb (51.7 kg)   BMI 20.19 kg/m  No LMP recorded. Patient is postmenopausal.  General:   Alert, moderately built, moderately nourished, pleasant and cooperative in NAD Head:  Normocephalic and atraumatic. Eyes:  Sclera clear, no icterus.   Conjunctiva pink. Ears:  Normal auditory acuity. Lungs:  Respirations even and unlabored.  Clear throughout to auscultation.   No wheezes, crackles, or rhonchi. No acute distress. Heart:  Regular rate and rhythm; no murmurs, clicks, rubs, or gallops. Abdomen:  Normal bowel sounds. Soft, scaphoid, non-tender and non distended, tympanic without masses, hepatosplenomegaly or hernias noted.  No guarding or rebound tenderness.   Rectal: Not performed Msk:  Symmetrical without gross deformities. Good, equal movement & strength bilaterally. Pulses:  Normal pulses noted. Extremities:  No clubbing or edema.  No cyanosis. Neurologic:  Alert  and oriented x3;  grossly normal neurologically. Skin:  Intact without significant lesions or rashes. No jaundice. Psych:  Alert and cooperative. Normal mood and affect.  Imaging Studies: Reviewed  Assessment and Plan:   Allice PAULINA MUCHMORE is a 86 y.o. 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  Patient is also evaluated by general surgery for resection/revision of the gastrojejunal anastomotic stricture and she is reluctant to undergo surgery at this time.  Repeat upper endoscopy revealed tight turn at the gastrojejunal anastomosis rather than a stricture probably contributing to symptoms of partial gastric outlet obstruction No further intervention at this time other than dietary  modification  Continue Protonix 40 mg 2 times a day before meals, long-term Continue soft foods, discussed few options Continue stool softener MiraLAX daily to have regular bowel movements   Follow up in 6 months   Brittany Repress, MD

## 2019-10-17 ENCOUNTER — Encounter: Payer: Self-pay | Admitting: Gastroenterology

## 2019-12-29 ENCOUNTER — Other Ambulatory Visit: Payer: Self-pay | Admitting: Gastroenterology

## 2019-12-29 DIAGNOSIS — K9189 Other postprocedural complications and disorders of digestive system: Secondary | ICD-10-CM

## 2019-12-29 DIAGNOSIS — Z980 Intestinal bypass and anastomosis status: Secondary | ICD-10-CM

## 2020-03-21 IMAGING — CT CT ABDOMEN AND PELVIS WITH CONTRAST
2 of 5 series · 17 of 46 positions shown, 19 images · IV contrast (APPLIED)
Comparison: CT scan of May 29, 2018.

CLINICAL DATA: Acute generalized abdominal pain.

EXAM:
CT ABDOMEN AND PELVIS WITH CONTRAST
TECHNIQUE: Multidetector CT imaging of the abdomen and pelvis was performed
using the standard protocol following bolus administration of
intravenous contrast.
CONTRAST:  60mL OMNIPAQUE IOHEXOL 300 MG/ML  SOLN

[Series 2: routine abd/pel with (person_name) · axial · 0.71mm/px · z∈[-1053,-718]mm · 14 of 77 slices shown, 16 images]
[im 5/77  soft-tissue]
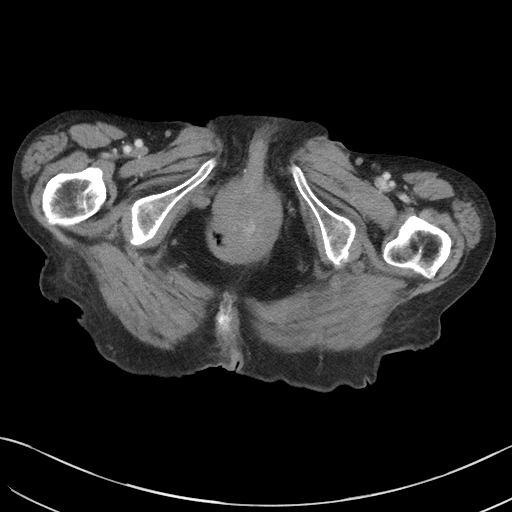
[im 5/77  bone]
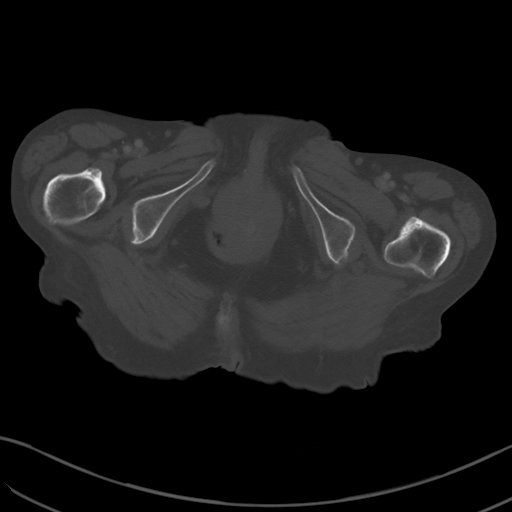
[im 9/77  soft-tissue]
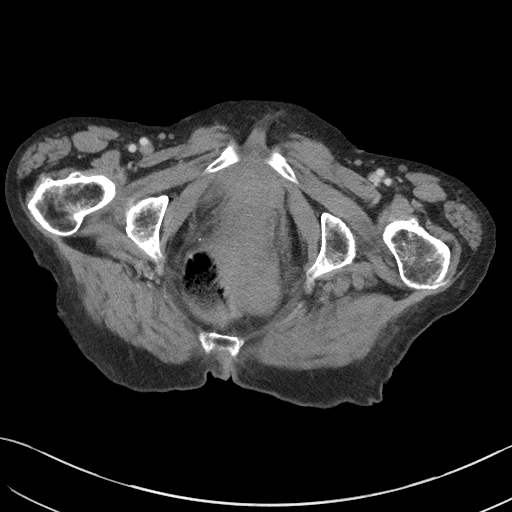
[im 17/77  soft-tissue]
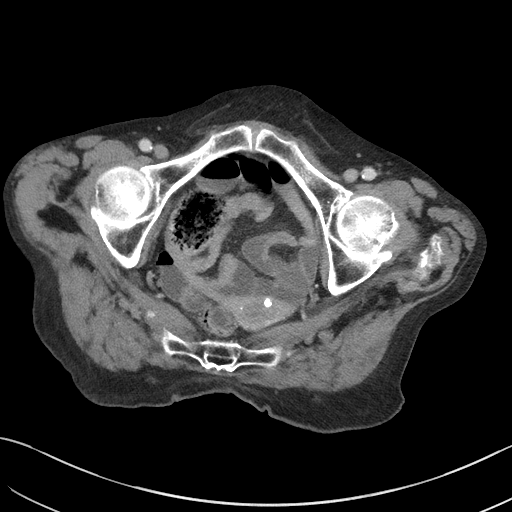
[im 22/77  soft-tissue]
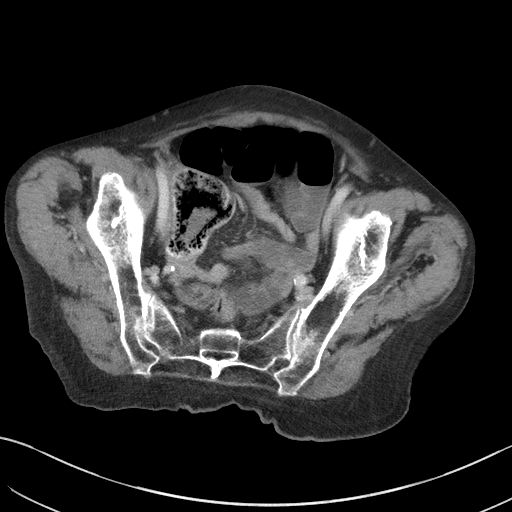
[im 26/77  soft-tissue]
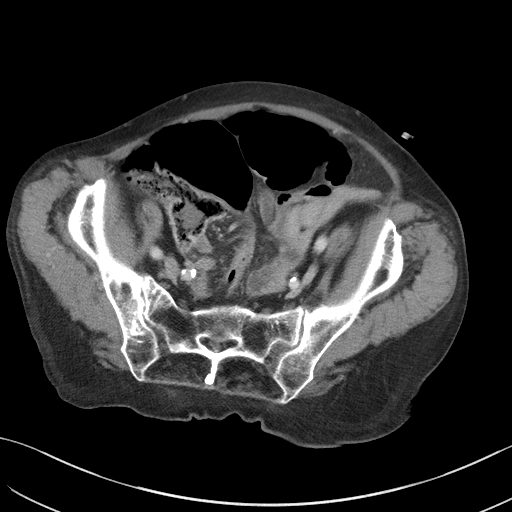
[im 30/77  soft-tissue]
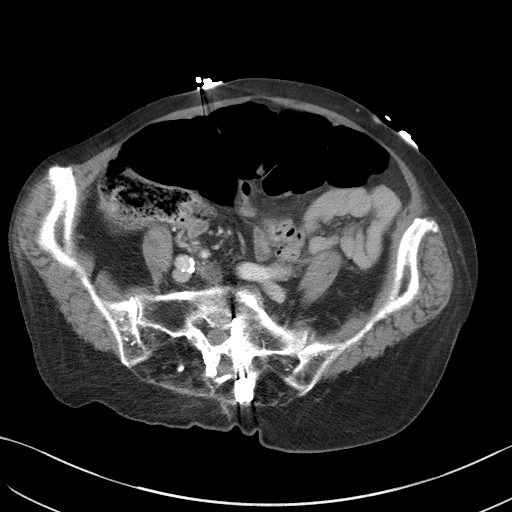
[im 34/77  soft-tissue]
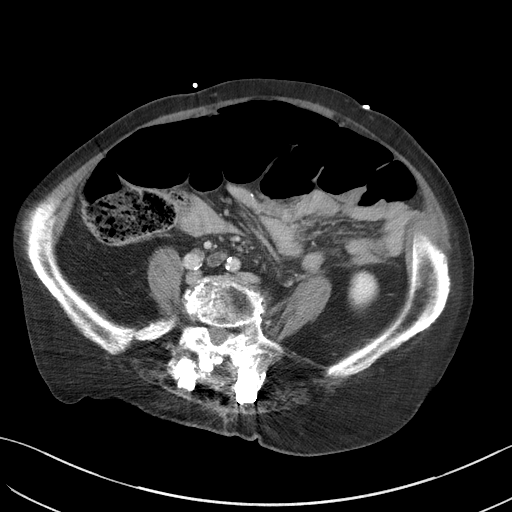
[im 43/77  soft-tissue]
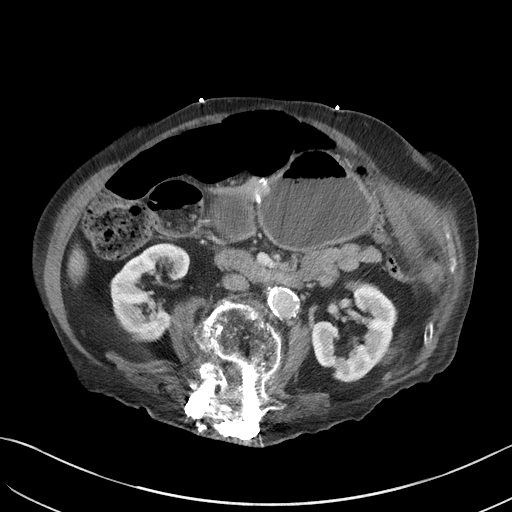
[im 47/77  soft-tissue]
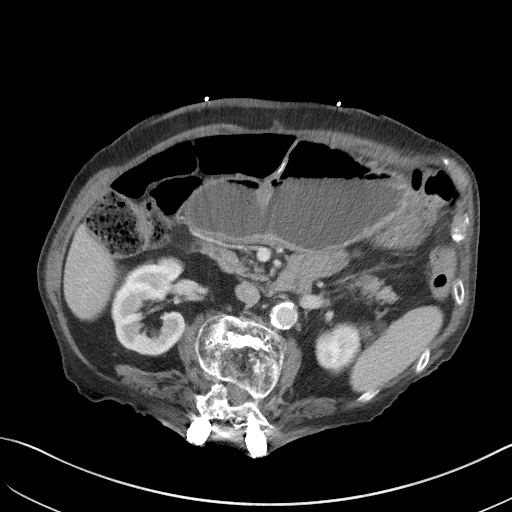
[im 47/77  bone]
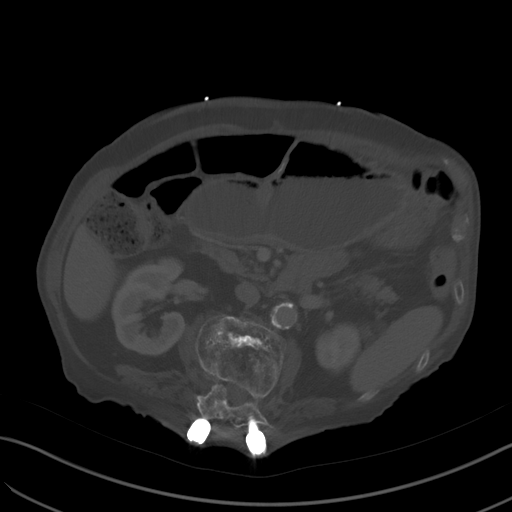
[im 51/77  soft-tissue]
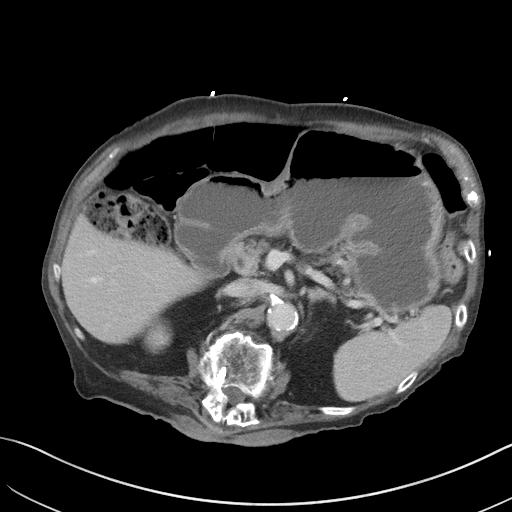
[im 55/77  soft-tissue]
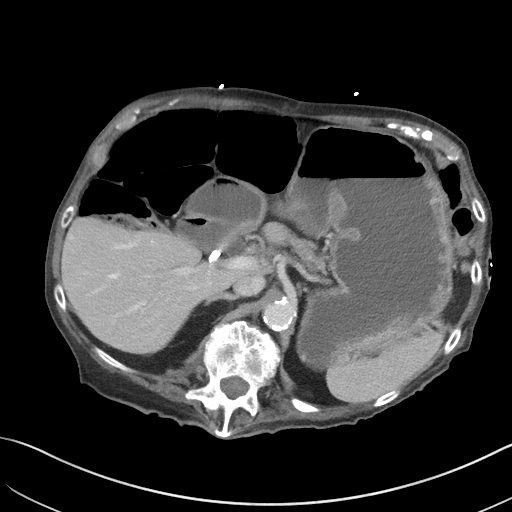
[im 60/77  soft-tissue]
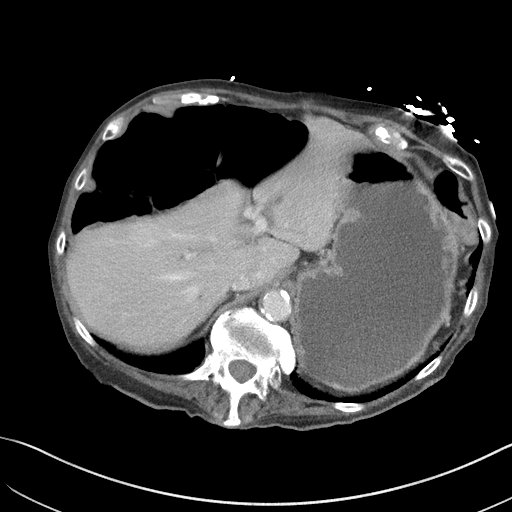
[im 68/77  soft-tissue]
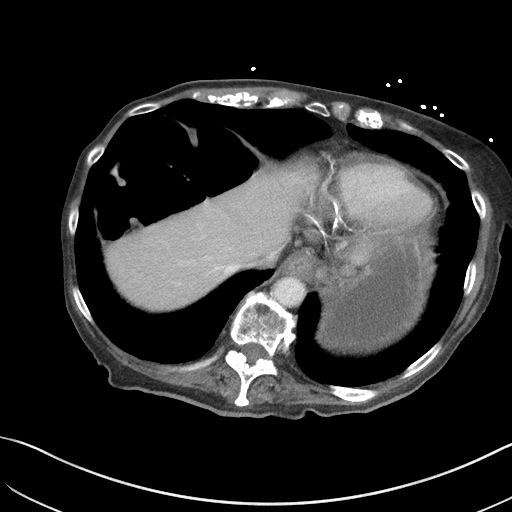
[im 72/77  soft-tissue]
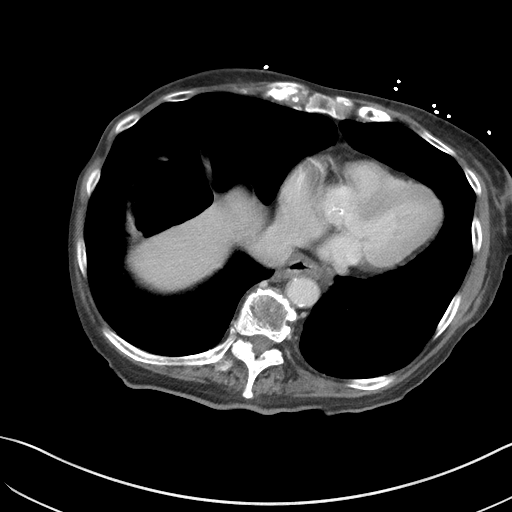

[Series 5: coronal st · coronal · 0.72mm/px · 3 of 86 slices shown]
[im 29/86  soft-tissue]
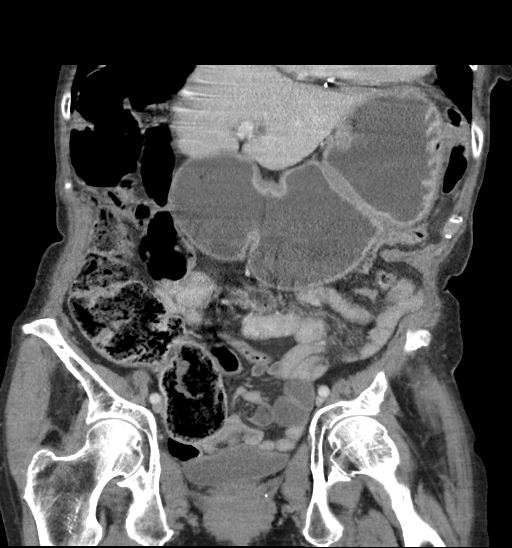
[im 38/86  soft-tissue]
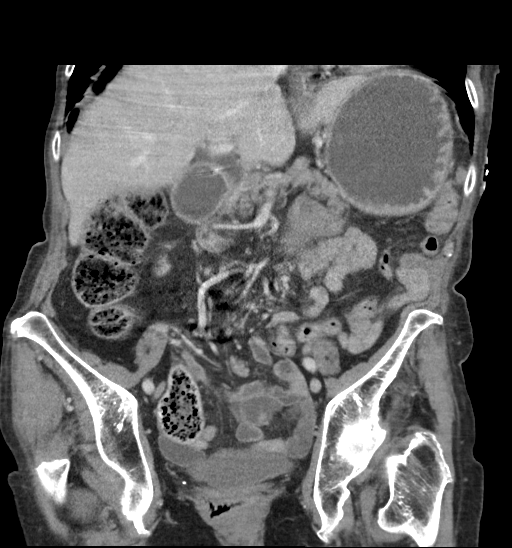
[im 48/86  soft-tissue]
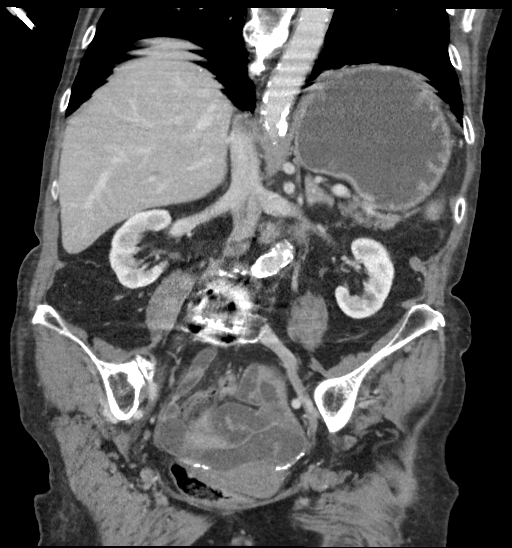

[17 of 46 positions shown; findings below may reference images not displayed]

FINDINGS: Lower chest: No acute abnormality.

Hepatobiliary: No focal liver abnormality is seen. Status post
cholecystectomy. No biliary dilatation.

Pancreas: Unremarkable. No pancreatic ductal dilatation or
surrounding inflammatory changes.

Spleen: Normal in size without focal abnormality.

Adrenals/Urinary Tract: Adrenal glands appear normal. Left renal
cyst is noted. No hydronephrosis or renal obstruction is noted. No
renal or ureteral calculi are noted. Urinary bladder is
unremarkable.

Stomach/Bowel: Mild gastric distention is noted. No definite
evidence of large or small bowel obstruction or inflammation is
noted. The appendix is not visualized.

Vascular/Lymphatic: Aortic atherosclerosis. No enlarged abdominal or
pelvic lymph nodes.

Reproductive: Uterus and bilateral adnexa are unremarkable.

Other: No abdominal wall hernia or abnormality. No abdominopelvic
ascites.

Musculoskeletal: No acute or significant osseous findings.
IMPRESSION: Mild gastric distention is noted.

No definite evidence of large or small bowel obstruction or
inflammation.

Aortic Atherosclerosis (V82ZV-L0C.C).

## 2020-03-22 IMAGING — DX ABDOMEN - 1 VIEW
1 series · 1 of 1 positions shown · non-contrast
Comparison: 06/02/2018 CT abdomen and pelvis.

CLINICAL DATA: 85 y/o  F; NG tube placement.

EXAM:
ABDOMEN - 1 VIEW

[abdomen supine]
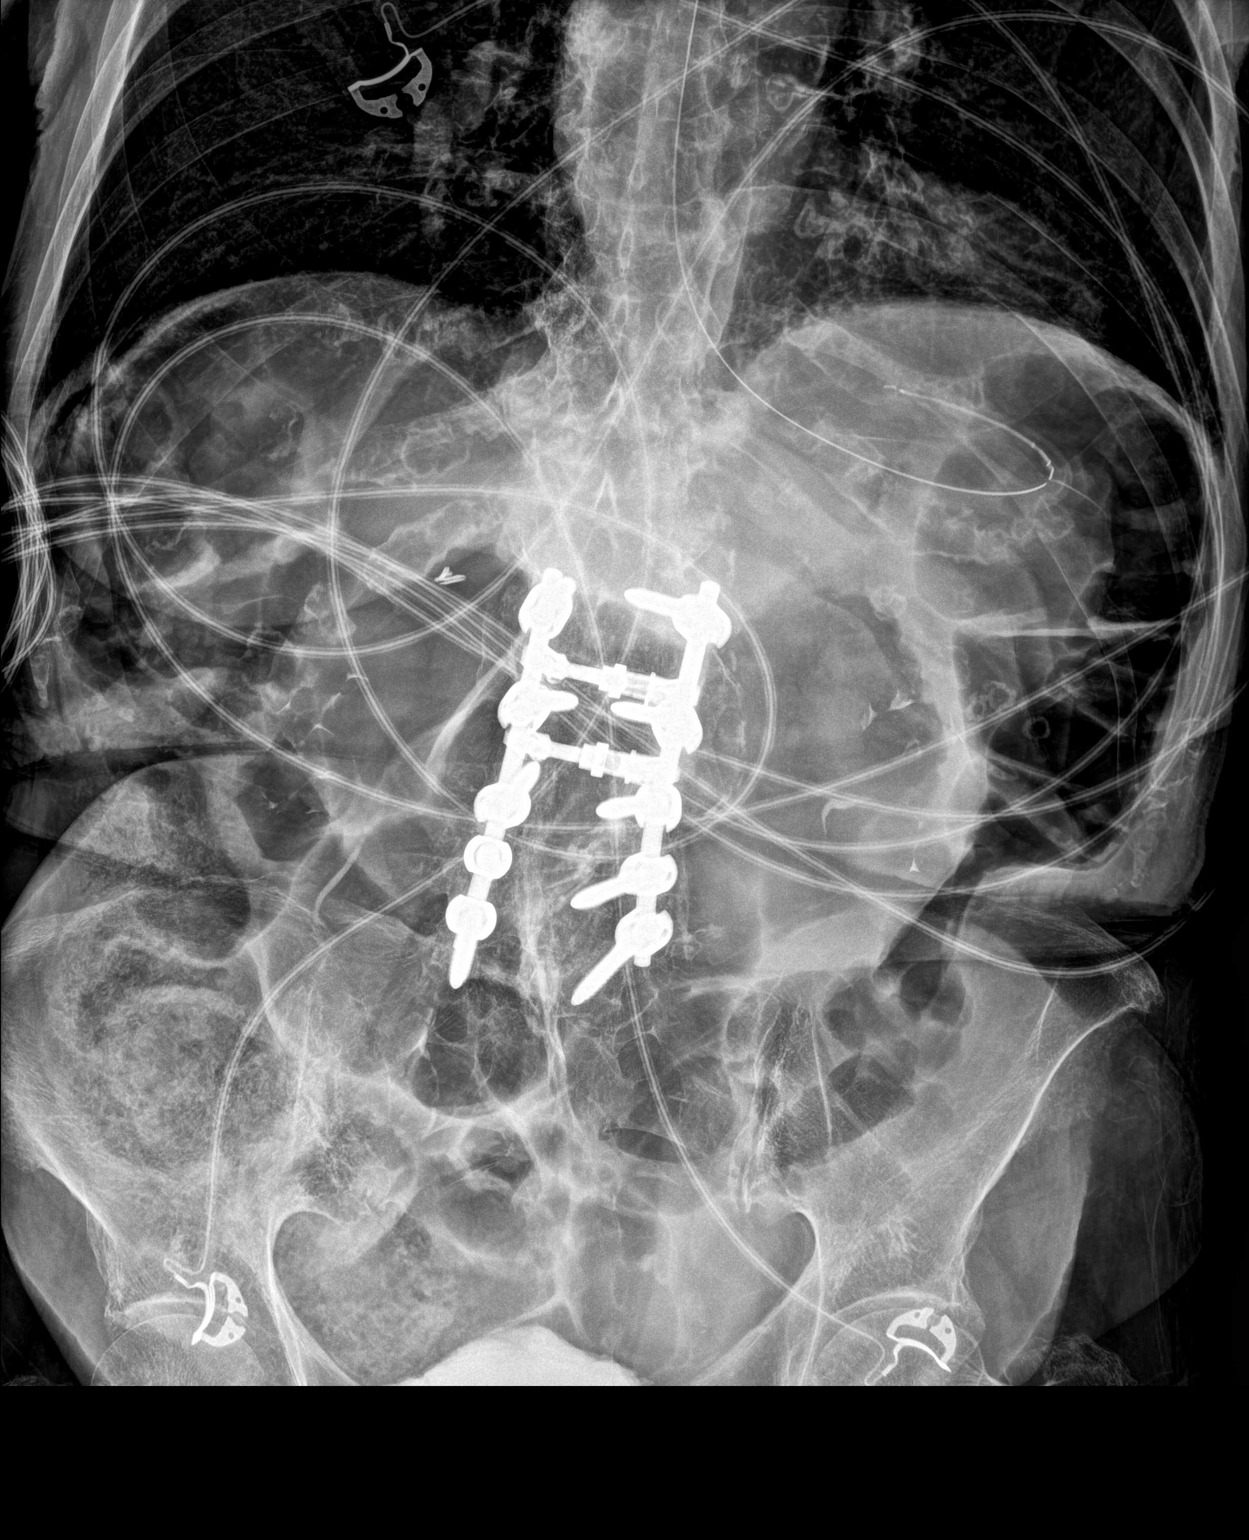

[1 of 1 positions shown; findings below may reference images not displayed]

FINDINGS: Enteric tube tip projects over the proximal stomach. Right upper
quadrant cholecystectomy clips. Lumbar fusion hardware noted. Bones
are demineralized. There is contrast accumulation within the
bladder. Nonobstructive bowel gas pattern.
IMPRESSION: Enteric tube tip projects over proximal stomach.

## 2020-04-08 ENCOUNTER — Ambulatory Visit: Payer: Medicare PPO | Admitting: Gastroenterology

## 2020-04-08 ENCOUNTER — Other Ambulatory Visit: Payer: Self-pay

## 2020-04-08 ENCOUNTER — Encounter: Payer: Self-pay | Admitting: Gastroenterology

## 2020-04-08 VITALS — BP 147/72 | HR 66 | Temp 98.0°F | Wt 118.0 lb

## 2020-04-08 DIAGNOSIS — Z980 Intestinal bypass and anastomosis status: Secondary | ICD-10-CM

## 2020-04-08 DIAGNOSIS — K311 Adult hypertrophic pyloric stenosis: Secondary | ICD-10-CM

## 2020-04-08 DIAGNOSIS — K9189 Other postprocedural complications and disorders of digestive system: Secondary | ICD-10-CM

## 2020-04-08 NOTE — Progress Notes (Signed)
[name], MD [address]  [address]  [place], [place] [address]  Main: [phone]  Fax: [phone]    Gastroenterology Consultation  Referring Provider:     Marguarite Arbour, MD Primary Care Physician:  Marguarite Arbour, MD Primary Gastroenterologist:  Dr. Maximino Greenland Reason for Consultation:   Gastrojejunal anastomotic stricture        HPI:   [name] is a 87 y.o. female referred by Dr. 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 Patient has history of gastrojejunostomy by Dr. Lemar Livings in 2011.  She subsequently developed anastomotic stricture that resulted in partial gastric outlet obstruction that required dilation in 06/05/2018 and most recently again when she was admitted to [facility] in 02/2019 to 76mm.  Patient recovered well and transition to soft food, tolerated well.  She also has chronic constipation Patient has been evaluated by Dr. Everlene Farrier and previously by Dr. Aleen Campi and patient and her daughter are reluctant to undergo surgical resection given her age.  Interval summary Patient's weight is 112 pounds which has been stable and slightly improved.  Patient is tolerating soft diet, well managed by her daughter.  She does have irregular bowel habits for which she takes fiber supplements.  Patient denies abdominal pain, nausea, vomiting, bloating.  She is working with physical therapy and able to ambulate to a limited extent Patient is currently taking Protonix 40 mg 2 times a day  Follow-up visit 06/12/2019 [name] is accompanied by her daughter today.  She underwent upper endoscopy in 03/2019 which revealed Billroth II gastrojejunostomy with no evidence of ulceration.  She did have a kink/tight turn at the anastomosis causing symptoms of gastric outlet obstruction.  Patient has been on acid suppressive therapy, strictly following mechanical soft/ pured diet and has been  tolerating it very well.  She reports also having regular bowel movements.  She gained 3 pounds since last visit, current weight is 115 LBS. Most recent labs including CBC and CMP are unremarkable, hemoglobin A1c is mildly elevated, TSH normal  Follow-up visit 10/16/2019 Patient was hospitalized secondary to UTI in 6/21.  She is currently doing well from GI standpoint.  She is not only on pured diet but also eating mashed foods and tolerating well.  Her weight has been stable.  She does have regular bowel movements, taking MiraLAX once daily.  She continues to take Protonix 40 mg twice daily.  Patient is accompanied by her daughter today  Follow-up visit 04/08/2020 Patient reports that for last 2 weeks, she was experiencing abdominal bloating associated with watery bowel movements and sometimes blood on wiping.  She was told to have banana and Gatorade because of her mildly low potassium by her PCP.  She thinks that the Powerade may have caused her GI symptoms and she stopped taking it last week.  Today, she had a soft bowel movement.  She also has been taking MiraLAX 2 times a day as suggested by her PCP.  Patient gained about 4 pounds since last visit.  She is compliant with her medications and adherent to pured/mechanical soft diet only.  Patient is accompanied by her daughter today  NSAIDs: None  Antiplts/Anticoagulants/Anti thrombotics: None  GI Procedures:  EGD 02/28/2019 EGD 06/05/2018 EGD 06/04/2018 Upper endoscopy 04/08/2019 - Stenosed Billroth II gastrojejunostomy was found, characterized by friable mucosa. - A medium amount of food (residue) in the stomach. - Normal stomach. Biopsied. - LA Grade A reflux  esophagitis with no bleeding. - Esophagogastric landmarks identified.   Past Medical History:  Diagnosis Date  . Acute esophagitis   . E. coli sepsis (HCC) 08/18/2019  . GERD (gastroesophageal reflux disease)   . Hemorrhoids 06/18/2018  . High cholesterol   . Hypertension   .  Ileus (HCC) 06/02/2018  . Long term prescription opiate use 08/22/2017  . Uses walker   . Vertigo    long ago.  no current issues  . Weakness of left foot   . Wears hearing aid in both ears     Past Surgical History:  Procedure Laterality Date  . ABDOMINAL SURGERY    . BACK SURGERY    . BIOPSY N/A 04/08/2019   Procedure: BIOPSY;  Surgeon: Toney Reil, MD;  Location: [facility];  Service: Endoscopy;  Laterality: N/A;  . CHOLECYSTECTOMY    . ESOPHAGOGASTRODUODENOSCOPY N/A 06/04/2018   Procedure: ESOPHAGOGASTRODUODENOSCOPY (EGD);  Surgeon: Pasty Spillers, MD;  Location: [facility] ENDOSCOPY;  Service: Endoscopy;  Laterality: N/A;  . ESOPHAGOGASTRODUODENOSCOPY N/A 06/05/2018   Procedure: ESOPHAGOGASTRODUODENOSCOPY (EGD);  Surgeon: Midge Minium, MD;  Location: [facility] ENDOSCOPY;  Service: Endoscopy;  Laterality: N/A;  use Fluoro bed, X-ray is aware  . ESOPHAGOGASTRODUODENOSCOPY N/A 02/28/2019   Procedure: ESOPHAGOGASTRODUODENOSCOPY (EGD);  Surgeon: Pasty Spillers, MD;  Location: [facility] ENDOSCOPY;  Service: Endoscopy;  Laterality: N/A;  . ESOPHAGOGASTRODUODENOSCOPY (EGD) WITH PROPOFOL N/A 04/08/2019   Procedure: ESOPHAGOGASTRODUODENOSCOPY (EGD) WITH PROPOFOL;  Surgeon: Toney Reil, MD;  Location: [facility];  Service: Endoscopy;  Laterality: N/A;  priority 4  . JOINT REPLACEMENT    . REPLACEMENT TOTAL KNEE Right   . TOE AMPUTATION Left    great toe  . TONSILLECTOMY      Current Outpatient Medications:  .  atorvastatin (LIPITOR) 20 MG tablet, Take 20 mg by mouth at bedtime. , Disp: , Rfl:  .  Cyanocobalamin (VITAMIN B-12 PO), Take 1 tablet by mouth daily. , Disp: , Rfl:  .  Dimethicone-Zinc Oxide-Vit A-D (A & D ZINC OXIDE) CREA, Apply topically., Disp: , Rfl:  .  fentaNYL (DURAGESIC) 100 MCG/HR, Place 1 patch onto the skin every 3 (three) days., Disp: , Rfl:  .  folic acid (FOLVITE) 1 MG tablet, Take 1 mg by mouth daily. , Disp: , Rfl:  .  pantoprazole  (PROTONIX) 40 MG tablet, TAKE 1 TABLET BY MOUTH TWICE DAILY, Disp: 180 tablet, Rfl: 0   Family History  Problem Relation Age of Onset  . Heart attack Mother   . Heart attack Father      Social History   Tobacco Use  . Smoking status: Never Smoker  . Smokeless tobacco: Never Used  Vaping Use  . Vaping Use: Never used  Substance Use Topics  . Alcohol use: Never  . Drug use: Never    Allergies as of 04/08/2020 - Review Complete 04/08/2020  Allergen Reaction Noted  . Colesevelam Other (See Comments) 06/02/2018  . Lovastatin Other (See Comments) 06/02/2018  . Tetracyclines & related Rash 06/02/2018    Review of Systems:    All systems reviewed and negative except where noted in HPI.   Physical Exam:  BP (!) 147/72 (BP Location: Left Arm, Patient Position: Sitting, Cuff Size: Normal)   Pulse 66   Temp 98 F (36.7 C) (Oral)   Wt 118 lb (53.5 kg)   BMI 20.90 kg/m  No LMP recorded. Patient is postmenopausal.  General:   Alert, moderately built, moderately nourished, pleasant and cooperative in NAD Head:  Normocephalic and atraumatic. Eyes:  Sclera clear, no icterus.   Conjunctiva pink. Ears:  Normal auditory acuity. Lungs:  Respirations even and unlabored.  Clear throughout to auscultation.   No wheezes, crackles, or rhonchi. No acute distress. Heart:  Regular rate and rhythm; no murmurs, clicks, rubs, or gallops. Abdomen:  Normal bowel sounds. Soft, scaphoid, non-tender and non distended, tympanic without masses, hepatosplenomegaly or hernias noted.  No guarding or rebound tenderness.   Rectal: Not performed Msk:  Symmetrical without gross deformities. Good, equal movement & strength bilaterally. Pulses:  Normal pulses noted. Extremities:  No clubbing or edema.  No cyanosis. Neurologic:  Alert and oriented x3;  grossly normal neurologically. Skin:  Intact without significant lesions or rashes. No jaundice. Psych:  Alert and cooperative. Normal mood and affect.  Imaging  Studies: Reviewed  Assessment and Plan:   Shavonte INDIA JOLIN is a 87 y.o. 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  Patient is also evaluated by general surgery for resection/revision of the gastrojejunal anastomotic stricture and she is reluctant to undergo surgery at this time.  Repeat upper endoscopy revealed tight turn at the gastrojejunal anastomosis rather than a stricture probably contributing to symptoms of partial gastric outlet obstruction No further intervention at this time other than dietary modification  Continue Protonix 40 mg 2 times a day before meals, long-term Continue soft foods, discussed few options Continue stool softener MiraLAX daily or as needed to have regular bowel movements   Follow up in 6 months   [name], MD

## 2020-06-26 ENCOUNTER — Emergency Department: Payer: Medicare PPO

## 2020-06-26 ENCOUNTER — Emergency Department
Admission: EM | Admit: 2020-06-26 | Discharge: 2020-06-26 | Disposition: A | Discharge status: Home / Self Care | Payer: Medicare PPO | Attending: Emergency Medicine | Admitting: Emergency Medicine

## 2020-06-26 ENCOUNTER — Other Ambulatory Visit: Payer: Self-pay

## 2020-06-26 DIAGNOSIS — R059 Cough, unspecified: Secondary | ICD-10-CM | POA: Diagnosis not present

## 2020-06-26 DIAGNOSIS — R531 Weakness: Secondary | ICD-10-CM | POA: Insufficient documentation

## 2020-06-26 DIAGNOSIS — R110 Nausea: Secondary | ICD-10-CM | POA: Insufficient documentation

## 2020-06-26 DIAGNOSIS — Z5321 Procedure and treatment not carried out due to patient leaving prior to being seen by health care provider: Secondary | ICD-10-CM | POA: Diagnosis not present

## 2020-06-26 NOTE — ED Triage Notes (Signed)
Pt states that she was seen over at [facility] for a cold and she got nauseous- pt was given a pill for the nausea and sent over here for a chest xray

## 2020-06-26 NOTE — ED Notes (Signed)
Pt brought from [facility] for nausea and weakness. Pt's sister is sick. KC MD called to inform that lungs sound bad and pt may need admission.

## 2020-06-26 NOTE — ED Notes (Signed)
Daughter informed this RN and registration that they would be taking her home. Visualized leaving hospital.

## 2020-06-26 NOTE — ED Notes (Signed)
Pt has a 100mg  fentanyl patch on her R breast

## 2020-10-05 ENCOUNTER — Other Ambulatory Visit: Payer: Self-pay

## 2020-10-08 ENCOUNTER — Ambulatory Visit: Payer: Medicare PPO | Admitting: Gastroenterology

## 2020-10-08 ENCOUNTER — Encounter: Payer: Self-pay | Admitting: Gastroenterology

## 2020-10-08 ENCOUNTER — Other Ambulatory Visit: Payer: Self-pay

## 2020-10-08 VITALS — BP 161/69 | HR 62 | Temp 97.8°F | Ht 63.0 in | Wt 112.2 lb

## 2020-10-08 DIAGNOSIS — K9189 Other postprocedural complications and disorders of digestive system: Secondary | ICD-10-CM

## 2020-10-08 NOTE — Progress Notes (Signed)
[name], MD [address]  [address]  [place], [place] [address]  Main: [phone]  Fax: [phone]    Gastroenterology Consultation  Referring Provider:     Marguarite Arbour, MD Primary Care Physician:  Marguarite Arbour, MD Primary Gastroenterologist:  Dr. Maximino Greenland Reason for Consultation:   Gastrojejunal anastomotic stricture        HPI:   [name] is a 87 y.o. female referred by Dr. 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 Patient has history of gastrojejunostomy by Dr. Lemar Livings in 2011.  She subsequently developed anastomotic stricture that resulted in partial gastric outlet obstruction that required dilation in 06/05/2018 and most recently again when she was admitted to [facility] in 02/2019 to 26mm.  Patient recovered well and transition to soft food, tolerated well.  She also has chronic constipation Patient has been evaluated by Dr. Everlene Farrier and previously by Dr. Aleen Campi and patient and her daughter are reluctant to undergo surgical resection given her age.  Interval summary Patient's weight is 112 pounds which has been stable and slightly improved.  Patient is tolerating soft diet, well managed by her daughter.  She does have irregular bowel habits for which she takes fiber supplements.  Patient denies abdominal pain, nausea, vomiting, bloating.  She is working with physical therapy and able to ambulate to a limited extent Patient is currently taking Protonix 40 mg 2 times a day  Follow-up visit 06/12/2019 [name] is accompanied by her daughter today.  She underwent upper endoscopy in 03/2019 which revealed Billroth II gastrojejunostomy with no evidence of ulceration.  She did have a kink/tight turn at the anastomosis causing symptoms of gastric outlet obstruction.  Patient has been on acid suppressive therapy, strictly following mechanical soft/ pured diet and has been  tolerating it very well.  She reports also having regular bowel movements.  She gained 3 pounds since last visit, current weight is 115 LBS. Most recent labs including CBC and CMP are unremarkable, hemoglobin A1c is mildly elevated, TSH normal  Follow-up visit 10/16/2019 Patient was hospitalized secondary to UTI in 6/21.  She is currently doing well from GI standpoint.  She is not only on pured diet but also eating mashed foods and tolerating well.  Her weight has been stable.  She does have regular bowel movements, taking MiraLAX once daily.  She continues to take Protonix 40 mg twice daily.  Patient is accompanied by her daughter today  Follow-up visit 04/08/2020 Patient reports that for last 2 weeks, she was experiencing abdominal bloating associated with watery bowel movements and sometimes blood on wiping.  She was told to have banana and Gatorade because of her mildly low potassium by her PCP.  She thinks that the Powerade may have caused her GI symptoms and she stopped taking it last week.  Today, she had a soft bowel movement.  She also has been taking MiraLAX 2 times a day as suggested by her PCP.  Patient gained about 4 pounds since last visit.  She is compliant with her medications and adherent to pured/mechanical soft diet only.  Patient is accompanied by her daughter today  Follow-up visit 10/08/2020 Patient reports that she has been doing well.  She lost about 6 pounds since last visit since cutting back on protein drinks.  Otherwise, reports good appetite, denies any nausea, vomiting, abdominal bloating, pain.  Her CBC and CMP are normal.  PSA  NSAIDs: None  Antiplts/Anticoagulants/Anti thrombotics: None  GI Procedures:  EGD 02/28/2019 EGD 06/05/2018 EGD 06/04/2018 Upper endoscopy 04/08/2019 - Stenosed Billroth II gastrojejunostomy was found, characterized by friable mucosa. - A medium amount of food (residue) in the stomach. - Normal stomach. Biopsied. - LA Grade A reflux  esophagitis with no bleeding. - Esophagogastric landmarks identified.   Past Medical History:  Diagnosis Date   Acute esophagitis    E. coli sepsis (HCC) 08/18/2019   GERD (gastroesophageal reflux disease)    Hemorrhoids 06/18/2018   High cholesterol    Hypertension    Ileus (HCC) 06/02/2018   Long term prescription opiate use 08/22/2017   Uses walker    Vertigo    long ago.  no current issues   Weakness of left foot    Wears hearing aid in both ears     Past Surgical History:  Procedure Laterality Date   ABDOMINAL SURGERY     BACK SURGERY     BIOPSY N/A 04/08/2019   Procedure: BIOPSY;  Surgeon: Toney Reil, MD;  Location: [facility];  Service: Endoscopy;  Laterality: N/A;   CHOLECYSTECTOMY     ESOPHAGOGASTRODUODENOSCOPY N/A 06/04/2018   Procedure: ESOPHAGOGASTRODUODENOSCOPY (EGD);  Surgeon: Pasty Spillers, MD;  Location: [facility];  Service: Endoscopy;  Laterality: N/A;   ESOPHAGOGASTRODUODENOSCOPY N/A 06/05/2018   Procedure: ESOPHAGOGASTRODUODENOSCOPY (EGD);  Surgeon: Midge Minium, MD;  Location: [facility] END[facility];  Service: Endoscopy;  Laterality: N/A;  use Fluoro bed, X-ray is aware   ESOPHAGOGASTRODUODENOSCOPY N/A 02/28/2019   Procedure: ESOPHAGOGASTRODUODENOSCOPY (EGD);  Surgeon: Pasty Spillers, MD;  Location: [facility];  Service: Endoscopy;  Laterality: N/A;   ESOPHAGOGASTRODUODENOSCOPY (EGD) WITH PROPOFOL N/A 04/08/2019   Procedure: ESOPHAGOGASTRODUODENOSCOPY (EGD) WITH PROPOFOL;  Surgeon: Toney Reil, MD;  Location: [facility];  Service: Endoscopy;  Laterality: N/A;  priority 4   JOINT REPLACEMENT     REPLACEMENT TOTAL KNEE Right    TOE AMPUTATION Left    great toe   TONSILLECTOMY      Current Outpatient Medications:    atorvastatin (LIPITOR) 20 MG tablet, Take by mouth., Disp: , Rfl:    Cyanocobalamin (VITAMIN B-12 PO), Take 1 tablet by mouth daily. , Disp: , Rfl:    Dimethicone-Zinc Oxide-Vit A-D (A & D ZINC OXIDE)  CREA, Apply topically., Disp: , Rfl:    fentaNYL (DURAGESIC) 100 MCG/HR, Place onto the skin., Disp: , Rfl:    folic acid (FOLVITE) 1 MG tablet, Take 1 mg by mouth daily. , Disp: , Rfl:    pantoprazole (PROTONIX) 40 MG tablet, Take 1 tablet by mouth 2 (two) times daily., Disp: , Rfl:    Family History  Problem Relation Age of Onset   Heart attack Mother    Heart attack Father      Social History   Tobacco Use   Smoking status: Never   Smokeless tobacco: Never  Vaping Use   Vaping Use: Never used  Substance Use Topics   Alcohol use: Never   Drug use: Never    Allergies as of 10/08/2020 - Review Complete 10/08/2020  Allergen Reaction Noted   Colesevelam Other (See Comments) 06/02/2018   Lovastatin Other (See Comments) 06/02/2018   Tetracyclines & related Rash 06/02/2018    Review of Systems:    All systems reviewed and negative except where noted in HPI.   Physical Exam:  BP (!) 161/69 (BP Location: Right Arm, Patient Position: Sitting)   Pulse 62   Temp 97.8 F (36.6 C) (Oral)  Ht 5\' 3"  (1.6 m)   Wt 112 lb 3.2 oz (50.9 kg)   BMI 19.88 kg/m  No LMP recorded. Patient is postmenopausal.  General:   Alert, moderately built, moderately nourished, pleasant and cooperative in NAD Head:  Normocephalic and atraumatic. Eyes:  Sclera clear, no icterus.   Conjunctiva pink. Ears:  Normal auditory acuity. Lungs:  Respirations even and unlabored.  Clear throughout to auscultation.   No wheezes, crackles, or rhonchi. No acute distress. Heart:  Regular rate and rhythm; no murmurs, clicks, rubs, or gallops. Abdomen:  Normal bowel sounds. Soft, scaphoid, non-tender and non distended, tympanic without masses, hepatosplenomegaly or hernias noted.  No guarding or rebound tenderness.   Rectal: Not performed Msk:  Symmetrical without gross deformities. Good, equal movement & strength bilaterally. Pulses:  Normal pulses noted. Extremities:  No clubbing or edema.  No  cyanosis. Neurologic:  Alert and oriented x3;  grossly normal neurologically. Skin:  Intact without significant lesions or rashes. No jaundice. Psych:  Alert and cooperative. Normal mood and affect.  Imaging Studies: Reviewed  Assessment and Plan:   [name] is a 87 y.o. 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  Patient is also evaluated by general surgery for resection/revision of the gastrojejunal anastomotic stricture and she is reluctant to undergo surgery at this time.  Repeat upper endoscopy revealed tight turn at the gastrojejunal anastomosis rather than a stricture probably contributing to symptoms of partial gastric outlet obstruction No further intervention at this time other than dietary modification  Continue Protonix 40 mg 2 times a day before meals, long-term Continue soft foods, discussed few options Continue stool softener MiraLAX daily or as needed to have regular bowel movements   Follow up in 6 months   2012, MD

## 2020-11-25 ENCOUNTER — Other Ambulatory Visit: Payer: Self-pay

## 2020-11-25 ENCOUNTER — Ambulatory Visit: Payer: Medicare PPO | Admitting: Dermatology

## 2020-11-25 DIAGNOSIS — L578 Other skin changes due to chronic exposure to nonionizing radiation: Secondary | ICD-10-CM

## 2020-11-25 DIAGNOSIS — L720 Epidermal cyst: Secondary | ICD-10-CM

## 2020-11-25 DIAGNOSIS — L821 Other seborrheic keratosis: Secondary | ICD-10-CM

## 2020-11-25 DIAGNOSIS — L089 Local infection of the skin and subcutaneous tissue, unspecified: Secondary | ICD-10-CM

## 2020-11-25 DIAGNOSIS — L570 Actinic keratosis: Secondary | ICD-10-CM

## 2020-11-25 MED ORDER — MUPIROCIN 2 % EX OINT: 1.0000 "application " | TOPICAL_OINTMENT | Freq: Every day | CUTANEOUS | 0 refills | Status: DC

## 2020-11-25 MED ORDER — CEPHALEXIN 500 MG PO CAPS: 500.0000 mg | ORAL_CAPSULE | Freq: Three times a day (TID) | ORAL | 0 refills | Status: AC

## 2020-11-25 NOTE — Progress Notes (Signed)
New Patient Visit  Subjective  [name] is a 87 y.o. female who presents for the following: Skin Problem (Patient here today for a spot at abdomen, at right upper arm and to have face checked. Area at abdomen has recently gotten sore and red. The spot at abdomen and at right upper arm has been checked in the past and patient was told they were ok. No hx of skin cancer. ).  Patient accompanied by her sister.   The following portions of the chart were reviewed this encounter and updated as appropriate:   Tobacco  Allergies  Meds  Problems  Med Hx  Surg Hx  Fam Hx      Review of Systems:  No other skin or systemic complaints except as noted in HPI or Assessment and Plan.  Objective  Well appearing patient in no apparent distress; mood and affect are within normal limits.  A focused examination was performed including face, arms, abdomen. Relevant physical exam findings are noted in the Assessment and Plan.  Left lower abdomen Subcutaneous nodule.   Left Inguinal Area    Assessment & Plan  Epidermal inclusion cyst Left lower abdomen  Inflamed and symptomatic  mupirocin ointment (BACTROBAN) 2 % - Left lower abdomen Apply 1 application topically daily.  cephALEXin (KEFLEX) 500 MG capsule - Left lower abdomen Take 1 capsule (500 mg total) by mouth 3 (three) times daily for 7 days.  Incision and Drainage - Left lower abdomen Location: left lower abdomen  Informed Consent: Discussed risks (permanent scarring, light or dark discoloration, infection, pain, bleeding, bruising, redness, damage to adjacent structures, and recurrence of the lesion) and benefits of the procedure, as well as the alternatives.  Informed consent was obtained.  Preparation: The area was prepped with alcohol.  Anesthesia: Lidocaine 1% with epinephrine  Procedure Details: An incision was made overlying the lesion. The lesion drained pus, white, chalky cyst material, and blood.  A large  amount of fluid was drained.    Antibiotic ointment and a sterile pressure dressing were applied. The patient tolerated procedure well.  Total number of lesions drained: 1  Plan: The patient was instructed on post-op care. Recommend OTC analgesia as needed for pain.   Related Procedures Anaerobic and Aerobic Culture  Local infection of skin and subcutaneous tissue Left Inguinal Area  Cyst suspicious for infection - will I&D  Start mupirocin to affected area 3 times daily  Will send in cephalexin 500 mg to use tid x 7 days if not improving. Patient will only start if not improving within 24-48 hours after I&D or if she experiences worsening.    Seborrheic Keratoses - Stuck-on, waxy, tan-brown papules and/or plaques  - Benign-appearing - Discussed benign etiology and prognosis. - Observe - Call for any changes  Actinic Damage - Severe, confluent actinic changes with pre-cancerous actinic keratoses  - Severe, chronic, not at goal, secondary to cumulative UV radiation exposure over time - diffuse scaly erythematous macules and papules with underlying dyspigmentation - Discussed Prescription "Field Treatment" for Severe, Chronic Confluent Actinic Changes with Pre-Cancerous Actinic Keratoses Field treatment involves treatment of an entire area of skin that has confluent Actinic Changes (Sun/ Ultraviolet light damage) and PreCancerous Actinic Keratoses by method of PhotoDynamic Therapy (PDT) and/or prescription Topical Chemotherapy agents such as 5-fluorouracil, 5-fluorouracil/calcipotriene, and/or imiquimod.  The purpose is to decrease the number of clinically evident and subclinical PreCancerous lesions to prevent progression to development of skin cancer by chemically destroying early precancer changes that  may or may not be visible.  It has been shown to reduce the risk of developing skin cancer in the treated area. As a result of treatment, redness, scaling, crusting, and open sores  may occur during treatment course. One or more than one of these methods may be used and may have to be used several times to control, suppress and eliminate the PreCancerous changes. Discussed treatment course, expected reaction, and possible side effects. - Recommend daily broad spectrum sunscreen SPF 30+ to sun-exposed areas, reapply every 2 hours as needed.  - Staying in the shade or wearing long sleeves, sun glasses (UVA+UVB protection) and wide brim hats (4-inch brim around the entire circumference of the hat) are also recommended. - Call for new or changing lesions. - Plan PDT to face  Return for PDT to face, 6-12 weeks with Dr. Neale Burly.  Anise Salvo, RMA, am acting as scribe for Darden Dates, MD .  Documentation: I have reviewed the above documentation for accuracy and completeness, and I agree with the above.  Darden Dates, MD

## 2020-11-25 NOTE — Patient Instructions (Addendum)
Start mupirocin to affected area daily. Will send in cephalexin 500 mg three times daily x 7 days if not improving. Patient will only start if gets worse.  If you have any questions or concerns for your doctor, please call our main line at [phone] and press option 4 to reach your doctor's medical assistant. If no one answers, please leave a voicemail as directed and we will return your call as soon as possible. Messages left after 4 pm will be answered the following business day.   You may also send [place] a message via MyChart. We typically respond to MyChart messages within 1-2 business days.  For prescription refills, please ask your pharmacy to contact our office. Our fax number is [phone].  If you have an urgent issue when the clinic is closed that cannot wait until the next business day, you can page your doctor at the number below.    Please note that while we do our best to be available for urgent issues outside of office hours, we are not available 24/7.   If you have an urgent issue and are unable to reach [place], you may choose to seek medical care at your doctor's office, retail clinic, urgent care center, or emergency room.  If you have a medical emergency, please immediately call 911 or go to the emergency department.  Pager Numbers  - Dr. Gwen Pounds: [phone]  - Dr. Neale Burly: [phone]  - Dr. Roseanne Reno: [phone]  In the event of inclement weather, please call our main line at [phone] for an update on the status of any delays or closures.  Dermatology Medication Tips: Please keep the boxes that topical medications come in in order to help keep track of the instructions about where and how to use these. Pharmacies typically print the medication instructions only on the boxes and not directly on the medication tubes.   If your medication is too expensive, please contact our office at [phone] option 4 or send [place] a message through MyChart.   We are unable to  tell what your co-pay for medications will be in advance as this is different depending on your insurance coverage. However, we may be able to find a substitute medication at lower cost or fill out paperwork to get insurance to cover a needed medication.   If a prior authorization is required to get your medication covered by your insurance company, please allow [place] 1-2 business days to complete this process.  Drug prices often vary depending on where the prescription is filled and some pharmacies may offer cheaper prices.  The website www.goodrx.com contains coupons for medications through different pharmacies. The prices here do not account for what the cost may be with help from insurance (it may be cheaper with your insurance), but the website can give you the price if you did not use any insurance.  - You can print the associated coupon and take it with your prescription to the pharmacy.  - You may also stop by our office during regular business hours and pick up a GoodRx coupon card.  - If you need your prescription sent electronically to a different pharmacy, notify our office through [facility] or by phone at [phone] option 4.

## 2020-11-25 NOTE — Progress Notes (Signed)
entered in error   

## 2020-11-30 ENCOUNTER — Other Ambulatory Visit: Payer: Self-pay

## 2020-11-30 ENCOUNTER — Ambulatory Visit: Payer: Medicare PPO

## 2020-11-30 DIAGNOSIS — L570 Actinic keratosis: Secondary | ICD-10-CM

## 2020-11-30 MED ORDER — AMINOLEVULINIC ACID HCL 20 % EX SOLR
1.0000 "application " | Freq: Once | CUTANEOUS | Status: AC
  Administered 2020-11-30: 354 mg via TOPICAL

## 2020-11-30 NOTE — Progress Notes (Signed)

## 2020-11-30 NOTE — Patient Instructions (Signed)

## 2020-12-06 ENCOUNTER — Telehealth: Payer: Self-pay

## 2020-12-06 LAB — ANAEROBIC AND AEROBIC CULTURE

## 2020-12-06 NOTE — Telephone Encounter (Signed)
Patient's daughter said that patient's cyst has improved and she did not start antibiotic. They have only used the mupirocin. Advised patient's daughter to continue mupirocin until area is healed and let us know if cyst worsens.

## 2020-12-06 NOTE — Telephone Encounter (Signed)
-----   Message from Sandi Mealy, MD sent at 12/06/2020 12:22 PM EDT ----- Culture from cyst grew Staphylococcus lugdunensis which should be sensitive to cephalexin.  If she no longer has redness or pain at the site, she does not need to take the pill antibiotic, but if it is still bothersome and she has not started the antibiotic, recommend taking the cephalexin as prescribed.  MAs please call. Thank you!

## 2020-12-08 ENCOUNTER — Encounter: Payer: Self-pay | Admitting: Dermatology

## 2021-01-20 ENCOUNTER — Ambulatory Visit: Payer: Medicare PPO | Admitting: Dermatology

## 2021-01-20 ENCOUNTER — Other Ambulatory Visit: Payer: Self-pay

## 2021-01-20 DIAGNOSIS — L720 Epidermal cyst: Secondary | ICD-10-CM | POA: Diagnosis not present

## 2021-01-20 DIAGNOSIS — L578 Other skin changes due to chronic exposure to nonionizing radiation: Secondary | ICD-10-CM

## 2021-01-20 DIAGNOSIS — L570 Actinic keratosis: Secondary | ICD-10-CM | POA: Diagnosis not present

## 2021-01-20 NOTE — Patient Instructions (Addendum)

## 2021-01-20 NOTE — Progress Notes (Signed)
Follow-Up Visit   Subjective  [name] is a 87 y.o. female who presents for the following: Actinic Keratosis (Of the face - S/P PDT in October 2022, check for new or persistent skin lesions.).  The following portions of the chart were reviewed this encounter and updated as appropriate:   Tobacco  Allergies  Meds  Problems  Med Hx  Surg Hx  Fam Hx      Review of Systems:  No other skin or systemic complaints except as noted in HPI or Assessment and Plan.  Objective  Well appearing patient in no apparent distress; mood and affect are within normal limits.  A focused examination was performed including the face, hands, arms and abdomen. Relevant physical exam findings are noted in the Assessment and Plan.  L lower abdomen Dilated pore overlying firm small subcutaneous nodule  L wrist x 1, L dorsal hand x 2, L wrist x 2, L forearm x 1, R hand x 1, R wrist x 1, R forearm x 3 (11) Erythematous thin papules/macules with gritty scale.    Assessment & Plan  Epidermal inclusion cyst L lower abdomen  Improved, no longer inflamed -   Benign-appearing. Exam most consistent with an epidermal inclusion cyst. Discussed that a cyst is a benign growth that can grow over time and sometimes get irritated or inflamed. Recommend observation if it is not bothersome. Discussed option of surgical excision to remove it if it is growing, symptomatic, or other changes noted. Please call for new or changing lesions so they can be evaluated.    Related Medications mupirocin ointment (BACTROBAN) 2 % Apply 1 application topically daily.  AK (actinic keratosis) (11) L wrist x 1, L dorsal hand x 2, L wrist x 2, L forearm x 1, R hand x 1, R wrist x 1, R forearm x 3  Hypertrophic -   Prior to procedure, discussed risks of blister formation, small wound, skin dyspigmentation, or rare scar following cryotherapy. Recommend Vaseline ointment to treated areas while healing.  Actinic keratoses  are precancerous spots that appear secondary to cumulative UV radiation exposure/sun exposure over time. They are chronic with expected duration over 1 year. A portion of actinic keratoses will progress to squamous cell carcinoma of the skin. It is not possible to reliably predict which spots will progress to skin cancer and so treatment is recommended to prevent development of skin cancer.  Recommend daily broad spectrum sunscreen SPF 30+ to sun-exposed areas, reapply every 2 hours as needed.  Recommend staying in the shade or wearing long sleeves, sun glasses (UVA+UVB protection) and wide brim hats (4-inch brim around the entire circumference of the hat). Call for new or changing lesions.   Destruction of lesion - L wrist x 1, L dorsal hand x 2, L wrist x 2, L forearm x 1, R hand x 1, R wrist x 1, R forearm x 3 Complexity: simple   Destruction method: cryotherapy   Informed consent: discussed and consent obtained   Timeout:  patient name, date of birth, surgical site, and procedure verified Lesion destroyed using liquid nitrogen: Yes   Region frozen until ice ball extended beyond lesion: Yes   Outcome: patient tolerated procedure well with no complications   Post-procedure details: wound care instructions given    Actinic Damage - Severe, confluent actinic changes with pre-cancerous actinic keratoses  - Severe, chronic, not at goal, secondary to cumulative UV radiation exposure over time - diffuse scaly erythematous macules and papules with  underlying dyspigmentation - Discussed Prescription "Field Treatment" for Severe, Chronic Confluent Actinic Changes with Pre-Cancerous Actinic Keratoses Field treatment involves treatment of an entire area of skin that has confluent Actinic Changes (Sun/ Ultraviolet light damage) and PreCancerous Actinic Keratoses by method of PhotoDynamic Therapy (PDT) and/or prescription Topical Chemotherapy agents such as 5-fluorouracil, 5-fluorouracil/calcipotriene,  and/or imiquimod.  The purpose is to decrease the number of clinically evident and subclinical PreCancerous lesions to prevent progression to development of skin cancer by chemically destroying early precancer changes that may or may not be visible.  It has been shown to reduce the risk of developing skin cancer in the treated area. As a result of treatment, redness, scaling, crusting, and open sores may occur during treatment course. One or more than one of these methods may be used and may have to be used several times to control, suppress and eliminate the PreCancerous changes. Discussed treatment course, expected reaction, and possible side effects. - Recommend daily broad spectrum sunscreen SPF 30+ to sun-exposed areas, reapply every 2 hours as needed.  - Staying in the shade or wearing long sleeves, sun glasses (UVA+UVB  protection) and wide brim hats (4-inch brim around the entire circumference of the hat) are also recommended. - Call for new or changing lesions. - Improved but still severe and confluent at face, particularly right cheek and nose - Recommend two more rounds of PDT to the face.   Return for PDT of the face in January and another one in [place]; f/u with Dr. Gwen Pounds in .  Maylene Roes, CMA, am acting as scribe for Darden Dates, MD .  Documentation: I have reviewed the above documentation for accuracy and completeness, and I agree with the above.  Darden Dates, MD

## 2021-01-27 ENCOUNTER — Encounter: Payer: Self-pay | Admitting: Dermatology

## 2021-03-10 ENCOUNTER — Ambulatory Visit: Payer: Medicare PPO

## 2021-03-10 ENCOUNTER — Other Ambulatory Visit: Payer: Self-pay

## 2021-03-10 DIAGNOSIS — L570 Actinic keratosis: Secondary | ICD-10-CM

## 2021-03-10 MED ORDER — AMINOLEVULINIC ACID HCL 20 % EX SOLR
1.0000 "application " | Freq: Once | CUTANEOUS | Status: AC
  Administered 2021-03-10: 354 mg via TOPICAL

## 2021-03-10 NOTE — Progress Notes (Signed)
1. AK (actinic keratosis) Head - Anterior (Face)  Photodynamic therapy - Head - Anterior (Face) Procedure discussed: discussed risks, benefits, side effects. and alternatives   Prep: site scrubbed/prepped with acetone   Location:  Face Number of lesions:  Multiple Type of treatment:  Blue light Aminolevulinic Acid (see MAR for details): Levulan Number of Levulan sticks used:  1 Incubation time (minutes):  60 Number of minutes under lamp:  16 Number of seconds under lamp:  40 Cooling:  Floor fan Outcome: patient tolerated procedure well with no complications   Post-procedure details: sunscreen applied and aftercare instructions given to patient    Aminolevulinic Acid HCl 20 % SOLR 354 mg - Head - Anterior (Face)      

## 2021-03-10 NOTE — Patient Instructions (Signed)

## 2021-04-07 ENCOUNTER — Other Ambulatory Visit: Payer: Self-pay

## 2021-04-07 ENCOUNTER — Ambulatory Visit: Payer: Medicare PPO | Admitting: Dermatology

## 2021-04-07 DIAGNOSIS — L570 Actinic keratosis: Secondary | ICD-10-CM | POA: Diagnosis not present

## 2021-04-07 MED ORDER — AMINOLEVULINIC ACID HCL 20 % EX SOLR
1.0000 "application " | Freq: Once | CUTANEOUS | Status: AC
  Administered 2021-04-07: 354 mg via TOPICAL

## 2021-04-07 NOTE — Progress Notes (Signed)
Patient completed PDT therapy today.  1. AK (actinic keratosis) Head - Anterior (Face)  Photodynamic therapy - Head - Anterior (Face) Procedure discussed: discussed risks, benefits, side effects. and alternatives   Prep: site scrubbed/prepped with acetone   Location:  Face Number of lesions:  Multiple Type of treatment:  Blue light Aminolevulinic Acid (see MAR for details): Levulan Number of Levulan sticks used:  1 Incubation time (minutes):  60 Number of minutes under lamp:  16 Number of seconds under lamp:  40 Cooling:  Floor fan Outcome: patient tolerated procedure well with no complications   Post-procedure details: sunscreen applied    Aminolevulinic Acid HCl 20 % SOLR 354 mg - Head - Anterior (Face)   Patient provided samples of Solbar Suncreen and Vanicream face wash and moisturizer.   Documentation: I have reviewed the above documentation for accuracy and completeness, and I agree with the above.  Darden Dates, MD

## 2021-04-07 NOTE — Patient Instructions (Signed)

## 2021-04-18 ENCOUNTER — Encounter: Payer: Self-pay | Admitting: Dermatology

## 2021-04-28 ENCOUNTER — Ambulatory Visit: Payer: Medicare PPO | Admitting: Dermatology

## 2021-04-28 ENCOUNTER — Other Ambulatory Visit: Payer: Self-pay

## 2021-04-28 DIAGNOSIS — L578 Other skin changes due to chronic exposure to nonionizing radiation: Secondary | ICD-10-CM | POA: Diagnosis not present

## 2021-04-28 DIAGNOSIS — L570 Actinic keratosis: Secondary | ICD-10-CM | POA: Diagnosis not present

## 2021-04-28 DIAGNOSIS — L821 Other seborrheic keratosis: Secondary | ICD-10-CM | POA: Diagnosis not present

## 2021-04-28 DIAGNOSIS — L720 Epidermal cyst: Secondary | ICD-10-CM | POA: Diagnosis not present

## 2021-04-28 NOTE — Progress Notes (Unsigned)
° °  Follow-Up Visit   Subjective  [name] is a 88 y.o. female who presents for the following: Follow-up (Patient here today for 3 month ak follow up. Patient has had 3 light treatments to areas. ).  Ak at nose  Sk right upper arm   Epidermal cyst left lower abdomen   The patient has spots, moles and lesions to be evaluated, some may be new or changing and the patient has concerns that these could be cancer.    The following portions of the chart were reviewed this encounter and updated as appropriate:      Review of Systems: No other skin or systemic complaints except as noted in HPI or Assessment and Plan.   Objective  Well appearing patient in no apparent distress; mood and affect are within normal limits.  {Exam:[id number]::"A full examination was performed including scalp, head, eyes, ears, nose, lips, neck, chest, axillae, abdomen, back, buttocks, bilateral upper extremities, bilateral lower extremities, hands, feet, fingers, toes, fingernails, and toenails. All findings within normal limits unless otherwise noted below."}   Assessment & Plan   Seborrheic Keratoses - Stuck-on, waxy, tan-brown papules and/or plaques  - Benign-appearing - Discussed benign etiology and prognosis. - Observe - Call for any changes  Actinic Damage - chronic, secondary to cumulative UV radiation exposure/sun exposure over time - diffuse scaly erythematous macules with underlying dyspigmentation - Recommend daily broad spectrum sunscreen SPF 30+ to sun-exposed areas, reapply every 2 hours as needed.  - Recommend staying in the shade or wearing long sleeves, sun glasses (UVA+UVB protection) and wide brim hats (4-inch brim around the entire circumference of the hat). - Call for new or changing lesions.     No follow-ups on file. IAsher Muir, CMA, am acting as scribe for Armida Sans, MD.

## 2021-04-28 NOTE — Patient Instructions (Signed)
Actinic keratoses are precancerous spots that appear secondary to cumulative UV radiation exposure/sun exposure over time. They are chronic with expected duration over 1 year. A portion of actinic keratoses will progress to squamous cell carcinoma of the skin. It is not possible to reliably predict which spots will progress to skin cancer and so treatment is recommended to prevent development of skin cancer. ? ?Recommend daily broad spectrum sunscreen SPF 30+ to sun-exposed areas, reapply every 2 hours as needed.  ?Recommend staying in the shade or wearing long sleeves, sun glasses (UVA+UVB protection) and wide brim hats (4-inch brim around the entire circumference of the hat). ?Call for new or changing lesions.  ? ? ? ? ? ?If You Need Anything After Your Visit ? ?If you have any questions or concerns for your doctor, please call our main line at [phone] and press option 4 to reach your doctor's medical assistant. If no one answers, please leave a voicemail as directed and we will return your call as soon as possible. Messages left after 4 pm will be answered the following business day.  ? ?You may also send us a message via MyChart. We typically respond to MyChart messages within 1-2 business days. ? ?For prescription refills, please ask your pharmacy to contact our office. Our fax number is [phone]. ? ?If you have an urgent issue when the clinic is closed that cannot wait until the next business day, you can page your doctor at the number below.   ? ?Please note that while we do our best to be available for urgent issues outside of office hours, we are not available 24/7.  ? ?If you have an urgent issue and are unable to reach us, you may choose to seek medical care at your doctor's office, retail clinic, urgent care center, or emergency room. ? ?If you have a medical emergency, please immediately call 911 or go to the emergency department. ? ?Pager Numbers ? ?- Dr. Kowalski: [phone] ? ?- Dr. Moye:  [phone] ? ?- Dr. Stewart: [phone] ? ?In the event of inclement weather, please call our main line at [phone] for an update on the status of any delays or closures. ? ?Dermatology Medication Tips: ?Please keep the boxes that topical medications come in in order to help keep track of the instructions about where and how to use these. Pharmacies typically print the medication instructions only on the boxes and not directly on the medication tubes.  ? ?If your medication is too expensive, please contact our office at [phone] option 4 or send us a message through MyChart.  ? ?We are unable to tell what your co-pay for medications will be in advance as this is different depending on your insurance coverage. However, we may be able to find a substitute medication at lower cost or fill out paperwork to get insurance to cover a needed medication.  ? ?If a prior authorization is required to get your medication covered by your insurance company, please allow us 1-2 business days to complete this process. ? ?Drug prices often vary depending on where the prescription is filled and some pharmacies may offer cheaper prices. ? ?The website www.goodrx.com contains coupons for medications through different pharmacies. The prices here do not account for what the cost may be with help from insurance (it may be cheaper with your insurance), but the website can give you the price if you did not use any insurance.  ?- You can print the associated coupon and take it with   your prescription to the pharmacy.  ?- You may also stop by our office during regular business hours and pick up a GoodRx coupon card.  ?- If you need your prescription sent electronically to a different pharmacy, notify our office through [facility] MyChart or by phone at [phone] option 4. ? ? ? ? ?Si Usted Necesita Algo Despu?s de Su Visita ? ?Tambi?n puede enviarnos un mensaje a trav?s de MyChart. Por lo general respondemos a los mensajes de  MyChart en el transcurso de 1 a 2 d?as h?biles. ? ?Para renovar recetas, por favor pida a su farmacia que se ponga en contacto con nuestra oficina. Nuestro n?mero de fax es el [phone]. ? ?Si tiene un asunto urgente cuando la cl?nica est? cerrada y que no puede esperar hasta el siguiente d?a h?bil, puede llamar/localizar a su doctor(a) al n?mero que aparece a continuaci?n.  ? ?Por favor, tenga en cuenta que aunque hacemos todo lo posible para estar disponibles para asuntos urgentes fuera del horario de oficina, no estamos disponibles las 24 horas del d?a, los 7 d?as de la semana.  ? ?Si tiene un problema urgente y no puede comunicarse con nosotros, puede optar por buscar atenci?n m?dica  en el consultorio de su doctor(a), en una cl?nica privada, en un centro de atenci?n urgente o en una sala de emergencias. ? ?Si tiene una emergencia m?dica, por favor llame inmediatamente al 911 o vaya a la sala de emergencias. ? ?N?meros de b?per ? ?- Dr. Kowalski: [phone] ? ?- Dra. Moye: [phone] ? ?- Dra. Stewart: [phone] ? ?En caso de inclemencias del tiempo, por favor llame a nuestra l?nea principal al [phone] para una actualizaci?n sobre el estado de cualquier retraso o cierre. ? ?Consejos para la medicaci?n en dermatolog?a: ?Por favor, guarde las cajas en las que vienen los medicamentos de uso t?pico para ayudarle a seguir las instrucciones sobre d?nde y c?mo usarlos. Las farmacias generalmente imprimen las instrucciones del medicamento s?lo en las cajas y no directamente en los tubos del medicamento.  ? ?Si su medicamento es muy caro, por favor, p?ngase en contacto con nuestra oficina llamando al [phone] y presione la opci?n 4 o env?enos un mensaje a trav?s de MyChart.  ? ?No podemos decirle cu?l ser? su copago por los medicamentos por adelantado ya que esto es diferente dependiendo de la cobertura de su seguro. Sin embargo, es posible que podamos encontrar un medicamento sustituto a menor costo o  llenar un formulario para que el seguro cubra el medicamento que se considera necesario.  ? ?Si se requiere una autorizaci?n previa para que su compa??a de seguros cubra su medicamento, por favor perm?tanos de 1 a 2 d?as h?biles para completar este proceso. ? ?Los precios de los medicamentos var?an con frecuencia dependiendo del lugar de d?nde se surte la receta y alguna farmacias pueden ofrecer precios m?s baratos. ? ?El sitio web www.goodrx.com tiene cupones para medicamentos de diferentes farmacias. Los precios aqu? no tienen en cuenta lo que podr?a costar con la ayuda del seguro (puede ser m?s barato con su seguro), pero el sitio web puede darle el precio si no utiliz? ning?n seguro.  ?- Puede imprimir el cup?n correspondiente y llevarlo con su receta a la farmacia.  ?- Tambi?n puede pasar por nuestra oficina durante el horario de atenci?n regular y recoger una tarjeta de cupones de GoodRx.  ?- Si necesita que su receta se env?e electr?nicamente a una farmacia diferente, informe a nuestra oficina a trav?s de MyChart de [facility]   o por tel?fono llamando al [phone] y presione la opci?n 4. ? ?

## 2021-05-03 ENCOUNTER — Encounter: Payer: Self-pay | Admitting: Ophthalmology

## 2021-05-04 ENCOUNTER — Encounter: Payer: Self-pay | Admitting: Dermatology

## 2021-05-04 NOTE — Discharge Instructions (Signed)

## 2021-05-10 ENCOUNTER — Other Ambulatory Visit: Payer: Self-pay

## 2021-05-10 ENCOUNTER — Encounter: Payer: Self-pay | Admitting: Ophthalmology

## 2021-05-10 ENCOUNTER — Ambulatory Visit
Admission: RE | Admit: 2021-05-10 | Discharge: 2021-05-10 | Disposition: A | Discharge status: Home / Self Care | Payer: Medicare PPO | Attending: Ophthalmology | Admitting: Ophthalmology

## 2021-05-10 ENCOUNTER — Ambulatory Visit: Payer: Medicare PPO | Admitting: Anesthesiology

## 2021-05-10 ENCOUNTER — Encounter
Admission: RE | Disposition: A | Discharge status: Home / Self Care | Payer: Self-pay | Source: Home / Self Care | Attending: Ophthalmology

## 2021-05-10 DIAGNOSIS — H2512 Age-related nuclear cataract, left eye: Secondary | ICD-10-CM | POA: Diagnosis present

## 2021-05-10 HISTORY — DX: Other mechanical complication of other internal orthopedic devices, implants and grafts, initial encounter: T84.498A

## 2021-05-10 HISTORY — PX: CATARACT EXTRACTION W/PHACO: SHX586

## 2021-05-10 HISTORY — DX: Presence of dental prosthetic device (complete) (partial): Z97.2

## 2021-05-10 SURGERY — PHACOEMULSIFICATION, CATARACT, WITH IOL INSERTION
Anesthesia: Monitor Anesthesia Care | Site: Eye | Laterality: Left

## 2021-05-10 MED ORDER — TETRACAINE HCL 0.5 % OP SOLN
1.0000 [drp] | OPHTHALMIC | Status: DC | PRN
  Administered 2021-05-10 (×3): 1 [drp] via OPHTHALMIC

## 2021-05-10 MED ORDER — ACETAMINOPHEN 325 MG PO TABS: 325.0000 mg | ORAL_TABLET | ORAL | Status: DC | PRN

## 2021-05-10 MED ORDER — ARMC OPHTHALMIC DILATING DROPS
1.0000 "application " | OPHTHALMIC | Status: DC | PRN
  Administered 2021-05-10 (×3): 1 via OPHTHALMIC

## 2021-05-10 MED ORDER — SIGHTPATH DOSE#1 BSS IO SOLN
INTRAOCULAR | Status: DC | PRN
  Administered 2021-05-10: 15 mL via INTRAOCULAR

## 2021-05-10 MED ORDER — ACETAMINOPHEN 160 MG/5ML PO SOLN: 325.0000 mg | ORAL | Status: DC | PRN

## 2021-05-10 MED ORDER — MOXIFLOXACIN HCL 0.5 % OP SOLN
OPHTHALMIC | Status: DC | PRN
  Administered 2021-05-10: 0.2 mL via OPHTHALMIC

## 2021-05-10 MED ORDER — ONDANSETRON HCL 4 MG/2ML IJ SOLN: 4.0000 mg | Freq: Once | INTRAMUSCULAR | Status: DC | PRN

## 2021-05-10 MED ORDER — FENTANYL CITRATE (PF) 100 MCG/2ML IJ SOLN: INTRAMUSCULAR | Status: DC | PRN

## 2021-05-10 MED ORDER — SIGHTPATH DOSE#1 NA CHONDROIT SULF-NA HYALURON 40-17 MG/ML IO SOLN
INTRAOCULAR | Status: DC | PRN
  Administered 2021-05-10: 1 mL via INTRAOCULAR

## 2021-05-10 MED ORDER — MIDAZOLAM HCL 2 MG/2ML IJ SOLN
INTRAMUSCULAR | Status: DC | PRN
  Administered 2021-05-10: .5 mg via INTRAVENOUS

## 2021-05-10 MED ORDER — LIDOCAINE HCL (PF) 2 % IJ SOLN
INTRAMUSCULAR | Status: DC | PRN
  Administered 2021-05-10: 2 mL

## 2021-05-10 MED ORDER — SIGHTPATH DOSE#1 BSS IO SOLN
INTRAOCULAR | Status: DC | PRN
  Administered 2021-05-10: 75 mL via OPHTHALMIC

## 2021-05-10 MED ORDER — MIDAZOLAM HCL 2 MG/2ML IJ SOLN: INTRAMUSCULAR | Status: DC | PRN

## 2021-05-10 MED ORDER — BRIMONIDINE TARTRATE-TIMOLOL 0.2-0.5 % OP SOLN
OPHTHALMIC | Status: DC | PRN
  Administered 2021-05-10: 1 [drp] via OPHTHALMIC

## 2021-05-10 SURGICAL SUPPLY — 12 items
CANNULA ANT/CHMB 27G (MISCELLANEOUS) IMPLANT
CANNULA ANT/CHMB 27GA (MISCELLANEOUS) IMPLANT
CATARACT SUITE SIGHTPATH (MISCELLANEOUS) ×2 IMPLANT
FEE CATARACT SUITE SIGHTPATH (MISCELLANEOUS) ×1 IMPLANT
GLOVE SURG ENC TEXT LTX SZ8 (GLOVE) ×2 IMPLANT
GLOVE SURG TRIUMPH 8.0 PF LTX (GLOVE) ×2 IMPLANT
LENS IOL TECNIS EYHANCE 22.0 (Intraocular Lens) ×1 IMPLANT
NDL FILTER BLUNT 18X1 1/2 (NEEDLE) ×1 IMPLANT
NEEDLE FILTER BLUNT 18X 1/2SAF (NEEDLE) ×1
NEEDLE FILTER BLUNT 18X1 1/2 (NEEDLE) ×1 IMPLANT
SYR 3ML LL SCALE MARK (SYRINGE) ×2 IMPLANT
WATER STERILE IRR 250ML POUR (IV SOLUTION) ×2 IMPLANT

## 2021-05-10 NOTE — Transfer of Care (Addendum)
Immediate Anesthesia Transfer of Care Note ? ?Patient: [name] ? ?Procedure(s) Performed: CATARACT EXTRACTION PHACO AND INTRAOCULAR LENS PLACEMENT (IOC) LEFT 12.49 01:08.6 (Left: Eye) ? ?Patient Location: PACU ? ?Anesthesia Type: MAC ? ?Level of Consciousness: awake, alert  and patient cooperative ? ?Airway and Oxygen Therapy: Patient Spontanous Breathing and Patient connected to supplemental oxygen ? ?Post-op Assessment: Post-op Vital signs reviewed, Patient's Cardiovascular Status Stable, Respiratory Function Stable, Patent Airway and No signs of Nausea or vomiting ? ?Post-op Vital Signs: Reviewed and stable ? ?Complications: No notable events documented. ? ?

## 2021-05-10 NOTE — Anesthesia Procedure Notes (Signed)
Procedure Name: [place] ?Date/Time: 05/10/2021 8:25 AM ?Performed by: Dionne Bucy, CRNA ?Pre-anesthesia Checklist: Patient identified, Emergency Drugs available, Suction available, Patient being monitored and Timeout performed ?Patient Re-evaluated:Patient Re-evaluated prior to induction ?Oxygen Delivery Method: Nasal cannula ?Placement Confirmation: positive ETCO2 ? ? ? ? ?

## 2021-05-10 NOTE — Op Note (Signed)
PREOPERATIVE DIAGNOSIS:  Nuclear sclerotic cataract of the left eye. ?  ?POSTOPERATIVE DIAGNOSIS:  Nuclear sclerotic cataract of the left eye. ?  ?OPERATIVE PROCEDURE:ORPROCALL@ ?  ?SURGEON:  Galen Manila, MD. ?  ?ANESTHESIA: ? ?Anesthesiologist: Jola Babinski, MD ?CRNA: Lily Kocher, CRNA ? ?1.      Managed anesthesia care. ?2.     0.10ml of Shugarcaine was instilled following the paracentesis ?  ?COMPLICATIONS:  None. ?  ?TECHNIQUE:   Stop and chop ?  ?DESCRIPTION OF PROCEDURE:  The patient was examined and consented in the preoperative holding area where the aforementioned topical anesthesia was applied to the left eye and then brought back to the Operating Room where the left eye was prepped and draped in the usual sterile ophthalmic fashion and a lid speculum was placed. A paracentesis was created with the side port blade and the anterior chamber was filled with viscoelastic. A near clear corneal incision was performed with the steel keratome. A continuous curvilinear capsulorrhexis was performed with a cystotome followed by the capsulorrhexis forceps. Hydrodissection and hydrodelineation were carried out with BSS on a blunt cannula. The lens was removed in a stop and chop  technique and the remaining cortical material was removed with the irrigation-aspiration handpiece. The capsular bag was inflated with viscoelastic and the Technis [id number] lens was placed in the capsular bag without complication. The remaining viscoelastic was removed from the eye with the irrigation-aspiration handpiece. The wounds were hydrated. The anterior chamber was flushed with BSS and the eye was inflated to physiologic pressure. 0.62ml Vigamox was placed in the anterior chamber. The wounds were found to be water tight. The eye was dressed with Combigan. The patient was given protective glasses to wear throughout the day and a shield with which to sleep tonight. The patient was also given drops with which to begin a drop regimen  today and will follow-up with me in one day. ?Implant Name Type Inv. Item Serial No. Manufacturer Lot No. LRB No. Used Action  ?LENS IOL TECNIS EYHANCE 22.0 - [id number] Intraocular Lens LENS IOL TECNIS EYHANCE 22.0 [id number] [employer]  Left 1 Implanted  ?  ?Procedure(s): ?CATARACT EXTRACTION PHACO AND INTRAOCULAR LENS PLACEMENT (IOC) LEFT 12.49 01:08.6 (Left) ? ?Electronically signed: Galen Manila 05/10/2021 8:46 AM ? ?

## 2021-05-10 NOTE — H&P (Signed)
[facility]  ? ?Primary Care Physician:  Marguarite Arbour, MD ?Ophthalmologist: Dr. Druscilla Brownie ? ?Pre-Procedure History & Physical: ?HPI:  [name] is a 88 y.o. female here for cataract surgery. ?  ?Past Medical History:  ?Diagnosis Date  ? Acute esophagitis   ? E. coli sepsis (HCC) 08/18/2019  ? GERD (gastroesophageal reflux disease)   ? Hemorrhoids 06/18/2018  ? High cholesterol   ? Hypertension   ? Ileus (HCC) 06/02/2018  ? Long term prescription opiate use 08/22/2017  ? Protrusion of musculoskeletal implant (HCC)   ? Rods placed in back protrude (mid-back)  ? Uses walker   ? Vertigo   ? long ago.  no current issues  ? Vertigo   ? 1 episode approx 2015  ? Weakness of left foot   ? Wears dentures   ? full upper  ? Wears hearing aid in both ears   ? ? ?Past Surgical History:  ?Procedure Laterality Date  ? ABDOMINAL SURGERY    ? BACK SURGERY    ? BIOPSY N/A 04/08/2019  ? Procedure: BIOPSY;  Surgeon: Toney Reil, MD;  Location: [facility];  Service: Endoscopy;  Laterality: N/A;  ? CHOLECYSTECTOMY    ? ESOPHAGOGASTRODUODENOSCOPY N/A 06/04/2018  ? Procedure: ESOPHAGOGASTRODUODENOSCOPY (EGD);  Surgeon: Pasty Spillers, MD;  Location: [facility] END[facility]COPY;  Service: Endoscopy;  Laterality: N/A;  ? ESOPHAGOGASTRODUODENOSCOPY N/A 06/05/2018  ? Procedure: ESOPHAGOGASTRODUODENOSCOPY (EGD);  Surgeon: Midge Minium, MD;  Location: [facility] ENDOSCOPY;  Service: Endoscopy;  Laterality: N/A;  use Fluoro bed, X-ray is aware  ? ESOPHAGOGASTRODUODENOSCOPY N/A 02/28/2019  ? Procedure: ESOPHAGOGASTRODUODENOSCOPY (EGD);  Surgeon: Pasty Spillers, MD;  Location: [facility] ENDOSCOPY;  Service: Endoscopy;  Laterality: N/A;  ? ESOPHAGOGASTRODUODENOSCOPY (EGD) WITH PROPOFOL N/A 04/08/2019  ? Procedure: ESOPHAGOGASTRODUODENOSCOPY (EGD) WITH PROPOFOL;  Surgeon: Toney Reil, MD;  Location: [facility];  Service: Endoscopy;  Laterality: N/A;  priority 4  ? JOINT REPLACEMENT    ? REPLACEMENT TOTAL KNEE Right    ? TOE AMPUTATION Left   ? great toe  ? TONSILLECTOMY    ? ? ?Prior to Admission medications   ?Medication Sig Start Date End Date Taking? Authorizing Provider  ?acetaminophen (TYLENOL) 500 MG tablet Take 500 mg by mouth every 6 (six) hours as needed.   Yes [provider]  ?atorvastatin (LIPITOR) 20 MG tablet Take by mouth. 09/15/20  Yes [provider]  ?Cyanocobalamin (VITAMIN B-12 PO) Take 1 tablet by mouth daily.    Yes [provider]  ?Dimethicone-Zinc Oxide-Vit A-D (A & D ZINC OXIDE) CREA Apply topically. 11/26/19  Yes [provider]  ?fentaNYL (DURAGESIC) 100 MCG/HR Place onto the skin. 10/01/20  Yes [provider]  ?folic acid (FOLVITE) 1 MG tablet Take 1 mg by mouth daily.  04/10/18  Yes [provider]  ?Multiple Vitamin (MULTIVITAMIN) tablet Take 1 tablet by mouth daily.   Yes [provider]  ?mupirocin ointment (BACTROBAN) 2 % Apply 1 application topically daily. 11/25/20  Yes Moye, [place], MD  ?pantoprazole (PROTONIX) 40 MG tablet Take 1 tablet by mouth 2 (two) times daily. 09/15/20  Yes [provider]  ?polyethylene glycol (MIRALAX / GLYCOLAX) 17 g packet Take 17 g by mouth daily as needed.   Yes [provider]  ? ? ?Allergies as of 04/01/2021 - Review Complete 01/27/2021  ?Allergen Reaction Noted  ? Colesevelam Other (See Comments) 06/02/2018  ? Lovastatin Other (See Comments) 06/02/2018  ? Tetracyclines & related Rash 06/02/2018  ? ? ?  Family History  ?Problem Relation Age of Onset  ? Heart attack Mother   ? Heart attack Father   ? ? ?Social History  ? ?Socioeconomic History  ? Marital status: Married  ?  Spouse name: Not on file  ? Number of children: Not on file  ? Years of education: Not on file  ? Highest education level: Not on file  ?Occupational History  ? Not on file  ?Tobacco Use  ? Smoking status: Never  ? Smokeless tobacco: Never  ?Vaping Use  ? Vaping Use: Never used  ?Substance and Sexual Activity  ?  Alcohol use: Never  ? Drug use: Never  ? Sexual activity: Not on file  ?Other Topics Concern  ? Not on file  ?Social History Narrative  ? Not on file  ? ?Social Determinants of Health  ? ?Financial Resource Strain: Not on file  ?Food Insecurity: Not on file  ?Transportation Needs: Not on file  ?Physical Activity: Not on file  ?Stress: Not on file  ?Social Connections: Not on file  ?Intimate Partner Violence: Not on file  ? ? ?Review of Systems: ?See HPI, otherwise negative ROS ? ?Physical Exam: ?BP (!) 154/66   Pulse 67   Temp 97.7 ?F (36.5 ?C) (Temporal)   Resp 18   Ht 5\' 2"  (1.575 m)   Wt 53.5 kg   SpO2 93%   BMI 21.58 kg/m?  ?General:   Alert, cooperative in NAD ?Head:  Normocephalic and atraumatic. ?Respiratory:  Normal work of breathing. ?Cardiovascular:  RRR ? ?Impression/Plan: ?[name] is here for cataract surgery. ? ?Risks, benefits, limitations, and alternatives regarding cataract surgery have been reviewed with the patient.  Questions have been answered.  All parties agreeable. ? ? ?Sharol Harness, MD  05/10/2021, 8:16 AM ? ? ?

## 2021-05-10 NOTE — Anesthesia Preprocedure Evaluation (Signed)
Anesthesia Evaluation  ?Patient identified by MRN, date of birth, ID band ?Patient awake ? ? ? ?Reviewed: ?Allergy & Precautions, NPO status  ? ?Airway ?Mallampati: II ? ?TM Distance: >3 FB ? ? ? ? Dental ?  ?Pulmonary ? ?  ?Pulmonary exam normal ? ? ? ? ? ? ? Cardiovascular ?hypertension,  ?Rhythm:Regular Rate:Normal ? ?HLD ?  ?Neuro/Psych ?  ? GI/Hepatic ?GERD  ,  ?Endo/Other  ?diabetes, Type 2 ? Renal/GU ?Renal disease (diabetic nephropathy)  ? ?  ?Musculoskeletal ? ?(+) Arthritis ,  ? Abdominal ?  ?Peds ? Hematology ?  ?Anesthesia Other Findings ? ? Reproductive/Obstetrics ? ?  ? ? ? ? ? ? ? ? ? ? ? ? ? ?  ?  ? ? ? ? ? ? ? ? ?Anesthesia Physical ?Anesthesia Plan ? ?ASA: 3 ? ?Anesthesia Plan: MAC  ? ?Post-op Pain Management: Minimal or no pain anticipated  ? ?Induction: Intravenous ? ?PONV Risk Score and Plan: TIVA, Midazolam and Treatment may vary due to age or medical condition ? ?Airway Management Planned: Natural Airway and Nasal Cannula ? ?Additional Equipment:  ? ?Intra-op Plan:  ? ?Post-operative Plan:  ? ?Informed Consent: I have reviewed the patients History and Physical, chart, labs and discussed the procedure including the risks, benefits and alternatives for the proposed anesthesia with the patient or authorized representative who has indicated his/her understanding and acceptance.  ? ? ? ? ? ?Plan Discussed with: CRNA ? ?Anesthesia Plan Comments:   ? ? ? ? ? ? ?Anesthesia Quick Evaluation ? ?

## 2021-05-10 NOTE — Anesthesia Postprocedure Evaluation (Signed)
Anesthesia Post Note ? ?Patient: [name] ? ?Procedure(s) Performed: CATARACT EXTRACTION PHACO AND INTRAOCULAR LENS PLACEMENT (IOC) LEFT 12.49 01:08.6 (Left: Eye) ? ? ?  ?Patient location during evaluation: PACU ?Anesthesia Type: MAC ?Level of consciousness: awake ?Pain management: pain level controlled ?Vital Signs Assessment: post-procedure vital signs reviewed and stable ?Respiratory status: respiratory function stable ?Cardiovascular status: stable ?Postop Assessment: no apparent nausea or vomiting ?Anesthetic complications: no ? ? ?No notable events documented. ? ?Veda Canning ? ? ? ? ? ?

## 2021-05-11 ENCOUNTER — Encounter: Payer: Self-pay | Admitting: Ophthalmology

## 2022-01-02 ENCOUNTER — Ambulatory Visit: Payer: Medicare PPO | Admitting: Dermatology

## 2022-01-02 DIAGNOSIS — L578 Other skin changes due to chronic exposure to nonionizing radiation: Secondary | ICD-10-CM

## 2022-01-02 DIAGNOSIS — L820 Inflamed seborrheic keratosis: Secondary | ICD-10-CM

## 2022-01-02 DIAGNOSIS — L814 Other melanin hyperpigmentation: Secondary | ICD-10-CM

## 2022-01-02 DIAGNOSIS — L570 Actinic keratosis: Secondary | ICD-10-CM | POA: Diagnosis not present

## 2022-01-02 DIAGNOSIS — L821 Other seborrheic keratosis: Secondary | ICD-10-CM

## 2022-01-02 NOTE — Patient Instructions (Addendum)
Actinic keratoses are precancerous spots that appear secondary to cumulative UV radiation exposure/sun exposure over time. They are chronic with expected duration over 1 year. A portion of actinic keratoses will progress to squamous cell carcinoma of the skin. It is not possible to reliably predict which spots will progress to skin cancer and so treatment is recommended to prevent development of skin cancer.  Recommend daily broad spectrum sunscreen SPF 30+ to sun-exposed areas, reapply every 2 hours as needed.  Recommend staying in the shade or wearing long sleeves, sun glasses (UVA+UVB protection) and wide brim hats (4-inch brim around the entire circumference of the hat). Call for new or changing lesions.   Cryotherapy Aftercare  Wash gently with soap and water everyday.   Apply Vaseline and Band-Aid daily until healed.   Seborrheic Keratosis  What causes seborrheic keratoses? Seborrheic keratoses are harmless, common skin growths that first appear during adult life.  As time goes by, more growths appear.  Some people may develop a large number of them.  Seborrheic keratoses appear on both covered and uncovered body parts.  They are not caused by sunlight.  The tendency to develop seborrheic keratoses can be inherited.  They vary in color from skin-colored to gray, brown, or even black.  They can be either smooth or have a rough, warty surface.   Seborrheic keratoses are superficial and look as if they were stuck on the skin.  Under the microscope this type of keratosis looks like layers upon layers of skin.  That is why at times the top layer may seem to fall off, but the rest of the growth remains and re-grows.    Treatment Seborrheic keratoses do not need to be treated, but can easily be removed in the office.  Seborrheic keratoses often cause symptoms when they rub on clothing or jewelry.  Lesions can be in the way of shaving.  If they become inflamed, they can cause itching, soreness, or  burning.  Removal of a seborrheic keratosis can be accomplished by freezing, burning, or surgery. If any spot bleeds, scabs, or grows rapidly, please return to have it checked, as these can be an indication of a skin cancer.          Due to recent changes in healthcare laws, you may see results of your pathology and/or laboratory studies on MyChart before the doctors have had a chance to review them. We understand that in some cases there may be results that are confusing or concerning to you. Please understand that not all results are received at the same time and often the doctors may need to interpret multiple results in order to provide you with the best plan of care or course of treatment. Therefore, we ask that you please give us 2 business days to thoroughly review all your results before contacting the office for clarification. Should we see a critical lab result, you will be contacted sooner.   If You Need Anything After Your Visit  If you have any questions or concerns for your doctor, please call our main line at [phone] and press option 4 to reach your doctor's medical assistant. If no one answers, please leave a voicemail as directed and we will return your call as soon as possible. Messages left after 4 pm will be answered the following business day.   You may also send us a message via MyChart. We typically respond to MyChart messages within 1-2 business days.  For prescription refills, please ask your pharmacy   to contact our office. Our fax number is [phone].  If you have an urgent issue when the clinic is closed that cannot wait until the next business day, you can page your doctor at the number below.    Please note that while we do our best to be available for urgent issues outside of office hours, we are not available 24/7.   If you have an urgent issue and are unable to reach us, you may choose to seek medical care at your doctor's office, retail clinic,  urgent care center, or emergency room.  If you have a medical emergency, please immediately call 911 or go to the emergency department.  Pager Numbers  - Dr. Kowalski: [phone]  - Dr. Moye: [phone]  - Dr. Stewart: [phone]  In the event of inclement weather, please call our main line at [phone] for an update on the status of any delays or closures.  Dermatology Medication Tips: Please keep the boxes that topical medications come in in order to help keep track of the instructions about where and how to use these. Pharmacies typically print the medication instructions only on the boxes and not directly on the medication tubes.   If your medication is too expensive, please contact our office at [phone] option 4 or send us a message through MyChart.   We are unable to tell what your co-pay for medications will be in advance as this is different depending on your insurance coverage. However, we may be able to find a substitute medication at lower cost or fill out paperwork to get insurance to cover a needed medication.   If a prior authorization is required to get your medication covered by your insurance company, please allow us 1-2 business days to complete this process.  Drug prices often vary depending on where the prescription is filled and some pharmacies may offer cheaper prices.  The website www.goodrx.com contains coupons for medications through different pharmacies. The prices here do not account for what the cost may be with help from insurance (it may be cheaper with your insurance), but the website can give you the price if you did not use any insurance.  - You can print the associated coupon and take it with your prescription to the pharmacy.  - You may also stop by our office during regular business hours and pick up a GoodRx coupon card.  - If you need your prescription sent electronically to a different pharmacy, notify our office through [place]  MyChart or by phone at [phone] option 4.     Si Usted Necesita Algo Despus de Su Visita  Tambin puede enviarnos un mensaje a travs de MyChart. Por lo general respondemos a los mensajes de MyChart en el transcurso de 1 a 2 das hbiles.  Para renovar recetas, por favor pida a su farmacia que se ponga en contacto con nuestra oficina. Nuestro nmero de fax es el [phone].  Si tiene un asunto urgente cuando la clnica est cerrada y que no puede esperar hasta el siguiente da hbil, puede llamar/localizar a su doctor(a) al nmero que aparece a continuacin.   Por favor, tenga en cuenta que aunque hacemos todo lo posible para estar disponibles para asuntos urgentes fuera del horario de oficina, no estamos disponibles las 24 horas del da, los 7 das de la semana.   Si tiene un problema urgente y no puede comunicarse con nosotros, puede optar por buscar atencin mdica  en el consultorio de su doctor(a), en una   clnica privada, en un centro de atencin urgente o en una sala de emergencias.  Si tiene una emergencia mdica, por favor llame inmediatamente al 911 o vaya a la sala de emergencias.  Nmeros de bper  - Dr. Kowalski: [phone]  - Dra. Moye: [phone]  - Dra. Stewart: [phone]  En caso de inclemencias del tiempo, por favor llame a nuestra lnea principal al [phone] para una actualizacin sobre el estado de cualquier retraso o cierre.  Consejos para la medicacin en dermatologa: Por favor, guarde las cajas en las que vienen los medicamentos de uso tpico para ayudarle a seguir las instrucciones sobre dnde y cmo usarlos. Las farmacias generalmente imprimen las instrucciones del medicamento slo en las cajas y no directamente en los tubos del medicamento.   Si su medicamento es muy caro, por favor, pngase en contacto con nuestra oficina llamando al [phone] y presione la opcin 4 o envenos un mensaje a travs de MyChart.   No podemos decirle cul  ser su copago por los medicamentos por adelantado ya que esto es diferente dependiendo de la cobertura de su seguro. Sin embargo, es posible que podamos encontrar un medicamento sustituto a menor costo o llenar un formulario para que el seguro cubra el medicamento que se considera necesario.   Si se requiere una autorizacin previa para que su compaa de seguros cubra su medicamento, por favor permtanos de 1 a 2 das hbiles para completar este proceso.  Los precios de los medicamentos varan con frecuencia dependiendo del lugar de dnde se surte la receta y alguna farmacias pueden ofrecer precios ms baratos.  El sitio web www.goodrx.com tiene cupones para medicamentos de diferentes farmacias. Los precios aqu no tienen en cuenta lo que podra costar con la ayuda del seguro (puede ser ms barato con su seguro), pero el sitio web puede darle el precio si no utiliz ningn seguro.  - Puede imprimir el cupn correspondiente y llevarlo con su receta a la farmacia.  - Tambin puede pasar por nuestra oficina durante el horario de atencin regular y recoger una tarjeta de cupones de GoodRx.  - Si necesita que su receta se enve electrnicamente a una farmacia diferente, informe a nuestra oficina a travs de MyChart de [place] o por telfono llamando al [phone] y presione la opcin 4.  

## 2022-01-02 NOTE — Progress Notes (Signed)
Follow-Up Visit   Subjective  [name] is a 88 y.o. female who presents for the following: Actinic Keratosis (8 month ak follow up, patient reports still feeling some roughness at face after pdt treatment. ). The patient has spots, moles and lesions to be evaluated, some may be new or changing and the patient has concerns that these could be cancer.  The following portions of the chart were reviewed this encounter and updated as appropriate:  Tobacco  Allergies  Meds  Problems  Med Hx  Surg Hx  Fam Hx     Review of Systems: No other skin or systemic complaints except as noted in HPI or Assessment and Plan.  Objective  Well appearing patient in no apparent distress; mood and affect are within normal limits.  A focused examination was performed including face, nose, hands, arms. Relevant physical exam findings are noted in the Assessment and Plan.  face x 5 (5) Erythematous thin papules/macules with gritty scale.   face x 4 (4) Erythematous stuck-on, waxy papule or plaque   Assessment & Plan  Actinic keratosis (5) face x 5  Actinic keratoses are precancerous spots that appear secondary to cumulative UV radiation exposure/sun exposure over time. They are chronic with expected duration over 1 year. A portion of actinic keratoses will progress to squamous cell carcinoma of the skin. It is not possible to reliably predict which spots will progress to skin cancer and so treatment is recommended to prevent development of skin cancer.  Recommend daily broad spectrum sunscreen SPF 30+ to sun-exposed areas, reapply every 2 hours as needed.  Recommend staying in the shade or wearing long sleeves, sun glasses (UVA+UVB protection) and wide brim hats (4-inch brim around the entire circumference of the hat). Call for new or changing lesions.  Destruction of lesion - face x 5 Complexity: simple   Destruction method: cryotherapy   Informed consent: discussed and consent obtained    Timeout:  patient name, date of birth, surgical site, and procedure verified Lesion destroyed using liquid nitrogen: Yes   Region frozen until ice ball extended beyond lesion: Yes   Outcome: patient tolerated procedure well with no complications   Post-procedure details: wound care instructions given   Additional details:  Prior to procedure, discussed risks of blister formation, small wound, skin dyspigmentation, or rare scar following cryotherapy. Recommend Vaseline ointment to treated areas while healing.   Inflamed seborrheic keratosis (4) face x 4  Symptomatic, irritating, patient would like treated.  Destruction of lesion - face x 4 Complexity: simple   Destruction method: cryotherapy   Informed consent: discussed and consent obtained   Timeout:  patient name, date of birth, surgical site, and procedure verified Lesion destroyed using liquid nitrogen: Yes   Region frozen until ice ball extended beyond lesion: Yes   Outcome: patient tolerated procedure well with no complications   Post-procedure details: wound care instructions given    Seborrheic Keratoses - Stuck-on, waxy, tan-brown papules and/or plaques  - Benign-appearing - Discussed benign etiology and prognosis. - Observe - Call for any changes  Lentigines - Scattered tan macules - Due to sun exposure - Benign-appearing, observe - Recommend daily broad spectrum sunscreen SPF 30+ to sun-exposed areas, reapply every 2 hours as needed. - Call for any changes  Actinic Damage - chronic, secondary to cumulative UV radiation exposure/sun exposure over time - diffuse scaly erythematous macules with underlying dyspigmentation - Recommend daily broad spectrum sunscreen SPF 30+ to sun-exposed areas, reapply every 2 hours  as needed.  - Recommend staying in the shade or wearing long sleeves, sun glasses (UVA+UVB protection) and wide brim hats (4-inch brim around the entire circumference of the hat). - Call for new or  changing lesions.  Return in about 1 year (around 01/03/2023) for TBSE.  IRuthell Rummage, CMA, am acting as scribe for Sarina Ser, MD. Documentation: I have reviewed the above documentation for accuracy and completeness, and I agree with the above.  Sarina Ser, MD

## 2022-01-07 ENCOUNTER — Encounter: Payer: Self-pay | Admitting: Dermatology

## 2023-01-01 ENCOUNTER — Ambulatory Visit: Payer: Medicare PPO | Admitting: Dermatology

## 2023-01-01 DIAGNOSIS — L812 Freckles: Secondary | ICD-10-CM

## 2023-01-01 DIAGNOSIS — D692 Other nonthrombocytopenic purpura: Secondary | ICD-10-CM

## 2023-01-01 DIAGNOSIS — L814 Other melanin hyperpigmentation: Secondary | ICD-10-CM

## 2023-01-01 DIAGNOSIS — L570 Actinic keratosis: Secondary | ICD-10-CM | POA: Diagnosis not present

## 2023-01-01 DIAGNOSIS — L578 Other skin changes due to chronic exposure to nonionizing radiation: Secondary | ICD-10-CM | POA: Diagnosis not present

## 2023-01-01 DIAGNOSIS — D229 Melanocytic nevi, unspecified: Secondary | ICD-10-CM

## 2023-01-01 DIAGNOSIS — I878 Other specified disorders of veins: Secondary | ICD-10-CM

## 2023-01-01 DIAGNOSIS — W908XXA Exposure to other nonionizing radiation, initial encounter: Secondary | ICD-10-CM

## 2023-01-01 DIAGNOSIS — D1801 Hemangioma of skin and subcutaneous tissue: Secondary | ICD-10-CM

## 2023-01-01 DIAGNOSIS — Z1283 Encounter for screening for malignant neoplasm of skin: Secondary | ICD-10-CM

## 2023-01-01 DIAGNOSIS — Z872 Personal history of diseases of the skin and subcutaneous tissue: Secondary | ICD-10-CM

## 2023-01-01 NOTE — Patient Instructions (Addendum)
Actinic keratoses are precancerous spots that appear secondary to cumulative UV radiation exposure/sun exposure over time. They are chronic with expected duration over 1 year. A portion of actinic keratoses will progress to squamous cell carcinoma of the skin. It is not possible to reliably predict which spots will progress to skin cancer and so treatment is recommended to prevent development of skin cancer.  Recommend daily broad spectrum sunscreen SPF 30+ to sun-exposed areas, reapply every 2 hours as needed.  Recommend staying in the shade or wearing long sleeves, sun glasses (UVA+UVB protection) and wide brim hats (4-inch brim around the entire circumference of the hat). Call for new or changing lesions.    Cryotherapy Aftercare  Wash gently with soap and water everyday.   Apply Vaseline and Band-Aid daily until healed.     Melanoma ABCDEs  Melanoma is the most dangerous type of skin cancer, and is the leading cause of death from skin disease.  You are more likely to develop melanoma if you: Have light-colored skin, light-colored eyes, or red or blond hair Spend a lot of time in the sun Tan regularly, either outdoors or in a tanning bed Have had blistering sunburns, especially during childhood Have a close family member who has had a melanoma Have atypical moles or large birthmarks  Early detection of melanoma is key since treatment is typically straightforward and cure rates are extremely high if we catch it early.   The first sign of melanoma is often a change in a mole or a new dark spot.  The ABCDE system is a way of remembering the signs of melanoma.  A for asymmetry:  The two halves do not match. B for border:  The edges of the growth are irregular. C for color:  A mixture of colors are present instead of an even brown color. D for diameter:  Melanomas are usually (but not always) greater than 6mm - the size of a pencil eraser. E for evolution:  The spot keeps changing in  size, shape, and color.  Please check your skin once per month between visits. You can use a small mirror in front and a large mirror behind you to keep an eye on the back side or your body.   If you see any new or changing lesions before your next follow-up, please call to schedule a visit.  Please continue daily skin protection including broad spectrum sunscreen SPF 30+ to sun-exposed areas, reapplying every 2 hours as needed when you're outdoors.   Staying in the shade or wearing long sleeves, sun glasses (UVA+UVB protection) and wide brim hats (4-inch brim around the entire circumference of the hat) are also recommended for sun protection.    Due to recent changes in healthcare laws, you may see results of your pathology and/or laboratory studies on MyChart before the doctors have had a chance to review them. We understand that in some cases there may be results that are confusing or concerning to you. Please understand that not all results are received at the same time and often the doctors may need to interpret multiple results in order to provide you with the best plan of care or course of treatment. Therefore, we ask that you please give [place] 2 business days to thoroughly review all your results before contacting the office for clarification. Should we see a critical lab result, you will be contacted sooner.   If You Need Anything After Your Visit  If you have any questions or concerns for  your doctor, please call our main line at [phone] and press option 4 to reach your doctor's medical assistant. If no one answers, please leave a voicemail as directed and we will return your call as soon as possible. Messages left after 4 pm will be answered the following business day.   You may also send [place] a message via MyChart. We typically respond to MyChart messages within 1-2 business days.  For prescription refills, please ask your pharmacy to contact our office. Our fax number is  [phone].  If you have an urgent issue when the clinic is closed that cannot wait until the next business day, you can page your doctor at the number below.    Please note that while we do our best to be available for urgent issues outside of office hours, we are not available 24/7.   If you have an urgent issue and are unable to reach [place], you may choose to seek medical care at your doctor's office, retail clinic, urgent care center, or emergency room.  If you have a medical emergency, please immediately call 911 or go to the emergency department.  Pager Numbers  - Dr. Gwen Pounds: [phone]  - Dr. Roseanne Reno: [phone]  - Dr. Katrinka Blazing: [phone]   In the event of inclement weather, please call our main line at [phone] for an update on the status of any delays or closures.  Dermatology Medication Tips: Please keep the boxes that topical medications come in in order to help keep track of the instructions about where and how to use these. Pharmacies typically print the medication instructions only on the boxes and not directly on the medication tubes.   If your medication is too expensive, please contact our office at [phone] option 4 or send [place] a message through MyChart.   We are unable to tell what your co-pay for medications will be in advance as this is different depending on your insurance coverage. However, we may be able to find a substitute medication at lower cost or fill out paperwork to get insurance to cover a needed medication.   If a prior authorization is required to get your medication covered by your insurance company, please allow [place] 1-2 business days to complete this process.  Drug prices often vary depending on where the prescription is filled and some pharmacies may offer cheaper prices.  The website www.goodrx.com contains coupons for medications through different pharmacies. The prices here do not account for what the cost may be with help from  insurance (it may be cheaper with your insurance), but the website can give you the price if you did not use any insurance.  - You can print the associated coupon and take it with your prescription to the pharmacy.  - You may also stop by our office during regular business hours and pick up a GoodRx coupon card.  - If you need your prescription sent electronically to a different pharmacy, notify our office through [facility] or by phone at [phone] option 4.     Si Usted Necesita Algo Despus de Su Visita  Tambin puede enviarnos un mensaje a travs de Clinical cytogeneticist. Por lo general respondemos a los mensajes de MyChart en el transcurso de 1 a 2 das hbiles.  Para renovar recetas, por favor pida a su farmacia que se ponga en contacto con nuestra oficina. Annie Sable de fax es [place] [phone].  Si tiene un asunto urgente cuando la clnica est cerrada y que no puede esperar Teacher, adult education  el siguiente da hbil, puede llamar/localizar a su doctor(a) al nmero que aparece a continuacin.   Por favor, tenga en cuenta que aunque hacemos todo lo posible para estar disponibles para asuntos urgentes fuera del horario de [place], no estamos disponibles las 24 horas del da, los 7 [address]  [address] de la [place].   Si tiene un problema urgente y no puede comunicarse con nosotros, puede optar por buscar atencin mdica  en el consultorio de su doctor(a), en una clnica privada, en un centro de atencin urgente o en una sala de emergencias.  Si tiene Engineer, drilling, por favor llame inmediatamente al 911 o vaya a la sala de emergencias.  Nmeros de bper  - Dr. Gwen Pounds: [phone]  - Dra. Roseanne Reno: [phone]  - Dr. Katrinka Blazing: [phone]   En caso de inclemencias del tiempo, por favor llame a Lacy Duverney principal al [phone] para una actualizacin sobre el [place] de cualquier retraso o cierre.  Consejos para la medicacin en dermatologa: Por favor, guarde las cajas en las que vienen los  medicamentos de uso tpico para ayudarle a seguir las instrucciones sobre dnde y cmo usarlos. Las farmacias generalmente imprimen las instrucciones del medicamento slo en las cajas y no directamente en los tubos del [place].   Si su medicamento es muy caro, por favor, pngase en contacto con Rolm Gala llamando al [phone] y presione la opcin 4 o envenos un mensaje a travs de Clinical cytogeneticist.   No podemos decirle cul ser su copago por los medicamentos por adelantado ya que esto es diferente dependiendo de la cobertura de su seguro. Sin embargo, es posible que podamos encontrar un medicamento sustituto a Audiological scientist un formulario para que el seguro cubra el medicamento que se considera necesario.   Si se requiere una autorizacin previa para que su compaa de seguros [place] su medicamento, por favor permtanos de 1 a 2 das hbiles para completar [address]  Los precios de los medicamentos varan con frecuencia dependiendo del Environmental consultant de dnde se surte la receta y alguna farmacias pueden ofrecer precios ms baratos.  El sitio web www.goodrx.com tiene cupones para medicamentos de Health and safety inspector. Los precios aqu no tienen en cuenta lo que podra costar con la ayuda del seguro (puede ser ms barato con su seguro), pero el sitio web puede darle el precio si no utiliz Tourist information centre manager.  - Puede imprimir el cupn correspondiente y llevarlo con su receta a la farmacia.  - Tambin puede pasar por nuestra oficina durante el horario de atencin regular y Education officer, museum una tarjeta de cupones de GoodRx.  - Si necesita que su receta se enve electrnicamente a una farmacia diferente, informe a nuestra oficina a travs de MyChart de [place] o por telfono llamando al [phone] y presione la opcin 4.

## 2023-01-01 NOTE — Progress Notes (Signed)
Follow-Up Visit   Subjective  [name] is a 89 y.o. female who presents for the following: Skin Cancer Screening and Full Body Skin Exam  Hx of aks at face   The patient presents for Total-Body Skin Exam (TBSE) for skin cancer screening and mole check. The patient has spots, moles and lesions to be evaluated, some may be new or changing and the patient may have concern these could be cancer.    The following portions of the chart were reviewed this encounter and updated as appropriate: medications, allergies, medical history  Review of Systems:  No other skin or systemic complaints except as noted in HPI or Assessment and Plan.  Objective  Well appearing patient in no apparent distress; mood and affect are within normal limits.  A full examination was performed including scalp, head, eyes, ears, nose, lips, neck, chest, axillae, abdomen, back, buttocks, bilateral upper extremities, bilateral lower extremities, hands, feet, fingers, toes, fingernails, and toenails. All findings within normal limits unless otherwise noted below.   Relevant physical exam findings are noted in the Assessment and Plan.  right cheek x 1, mid dorsum nose x 1 (2) Erythematous thin papules/macules with gritty scale.     Assessment & Plan   SKIN CANCER SCREENING PERFORMED TODAY.  [place] Exam: red or purple papule at vermilion lip  Treatment Plan: Benign-appearing. Observe  Discussed BBL. Patient deferred.    ACTINIC DAMAGE - Chronic condition, secondary to cumulative UV/sun exposure - diffuse scaly erythematous macules with underlying dyspigmentation - Recommend daily broad spectrum sunscreen SPF 30+ to sun-exposed areas, reapply every 2 hours as needed.  - Staying in the shade or wearing long sleeves, sun glasses (UVA+UVB protection) and wide brim hats (4-inch brim around the entire circumference of the hat) are also recommended for sun protection.  - Call for new or changing  lesions.  Purpura - Chronic; persistent and recurrent.  Treatable, but not curable. - Violaceous macules and patches - Benign - Related to trauma, age, sun damage and/or use of blood thinners, chronic use of topical and/or oral steroids - Observe - Can use OTC arnica containing moisturizer such as Dermend Bruise Formula if desired - Call for worsening or other concerns   LENTIGINES, SEBORRHEIC KERATOSES, HEMANGIOMAS - Benign normal skin lesions - Benign-appearing - Call for any changes  MELANOCYTIC NEVI - Tan-brown and/or pink-flesh-colored symmetric macules and papules - Benign appearing on exam today - Observation - Call clinic for new or changing moles - Recommend daily use of broad spectrum spf 30+ sunscreen to sun-exposed areas.   Actinic keratosis (2) right cheek x 1, mid dorsum nose x 1  Actinic keratoses are precancerous spots that appear secondary to cumulative UV radiation exposure/sun exposure over time. They are chronic with expected duration over 1 year. A portion of actinic keratoses will progress to squamous cell carcinoma of the skin. It is not possible to reliably predict which spots will progress to skin cancer and so treatment is recommended to prevent development of skin cancer.  Recommend daily broad spectrum sunscreen SPF 30+ to sun-exposed areas, reapply every 2 hours as needed.  Recommend staying in the shade or wearing long sleeves, sun glasses (UVA+UVB protection) and wide brim hats (4-inch brim around the entire circumference of the hat). Call for new or changing lesions.  Destruction of lesion - right cheek x 1, mid dorsum nose x 1 (2) Complexity: simple   Destruction method: cryotherapy   Informed consent: discussed and consent obtained  Timeout:  patient name, date of birth, surgical site, and procedure verified Lesion destroyed using liquid nitrogen: Yes   Region frozen until ice ball extended beyond lesion: Yes   Outcome: patient tolerated  procedure well with no complications   Post-procedure details: wound care instructions given     Return if symptoms worsen or fail to improve.  IAsher Muir, CMA, am acting as scribe for Armida Sans, MD.   Documentation: I have reviewed the above documentation for accuracy and completeness, and I agree with the above.  Armida Sans, MD

## 2023-01-04 ENCOUNTER — Encounter: Payer: Medicare PPO | Admitting: Dermatology

## 2023-01-08 ENCOUNTER — Encounter: Payer: Self-pay | Admitting: Dermatology

## 2023-08-16 ENCOUNTER — Ambulatory Visit (INDEPENDENT_AMBULATORY_CARE_PROVIDER_SITE_OTHER)

## 2023-08-16 ENCOUNTER — Ambulatory Visit
Admission: EM | Admit: 2023-08-16 | Discharge: 2023-08-16 | Disposition: A | Discharge status: Home / Self Care | Attending: Emergency Medicine | Admitting: Emergency Medicine

## 2023-08-16 DIAGNOSIS — L84 Corns and callosities: Secondary | ICD-10-CM

## 2023-08-16 DIAGNOSIS — L539 Erythematous condition, unspecified: Secondary | ICD-10-CM

## 2023-08-16 DIAGNOSIS — R030 Elevated blood-pressure reading, without diagnosis of hypertension: Secondary | ICD-10-CM | POA: Diagnosis not present

## 2023-08-16 LAB — GLUCOSE, CAPILLARY: Glucose-Capillary: 128 mg/dL — ABNORMAL HIGH (ref 70–99)

## 2023-08-16 MED ORDER — MUPIROCIN 2 % EX OINT: 1.0000 | TOPICAL_OINTMENT | Freq: Two times a day (BID) | CUTANEOUS | 0 refills | Status: AC

## 2023-08-16 NOTE — Discharge Instructions (Addendum)
 No osteomyelitis or gas noted. Most likely this is a corn/callus. Apply mupirocin  as directed, can cover w dressing,can use bunion/corn pad to reduce friction.  Keep appt with Dr. Ashley as scheduled Follow up with PCP regarding recheck of BP as it was elevated today Take home meds as prescribed. Return as needed

## 2023-08-16 NOTE — ED Provider Notes (Signed)
 [facility]    CSN: [id number] Arrival date & time: 08/16/23  0909      History   Chief Complaint Chief Complaint  Patient presents with   Toe Pain    HPI [name] is a 89 y.o. female.   89 year old female, [name], presents to urgent care for evaluation of sore to left foot/ 2nd toe  x 3 weeks. Pt reports wearing socks and shoes,no known injury. 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 +localized erythema with scab noted  PMH: Diabetes, chronic opiates(fentanyl  patch)   The history is provided by the patient. No language interpreter was used.    Past Medical History:  Diagnosis Date   Acute esophagitis    E. coli sepsis (HCC) 08/18/2019   GERD (gastroesophageal reflux disease)    Hemorrhoids 06/18/2018   High cholesterol    Hypertension    Ileus (HCC) 06/02/2018   Long term prescription opiate use 08/22/2017   Protrusion of musculoskeletal implant (HCC)    Rods placed in back protrude (mid-back)   Uses walker    Vertigo    long ago.  no current issues   Vertigo    1 episode approx 2015   Weakness of left foot    Wears dentures    full upper   Wears hearing aid in both ears     Patient Active Problem List   Diagnosis Date Noted   Toe erythema 08/16/2023   Corn or callus 08/16/2023   Elevated blood pressure reading 08/16/2023   Gastrojejunal anastomotic stricture    History of bypass gastrojejunostomy 02/27/2019   Arthritis 06/18/2018   Diabetes mellitus type 2, uncomplicated (HCC) 06/18/2018   Diabetic nephropathy (HCC) 06/18/2018   Hypertension 06/18/2018   Gastric outlet obstruction    Stricture of bowel (HCC)    Lumbar spondylosis with myelopathy 08/22/2017   Chronic pain 07/21/2013    Past Surgical History:  Procedure Laterality Date   ABDOMINAL SURGERY     BACK SURGERY     BIOPSY N/A 04/08/2019   Procedure: BIOPSY;  Surgeon: Unk Corinn Skiff, MD;  Location: [facility];  Service: Endoscopy;   Laterality: N/A;   CATARACT EXTRACTION W/PHACO Left 05/10/2021   Procedure: CATARACT EXTRACTION PHACO AND INTRAOCULAR LENS PLACEMENT (IOC) LEFT 12.49 01:08.6;  Surgeon: Jaye Fallow, MD;  Location: [facility];  Service: Ophthalmology;  Laterality: Left;   CHOLECYSTECTOMY     ESOPHAGOGASTRODUODENOSCOPY N/A 06/04/2018   Procedure: ESOPHAGOGASTRODUODENOSCOPY (EGD);  Surgeon: Janalyn Keene NOVAK, MD;  Location: [facility] END[facility];  Service: Endoscopy;  Laterality: N/A;   ESOPHAGOGASTRODUODENOSCOPY N/A 06/05/2018   Procedure: ESOPHAGOGASTRODUODENOSCOPY (EGD);  Surgeon: Jinny Carmine, MD;  Location: [facility] END[facility];  Service: Endoscopy;  Laterality: N/A;  use Fluoro bed, X-ray is aware   ESOPHAGOGASTRODUODENOSCOPY N/A 02/28/2019   Procedure: ESOPHAGOGASTRODUODENOSCOPY (EGD);  Surgeon: Janalyn Keene NOVAK, MD;  Location: [facility];  Service: Endoscopy;  Laterality: N/A;   ESOPHAGOGASTRODUODENOSCOPY (EGD) WITH PROPOFOL  N/A 04/08/2019   Procedure: ESOPHAGOGASTRODUODENOSCOPY (EGD) WITH PROPOFOL ;  Surgeon: Unk Corinn Skiff, MD;  Location: [facility];  Service: Endoscopy;  Laterality: N/A;  priority 4   JOINT REPLACEMENT     REPLACEMENT TOTAL KNEE Right    TOE AMPUTATION Left    great toe   TONSILLECTOMY      OB History   No obstetric history on file.      Home Medications    Prior to Admission medications   Medication Sig Start Date End Date Taking? Authorizing  Provider  acetaminophen  (TYLENOL ) 500 MG tablet Take 500 mg by mouth every 6 (six) hours as needed.   Yes [provider]  atorvastatin  (LIPITOR) 20 MG tablet Take by mouth. 09/15/20  Yes [provider]  Cyanocobalamin  (VITAMIN B-12 PO) Take 1 tablet by mouth daily.    Yes [provider]  Dimethicone-Zinc Oxide-Vit A-D (A & D ZINC OXIDE) CREA Apply topically. 11/26/19  Yes [provider]  fentaNYL  (DURAGESIC ) 100 MCG/HR Place onto the skin. 10/01/20  Yes [provider]   folic acid  (FOLVITE ) 1 MG tablet Take 1 mg by mouth daily.  04/10/18  Yes [provider]  Multiple Vitamin (MULTIVITAMIN) tablet Take 1 tablet by mouth daily.   Yes [provider]  mupirocin  ointment (BACTROBAN ) 2 % Apply 1 Application topically 2 (two) times daily for 7 days. Left second toe 08/16/23 08/23/23 Yes Jearld Hemp, Rilla, NP  pantoprazole  (PROTONIX ) 40 MG tablet Take 1 tablet by mouth 2 (two) times daily. 09/15/20  Yes [provider]  polyethylene glycol (MIRALAX / GLYCOLAX) 17 g packet Take 17 g by mouth daily as needed.   Yes [provider]    Family History Family History  Problem Relation Age of Onset   Heart attack Mother    Heart attack Father     Social History Social History   Tobacco Use   Smoking status: Never   Smokeless tobacco: Never  Vaping Use   Vaping status: Never Used  Substance Use Topics   Alcohol use: Never   Drug use: Never     Allergies   Colesevelam, Lovastatin, and Tetracyclines & related   Review of Systems Review of Systems  Constitutional:  Negative for fever.  Skin:  Positive for color change and wound.  All other systems reviewed and are negative.    Physical Exam Triage Vital Signs ED Triage Vitals  Encounter Vitals Group     BP      Girls Systolic BP Percentile      Girls Diastolic BP Percentile      Boys Systolic BP Percentile      Boys Diastolic BP Percentile      Pulse      Resp      Temp      Temp src      SpO2      Weight      Height      Head Circumference      Peak Flow      Pain Score      Pain Loc      Pain Education      Exclude from Growth Chart    No data found.  Updated Vital Signs BP (!) 190/89 (BP Location: Right Arm)   Pulse 74   Temp 98.5 F (36.9 C) (Oral)   Resp 16   SpO2 95%   Visual Acuity Right Eye Distance:   Left Eye Distance:   Bilateral Distance:    Right Eye Near:   Left Eye Near:    Bilateral Near:     Physical Exam Vitals and  nursing note reviewed.   Cardiovascular:     Pulses:          Dorsalis pedis pulses are 2+ on the left side.   Musculoskeletal:       Feet:      UC Treatments / Results  Labs (all labs ordered are listed, but only abnormal results are displayed) Labs Reviewed  GLUCOSE, CAPILLARY - Abnormal;  Notable for the following components:      Result Value   Glucose-Capillary 128 (*)    All other components within normal limits  CBG MONITORING, ED    EKG   Radiology DG Toe 2nd Left Result Date: 08/16/2023 CLINICAL DATA:  Second toe wound and pain for 3 weeks. EXAM: LEFT SECOND TOE COMPARISON:  June 06, 2007. FINDINGS: There is no evidence of fracture or dislocation. There is no evidence of arthropathy. 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 Soft tissues are unremarkable. IMPRESSION: No acute abnormality seen. Electronically Signed   By: Lynwood Landy Raddle M.D.   On: 08/16/2023 10:32    Procedures Procedures (including critical care time)  Medications Ordered in UC Medications - No data to display  Initial Impression / Assessment and Plan / UC Course  I have reviewed the triage vital signs and the nursing notes.  Pertinent labs & imaging results that were available during my care of the patient were reviewed by me and considered in my medical decision making (see chart for details).  Clinical Course as of 08/16/23 1118  Thu Aug 16, 2023  0936 Cbg 128 in office [JD]  [phone] Left 2nd toe ordered for r/o osteomyelitis due to previous  [JD]    Clinical Course User Index [JD] Adler Chartrand, Rilla, NP   Discussed exam findings and plan of care with patient, mupirocin  ointment scripted, strict go to ER precautions given.   Patient verbalized understanding to this provider.  Ddx: Corn/callus to left second toe, skin infection, osteomyelitis Final Clinical Impressions(s) / UC Diagnoses   Final diagnoses:  Toe erythema  Corn or callus   Elevated blood pressure reading     Discharge Instructions      No osteomyelitis or gas noted. Most likely this is a corn/callus. Apply mupirocin  as directed, can cover w dressing,can use bunion/corn pad to reduce friction.  Keep appt with Dr. Ashley as scheduled Follow up with PCP regarding recheck of BP as it was elevated today Take home meds as prescribed. Return as needed       ED Prescriptions     Medication Sig Dispense Auth. Provider   mupirocin  ointment (BACTROBAN ) 2 % Apply 1 Application topically 2 (two) times daily for 7 days. Left second toe 14 g Rex Magee, NP      PDMP not reviewed this encounter.   Aminta Rilla, NP 08/16/23 1118

## 2023-08-16 NOTE — ED Triage Notes (Signed)
 Sx x 3 week.  Patient has a sore on her left 2nd toe.
